# Patient Record
Sex: Female | Born: 1960 | Race: White | Hispanic: No | Marital: Married | Smoking: Current every day smoker
Health system: Southern US, Community
[De-identification: ages and names within clinical notes are randomized; demographics above are authoritative.]

## PROBLEM LIST (undated history)

## (undated) DIAGNOSIS — R112 Nausea with vomiting, unspecified: Secondary | ICD-10-CM

## (undated) DIAGNOSIS — Z9889 Other specified postprocedural states: Secondary | ICD-10-CM

## (undated) DIAGNOSIS — K219 Gastro-esophageal reflux disease without esophagitis: Secondary | ICD-10-CM

## (undated) DIAGNOSIS — K2210 Ulcer of esophagus without bleeding: Secondary | ICD-10-CM

---

## 1981-09-09 HISTORY — PX: CERVICAL CONIZATION W/BX: SHX1330

## 2001-04-08 ENCOUNTER — Ambulatory Visit (HOSPITAL_COMMUNITY): Admission: RE | Admit: 2001-04-08 | Discharge: 2001-04-08 | Payer: Self-pay | Admitting: Specialist

## 2001-04-08 ENCOUNTER — Encounter: Payer: Self-pay | Admitting: Specialist

## 2001-04-16 ENCOUNTER — Encounter: Payer: Self-pay | Admitting: Specialist

## 2001-04-16 ENCOUNTER — Ambulatory Visit (HOSPITAL_COMMUNITY): Admission: RE | Admit: 2001-04-16 | Discharge: 2001-04-16 | Payer: Self-pay | Admitting: Specialist

## 2001-04-22 ENCOUNTER — Other Ambulatory Visit: Admission: RE | Admit: 2001-04-22 | Discharge: 2001-04-22 | Payer: Self-pay | Admitting: Specialist

## 2002-04-19 ENCOUNTER — Ambulatory Visit (HOSPITAL_COMMUNITY): Admission: RE | Admit: 2002-04-19 | Discharge: 2002-04-19 | Payer: Self-pay | Admitting: Specialist

## 2002-04-19 ENCOUNTER — Encounter: Payer: Self-pay | Admitting: Specialist

## 2003-04-21 ENCOUNTER — Encounter: Payer: Self-pay | Admitting: Obstetrics & Gynecology

## 2003-04-21 ENCOUNTER — Ambulatory Visit (HOSPITAL_COMMUNITY): Admission: RE | Admit: 2003-04-21 | Discharge: 2003-04-21 | Payer: Self-pay | Admitting: Obstetrics & Gynecology

## 2003-05-24 ENCOUNTER — Emergency Department (HOSPITAL_COMMUNITY): Admission: EM | Admit: 2003-05-24 | Discharge: 2003-05-24 | Payer: Self-pay | Admitting: Emergency Medicine

## 2004-04-24 ENCOUNTER — Ambulatory Visit (HOSPITAL_COMMUNITY): Admission: RE | Admit: 2004-04-24 | Discharge: 2004-04-24 | Payer: Self-pay | Admitting: Obstetrics & Gynecology

## 2005-04-25 ENCOUNTER — Ambulatory Visit (HOSPITAL_COMMUNITY): Admission: RE | Admit: 2005-04-25 | Discharge: 2005-04-25 | Payer: Self-pay | Admitting: Obstetrics & Gynecology

## 2005-05-15 ENCOUNTER — Ambulatory Visit (HOSPITAL_COMMUNITY): Admission: RE | Admit: 2005-05-15 | Discharge: 2005-05-15 | Payer: Self-pay | Admitting: Obstetrics & Gynecology

## 2005-07-29 ENCOUNTER — Ambulatory Visit (HOSPITAL_COMMUNITY): Admission: RE | Admit: 2005-07-29 | Discharge: 2005-07-29 | Payer: Self-pay | Admitting: Family Medicine

## 2006-05-16 ENCOUNTER — Encounter: Admission: RE | Admit: 2006-05-16 | Discharge: 2006-05-16 | Payer: Self-pay | Admitting: Obstetrics & Gynecology

## 2007-05-28 ENCOUNTER — Ambulatory Visit (HOSPITAL_COMMUNITY): Admission: RE | Admit: 2007-05-28 | Discharge: 2007-05-28 | Payer: Self-pay | Admitting: Obstetrics & Gynecology

## 2007-06-08 ENCOUNTER — Other Ambulatory Visit: Admission: RE | Admit: 2007-06-08 | Discharge: 2007-06-08 | Payer: Self-pay | Admitting: Obstetrics & Gynecology

## 2008-05-31 ENCOUNTER — Ambulatory Visit (HOSPITAL_COMMUNITY): Admission: RE | Admit: 2008-05-31 | Discharge: 2008-05-31 | Payer: Self-pay | Admitting: Obstetrics & Gynecology

## 2009-06-27 ENCOUNTER — Ambulatory Visit (HOSPITAL_COMMUNITY): Admission: RE | Admit: 2009-06-27 | Discharge: 2009-06-27 | Payer: Self-pay | Admitting: Obstetrics & Gynecology

## 2009-06-30 ENCOUNTER — Other Ambulatory Visit: Admission: RE | Admit: 2009-06-30 | Discharge: 2009-06-30 | Payer: Self-pay | Admitting: Obstetrics & Gynecology

## 2010-11-19 ENCOUNTER — Encounter (HOSPITAL_COMMUNITY): Payer: Self-pay

## 2010-11-19 ENCOUNTER — Ambulatory Visit (HOSPITAL_COMMUNITY)
Admission: RE | Admit: 2010-11-19 | Discharge: 2010-11-19 | Disposition: A | Discharge status: Home / Self Care | Payer: BC Managed Care – PPO | Source: Ambulatory Visit | Attending: Family Medicine | Admitting: Family Medicine

## 2010-11-19 ENCOUNTER — Other Ambulatory Visit (HOSPITAL_COMMUNITY): Payer: Self-pay | Admitting: Family Medicine

## 2010-11-19 DIAGNOSIS — R109 Unspecified abdominal pain: Secondary | ICD-10-CM | POA: Insufficient documentation

## 2010-11-19 DIAGNOSIS — R140 Abdominal distension (gaseous): Secondary | ICD-10-CM

## 2010-11-19 DIAGNOSIS — R142 Eructation: Secondary | ICD-10-CM | POA: Insufficient documentation

## 2010-11-19 DIAGNOSIS — R141 Gas pain: Secondary | ICD-10-CM | POA: Insufficient documentation

## 2011-01-03 ENCOUNTER — Ambulatory Visit (INDEPENDENT_AMBULATORY_CARE_PROVIDER_SITE_OTHER): Payer: BC Managed Care – PPO | Admitting: Internal Medicine

## 2011-01-03 DIAGNOSIS — R1031 Right lower quadrant pain: Secondary | ICD-10-CM

## 2011-01-11 ENCOUNTER — Ambulatory Visit (HOSPITAL_COMMUNITY)
Admission: RE | Admit: 2011-01-11 | Discharge: 2011-01-11 | Disposition: A | Discharge status: Home / Self Care | Payer: BC Managed Care – PPO | Source: Ambulatory Visit | Attending: Internal Medicine | Admitting: Internal Medicine

## 2011-01-11 ENCOUNTER — Other Ambulatory Visit (INDEPENDENT_AMBULATORY_CARE_PROVIDER_SITE_OTHER): Payer: Self-pay | Admitting: Internal Medicine

## 2011-01-11 ENCOUNTER — Encounter (HOSPITAL_BASED_OUTPATIENT_CLINIC_OR_DEPARTMENT_OTHER): Payer: BC Managed Care – PPO | Admitting: Internal Medicine

## 2011-01-11 DIAGNOSIS — K2940 Chronic atrophic gastritis without bleeding: Secondary | ICD-10-CM | POA: Insufficient documentation

## 2011-01-11 DIAGNOSIS — R141 Gas pain: Secondary | ICD-10-CM

## 2011-01-11 DIAGNOSIS — R933 Abnormal findings on diagnostic imaging of other parts of digestive tract: Secondary | ICD-10-CM | POA: Insufficient documentation

## 2011-01-11 DIAGNOSIS — K2960 Other gastritis without bleeding: Secondary | ICD-10-CM

## 2011-01-11 DIAGNOSIS — K449 Diaphragmatic hernia without obstruction or gangrene: Secondary | ICD-10-CM

## 2011-02-04 NOTE — Consult Note (Signed)
NAMENELY, DEDMON                 ACCOUNT NO.:  [id number]  MEDICAL RECORD NO.:  [id number]           PATIENT TYPE:  LOCATION:                                 FACILITY:  PHYSICIAN:  Lionel December, M.D.    DATE OF BIRTH:  1960-10-12  DATE OF CONSULTATION:  01/03/2011 DATE OF DISCHARGE:                                CONSULTATION   REASON FOR CONSULTATION:  Distended stomach.  HISTORY OF PRESENT ILLNESS:  [name] is a 50 year old female referred to our office by [facility].  She gives a history of seeing a chiropractor on November 08, 2010, and x-rays were taken.  She said she did have an abnormal x-ray.  The x-ray evidently showed a significant gastric distention.  There was a question of gastroparesis or partial gastric outlet obstruction.  The bowel gas pattern was otherwise normal. She says her appetite has been good.  There has been no weight loss.  No abdominal pain.  She does pass a lot of gas.  She normally has one bowel movement a day but now she is having a bowel movement every other day. She does say some of her stools are small.  She denies any melena or bright red rectal bleeding.  There has been no nausea or vomiting.  No weight loss.  No fatigue.  She does say at times, her stomach is distended.  MEDICATIONS:  Her medications include fish oil 1000 mg one a day, vitamin C 500 a day, antacid as needed, Colon Health one a day, vitamin D3 2000 mg a day, calcium 600 mg a day,  Allergy Relief as needed, nasal spray as needed, Dulcolax p.r.n., milk of magnesia p.r.n., and Aleve p.r.n.  ALLERGIES:  There is no known allergies.  PAST SURGICAL HISTORY:  In 1987, she had a conization of the cervix for precancerous lesions.  No medical history.  FAMILY HISTORY:  Her father is 62 years old and in good health.  Her mother is alive with a CVA and a cardiac stent, one sister in good health.  SOCIAL HISTORY:  She is married.  She works at __________ company  in [place].  She smokes one pack a day for 30 years.  No children.  OBJECTIVE:  VITAL SIGNS:  Her weight is 124, height 5 feet 3 inches, temperature 98.7, blood pressure 130/82, and her pulse is 82.  Her last documented weight in March 2012 was 122, so she has gained 2 pounds. HEENT:  She has natural teeth.  Her oral mucosa is moist.  There are no lesions.  Conjunctivae are pink.  Sclerae anicteric. NECK:  Thyroid is normal.  There is no cervical lymphadenopathy. LUNGS:  Clear. HEART:  Regular rate and rhythm. ABDOMEN:  Soft and nondistended.  Bowel sounds positive.  No masses.  LABORATORY DATA:  Her abdominal one view x-ray revealed there was rather marked gastric distention.  This raises the question of gastroparesis or partial gastric outlet obstruction.  The bowel gas pattern was otherwise normal. The impression was significant gastric distention, otherwise unremarkable.  Her sodium on November 19, 2010, of 144, potassium 3.9,  chloride 106, glucose 96, BUN 10, creatinine 0.7, total bilirubin 0.4, ALP 55, AST 19, ALT 8, total protein 6.8, albumin 4.6, hemoglobin 15.2.  She is a smoker hematocrit is 45.1, WBC count is elevated at 13.5, granulocytes 79, absolute granulocytes 10.7.  ASSESSMENT:  Madison Walsh is a 50 year old female presenting today with an abnormal x-ray which showed significant gastric distention.  A partial gastric outlet obstruction needs to be ruled out as well as gastroparesis.  I did discuss this case with Dr. Karilyn Cota, and he would like to proceed with an EGD.    ______________________________ Dorene Ar, NP   ______________________________ Lionel December, M.D.    TS/MEDQ  D:  01/03/2011  T:  01/04/2011  Job:  [id number]  cc:   Dr. [facility]  Electronically Signed by Dorene Ar PA on 01/10/2011 09:27:29 AM Electronically Signed by Lionel December M.D. on 02/04/2011 05:39:59 PM

## 2011-02-04 NOTE — Op Note (Signed)
NAMEHONORE, WIPPERFURTH                 ACCOUNT NO.:  [id number]  MEDICAL RECORD NO.:  [id number]           PATIENT TYPE:  O  LOCATION:  [facility]                          FACILITY:  [facility]  PHYSICIAN:  Lionel December, M.D.    DATE OF BIRTH:  02-Jan-1961  DATE OF PROCEDURE:  01/11/2011 DATE OF DISCHARGE:                              OPERATIVE REPORT   PROCEDURE:  Esophagogastroduodenoscopy.  INDICATIONS:  Madison Walsh is 50 year old Caucasian female with recurrent postprandial bloating who had abdominal CT which revealed very dilated stomach.  She is therefore referred for further evaluation.  She denies abdominal pain.  She has had problems with constipation, but she is able to control with diet, fiber supplement, and p.r.n. use of laxatives. She has heartburn, but not very frequently.  She is undergoing diagnostic EGD.  Procedures were reviewed with the patient.  Informed consent was obtained.  MEDS FOR CONSCIOUS SEDATION:  Cetacaine spray for oropharyngeal topical anesthesia, Demerol 25 mg IV, Versed 6 mg IV.  FINDINGS:  Procedure performed in endoscopy suite.  The patient's vital signs and O2 sat were monitored during the procedure and remained stable.  The patient was placed in left lateral recumbent position and Pentax videoscope was passed through oropharynx without any difficulty into esophagus.  Esophagus.  Mucosa of the proximal middle third was normal.  Distally, there were 3 linear erosion extending to GE junction, one of these erosions about a centimeter long, another one was even longer, and the third one was small.  No ring or stricture was noted.  GE junction was at 34 cm from the incisors and hiatus was at 36.  Stomach.  It was empty and distended very well by insufflation.  Folds of proximal stomach are normal.  Examination of mucosa at body was normal.  In the antrum, there was some patchy erythema, small scar towards lesser curvature.  In the prepyloric region, there was  heaping up of mucosa with almost with appearance of polyp with a scar on the site.  This process extended to the right side of the pyloric channel, but pyloric channel was patent.  Biopsy was taken from this area. Angularis, fundus, and cardia were examined by retroflexion of scope and were normal.  Duodenum.  Bulbar mucosa was normal.  Scope was passed in second part of duodenal mucosa and folds were normal.  Endoscope was withdrawn.  The patient tolerated the procedure well.  FINAL DIAGNOSES: 1. Erosive reflux esophagitis with 2-cm size sliding hiatal hernia. 2. Antral gastritis with a scar and mucosal edema and scarring in the     prepyloric region with appearance of polyp.  This area was     biopsied. 3. Endoscopically, appears to be benign process.  RECOMMENDATIONS: 1. Antireflux measures. 2. We will start the patient on pantoprazole 40 mg p.o. q.a.m.     prescription given for 30 with 5 refills. 3. I will be contacting patient with results of biopsy and further     recommendations.  If this special stains were negative for H.    pylori, we will follow it up with H. pylori serology.  ______________________________ Lionel December, M.D.     NR/MEDQ  D:  01/11/2011  T:  01/11/2011  Job:  [id number]  cc:   Madison Walsh. Sherwood Gambler, MD Fax: [phone]  Electronically Signed by Lionel December M.D. on 02/04/2011 05:40:36 PM

## 2011-04-03 ENCOUNTER — Encounter (INDEPENDENT_AMBULATORY_CARE_PROVIDER_SITE_OTHER): Payer: Self-pay | Admitting: *Deleted

## 2011-04-03 ENCOUNTER — Telehealth (INDEPENDENT_AMBULATORY_CARE_PROVIDER_SITE_OTHER): Payer: Self-pay | Admitting: *Deleted

## 2011-04-03 ENCOUNTER — Other Ambulatory Visit (INDEPENDENT_AMBULATORY_CARE_PROVIDER_SITE_OTHER): Payer: Self-pay | Admitting: *Deleted

## 2011-04-03 DIAGNOSIS — Z800 Family history of malignant neoplasm of digestive organs: Secondary | ICD-10-CM

## 2011-04-03 DIAGNOSIS — Z139 Encounter for screening, unspecified: Secondary | ICD-10-CM

## 2011-04-03 NOTE — Telephone Encounter (Signed)
Madison Walsh called with a progress report. States that she had a EGD 8 weeks ago , and she was told that she had GERD, several ulcers in her esophagus. She says that she cannot tell that she is that much better,her stomach wakes her up due to the pain that she has. Currently taking Pantoprazole 40 mg 1 every morning. She also ask about having a TCS done as she is 50 years of age, I did transfer her to [place] to address.

## 2011-04-03 NOTE — Telephone Encounter (Signed)
TCS sch'd 06/27/11 @ 12:00 (11:00), pt aware, movi prep instr mailed to pt

## 2011-04-16 NOTE — Telephone Encounter (Signed)
Patient is scheduled for colonoscopy; We'll reassess her symptoms at that time.

## 2011-06-25 ENCOUNTER — Other Ambulatory Visit (INDEPENDENT_AMBULATORY_CARE_PROVIDER_SITE_OTHER): Payer: Self-pay | Admitting: *Deleted

## 2011-06-25 DIAGNOSIS — Z1211 Encounter for screening for malignant neoplasm of colon: Secondary | ICD-10-CM

## 2011-06-25 DIAGNOSIS — Z800 Family history of malignant neoplasm of digestive organs: Secondary | ICD-10-CM

## 2011-06-26 MED ORDER — SODIUM CHLORIDE 0.45 % IV SOLN
Freq: Once | INTRAVENOUS | Status: AC
  Administered 2011-06-27: 12:00:00 via INTRAVENOUS

## 2011-06-27 ENCOUNTER — Encounter (HOSPITAL_COMMUNITY): Payer: Self-pay | Admitting: *Deleted

## 2011-06-27 ENCOUNTER — Ambulatory Visit (HOSPITAL_COMMUNITY)
Admission: RE | Admit: 2011-06-27 | Discharge: 2011-06-27 | Disposition: A | Discharge status: Home / Self Care | Payer: BC Managed Care – PPO | Source: Ambulatory Visit | Attending: Internal Medicine | Admitting: Internal Medicine

## 2011-06-27 ENCOUNTER — Other Ambulatory Visit (INDEPENDENT_AMBULATORY_CARE_PROVIDER_SITE_OTHER): Payer: Self-pay | Admitting: Internal Medicine

## 2011-06-27 ENCOUNTER — Encounter (HOSPITAL_COMMUNITY)
Admission: RE | Disposition: A | Discharge status: Home / Self Care | Payer: Self-pay | Source: Ambulatory Visit | Attending: Internal Medicine

## 2011-06-27 DIAGNOSIS — D126 Benign neoplasm of colon, unspecified: Secondary | ICD-10-CM | POA: Insufficient documentation

## 2011-06-27 DIAGNOSIS — Z800 Family history of malignant neoplasm of digestive organs: Secondary | ICD-10-CM

## 2011-06-27 DIAGNOSIS — Z1211 Encounter for screening for malignant neoplasm of colon: Secondary | ICD-10-CM

## 2011-06-27 HISTORY — PX: COLONOSCOPY: SHX5424

## 2011-06-27 HISTORY — DX: Nausea with vomiting, unspecified: R11.2

## 2011-06-27 HISTORY — DX: Nausea with vomiting, unspecified: Z98.890

## 2011-06-27 HISTORY — DX: Gastro-esophageal reflux disease without esophagitis: K21.9

## 2011-06-27 SURGERY — COLONOSCOPY
Anesthesia: Moderate Sedation

## 2011-06-27 MED ORDER — MEPERIDINE HCL 50 MG/ML IJ SOLN
INTRAMUSCULAR | Status: DC | PRN
Start: 2011-06-27 — End: 2011-06-27
  Administered 2011-06-27 (×2): 25 mg

## 2011-06-27 MED ORDER — MIDAZOLAM HCL 5 MG/5ML IJ SOLN
INTRAMUSCULAR | Status: AC
  Filled 2011-06-27: qty 10

## 2011-06-27 MED ORDER — MIDAZOLAM HCL 5 MG/5ML IJ SOLN
INTRAMUSCULAR | Status: DC | PRN
  Administered 2011-06-27: 2 mg via INTRAVENOUS
  Administered 2011-06-27 (×2): 1 mg via INTRAVENOUS
  Administered 2011-06-27: 2 mg via INTRAVENOUS
  Administered 2011-06-27: 1 mg via INTRAVENOUS

## 2011-06-27 MED ORDER — MEPERIDINE HCL 50 MG/ML IJ SOLN
INTRAMUSCULAR | Status: AC
  Filled 2011-06-27: qty 1

## 2011-06-27 NOTE — H&P (Signed)
Madison Walsh is an 50 y.o. female.   Chief Complaint: Patient is here for colonoscopy HPI: Patient is 50 year old Caucasian female who is in for average risk screening colonoscopy. She has chronic constipation which is controlled with fiber supplement and when necessary use of OTC laxatives. She denies melena or rectal bleeding or abdominal pain. Family history is positive for colon carcinoma in a paternal uncle who died of soon after  being diagnosed at age 34 of metastatic disease   Past Medical History  Diagnosis Date  . GERD (gastroesophageal reflux disease)   . Constipation   . PONV (postoperative nausea and vomiting)     Past Surgical History  Procedure Date  . Cervical conization w/bx 1983    Family History  Problem Relation Age of Onset  . Colon cancer Maternal Uncle    Social History:  reports that she has been smoking Cigarettes.  She has a 30 pack-year smoking history. She does not have any smokeless tobacco history on file. She reports that she does not drink alcohol or use illicit drugs.  Allergies: No Known Allergies  Medications Prior to Admission  Medication Dose Route Frequency Provider Last Rate Last Dose  . 0.45 % sodium chloride infusion   Intravenous Once Malissa Hippo, MD      . meperidine (DEMEROL) 50 MG/ML injection           . midazolam (VERSED) 5 MG/5ML injection            Medications Prior to Admission  Medication Sig Dispense Refill  . magnesium hydroxide (MILK OF MAGNESIA) 400 MG/5ML suspension Take 15 mLs by mouth daily as needed. For constipation       . pantoprazole (PROTONIX) 40 MG tablet Take 40 mg by mouth daily.        . naproxen sodium (ANAPROX) 220 MG tablet Take 220 mg by mouth every 8 (eight) hours as needed. For pain         No results found for this or any previous visit (from the past 48 hour(s)). No results found.  Review of Systems  Constitutional: Negative for weight loss.  Gastrointestinal: Negative for abdominal pain,  diarrhea, constipation, blood in stool and melena.    Blood pressure 122/62, pulse 67, temperature 98.6 F (37 C), temperature source Oral, resp. rate 22, height 5\' 3"  (1.6 m), weight 120 lb (54.432 kg), last menstrual period 06/21/2011, SpO2 99.00%. Physical Exam  Constitutional: She is oriented to person, place, and time. She appears well-developed and well-nourished.  HENT:  Mouth/Throat: Oropharynx is clear and moist.  Eyes: Conjunctivae are normal. No scleral icterus.  Neck: No thyromegaly present.  Cardiovascular: Normal rate, regular rhythm and normal heart sounds.   No murmur heard. Respiratory: Effort normal and breath sounds normal.  GI: Soft. Bowel sounds are normal. She exhibits no distension and no mass. There is no tenderness.  Musculoskeletal: She exhibits no edema.  Lymphadenopathy:    She has no cervical adenopathy.  Neurological: She is alert and oriented to person, place, and time.  Skin: Skin is warm and dry.     Assessment/Plan Average risk screening colonoscopy  REHMAN,NAJEEB U 06/27/2011, 11:54 AM

## 2011-06-27 NOTE — Op Note (Signed)
COLONOSCOPY PROCEDURE REPORT  PATIENT:  Madison Walsh  MR#:  [id number] Birthdate:  1961/05/14, 50 y.o., female Endoscopist:  Dr. Malissa Hippo, MD Referred By:  Roselyn Reef, PA, [facility] Procedure Date: 06/27/2011  Procedure:   Colonoscopy with snare polypectomy  Indications:  Patient is 50 year old Caucasian female who is here for average risk screening colonoscopy. She had a paternal uncle died of colon carcinoma at age 76 but she does not have a first degree relative with history of CRC.  Informed Consent:  Procedure and risks were reviewed with the patient and informed consent obtained  Medications:  Demerol 50 mg IV Versed 7 mg IV  Description of procedure:  After a digital rectal exam was performed, that colonoscope was advanced from the anus through the rectum and colon to the area of the cecum, ileocecal valve and appendiceal orifice. The cecum was deeply intubated. These structures were well-seen and photographed for the record. From the level of the cecum and ileocecal valve, the scope was slowly and cautiously withdrawn. The mucosal surfaces were carefully surveyed utilizing scope tip to flexion to facilitate fold flattening as needed. The scope was pulled down into the rectum where a thorough exam including retroflexion was performed.  Findings:   Prep excellent. Redundant and tortuous colon. 3 hyperplastic appearing polyps at splenic flexure which were coagulated using snare tip. 10 mm polyp at distal sigmoid colon. Was snared and retrieved for histologic examination Ano-rectal junction was unremarkable.  Therapeutic/Diagnostic Maneuvers Performed:  See above  Complications:  None  Cecal Withdrawal Time:  12 minutes  Impression:  Examination performed to cecum. Redundant  and tortuous colon. Three  small hyperplastic appearing polyps at splenic flexure were coagulated. 10 mm polyp snared from sigmoid colon.  Recommendations:  No aspirin for 10 days I  will be contacting patient with results of biopsy and further recommendations.  REHMAN,NAJEEB U  06/27/2011 12:50 PM  CC: Dr. Shawnie Dapper, PA, PA-C & Dr. Bonnetta Barry ref. provider found

## 2011-07-01 ENCOUNTER — Encounter (INDEPENDENT_AMBULATORY_CARE_PROVIDER_SITE_OTHER): Payer: Self-pay | Admitting: *Deleted

## 2011-07-04 ENCOUNTER — Encounter (HOSPITAL_COMMUNITY): Payer: Self-pay | Admitting: Internal Medicine

## 2013-03-04 ENCOUNTER — Telehealth (INDEPENDENT_AMBULATORY_CARE_PROVIDER_SITE_OTHER): Payer: Self-pay | Admitting: *Deleted

## 2013-03-04 NOTE — Telephone Encounter (Signed)
Virl Diamond has scheduled an apt for 03/08/13 with Dorene Ar, NP. He would like to know what they can do for constipated until her apt on Monday. The return phone number is [phone].

## 2013-03-04 NOTE — Telephone Encounter (Signed)
No BM in 5 days. Try a fleets enema. Hold as long as possible.

## 2013-03-08 ENCOUNTER — Ambulatory Visit (INDEPENDENT_AMBULATORY_CARE_PROVIDER_SITE_OTHER): Payer: BC Managed Care – PPO | Admitting: Internal Medicine

## 2013-03-08 ENCOUNTER — Encounter (INDEPENDENT_AMBULATORY_CARE_PROVIDER_SITE_OTHER): Payer: Self-pay | Admitting: Internal Medicine

## 2013-03-08 ENCOUNTER — Telehealth (INDEPENDENT_AMBULATORY_CARE_PROVIDER_SITE_OTHER): Payer: Self-pay | Admitting: Internal Medicine

## 2013-03-08 VITALS — BP 100/72 | HR 68 | Ht 63.0 in | Wt 122.1 lb

## 2013-03-08 DIAGNOSIS — K2210 Ulcer of esophagus without bleeding: Secondary | ICD-10-CM

## 2013-03-08 DIAGNOSIS — K5900 Constipation, unspecified: Secondary | ICD-10-CM | POA: Insufficient documentation

## 2013-03-08 NOTE — Telephone Encounter (Signed)
Called this am. States she feels better. Had fairly good BM. She is to be seen today in our office.

## 2013-03-08 NOTE — Progress Notes (Signed)
Subjective:     Patient ID: Madison Walsh, female   DOB: 03-06-1961, 52 y.o.   MRN: [id number]  HPI Presents today with c/o that she became constipated. Wednesday or Thursday of last week she could not burp, eat, or pass gas. She had not had a BM x 6 days. She had tried [employer] softener, MOM, and dulcolax without a BM. She took a Fleet's enema last Thursday and she had some results. She is presently not on a PPI. Was taking Protonix but has not taken for 2 yrs. By Saturday she had good results. She began to feel better. She had a BM yesterday and today.  She tells me she feels better. She says she feel acid reflux.  When she bends over she becomes nauseated.  Appetite is better.  2012 EGD: 1. Erosive reflux esophagitis with 2-cm size sliding hiatal hernia.  2. Antral gastritis with a scar and mucosal edema and scarring in the  prepyloric region with appearance of polyp. This area was  biopsied.  3. Endoscopically, appears to be benign process.     06/27/2011 Colonoscopy: Impression:  Examination performed to cecum.  Redundant and tortuous colon.  Three small hyperplastic appearing polyps at splenic flexure were coagulated.  10 mm polyp snared from sigmoid colon.  Review of Systems see hpi Current Outpatient Prescriptions  Medication Sig Dispense Refill  . magnesium hydroxide (MILK OF MAGNESIA) 400 MG/5ML suspension Take 15 mLs by mouth daily as needed. For constipation       . naproxen sodium (ANAPROX) 220 MG tablet Take 220 mg by mouth every 8 (eight) hours as needed. For pain        No current facility-administered medications for this visit.   Past Medical History  Diagnosis Date  . GERD (gastroesophageal reflux disease)   . Constipation   . PONV (postoperative nausea and vomiting)    Past Surgical History  Procedure Laterality Date  . Cervical conization w/bx  1983  . Colonoscopy  06/27/2011    Procedure: COLONOSCOPY;  Surgeon: Malissa Hippo, MD;  Location: [facility]  END[facility] SUITE;  Service: Endoscopy;  Laterality: N/A;  12:00   No Known Allergies      Objective:   Physical Exam  Filed Vitals:   03/08/13 1115  BP: 100/72  Pulse: 68  Height: 5\' 3"  (1.6 m)  Weight: 122 lb 1.6 oz (55.384 kg)  Alert and oriented. Skin warm and dry. Oral mucosa is moist.   . Sclera anicteric, conjunctivae is pink. Thyroid not enlarged. No cervical lymphadenopathy. Lungs clear. Heart regular rate and rhythm.  Abdomen is soft. Bowel sounds are positive. No hepatomegaly. No abdominal masses felt. No tenderness.  No edema to lower extremities        Assessment:    Constipation resolved.  Colonoscopy UTD   Erosive esophagitis: she has been off the Protonix for approximately 2 yrs. GERD not controlled at this time. Plan:   Dexilant samples x 2 boxes given to patient. PR in 2 weeks. If better, will e prescribe to her pharmacy. She needs to be on a PPI.

## 2013-03-08 NOTE — Patient Instructions (Addendum)
Dexilant samples given to patient. PR in 2 weeks. Must be on a PPI due to erosive esohpagitis.

## 2013-03-18 ENCOUNTER — Telehealth (INDEPENDENT_AMBULATORY_CARE_PROVIDER_SITE_OTHER): Payer: Self-pay | Admitting: *Deleted

## 2013-03-18 ENCOUNTER — Encounter (INDEPENDENT_AMBULATORY_CARE_PROVIDER_SITE_OTHER): Payer: Self-pay | Admitting: Internal Medicine

## 2013-03-18 NOTE — Telephone Encounter (Signed)
Was given Dexilant samples to try by Dorene Ar, NP. Her symptoms are better but no gone. Please ask Camelia Eng is she would like to keep her on this and if so she will need an Rx sent to her pharmacy. She will be out as of today. If not should she try Nexium? Her return phone number is [phone].

## 2013-03-19 NOTE — Telephone Encounter (Signed)
Tammy has spoken with Dr. Karilyn Cota and this has been addressed

## 2013-07-15 ENCOUNTER — Other Ambulatory Visit: Payer: Self-pay

## 2013-09-07 ENCOUNTER — Encounter (INDEPENDENT_AMBULATORY_CARE_PROVIDER_SITE_OTHER): Payer: Self-pay | Admitting: Internal Medicine

## 2013-09-07 ENCOUNTER — Ambulatory Visit (INDEPENDENT_AMBULATORY_CARE_PROVIDER_SITE_OTHER): Payer: BC Managed Care – PPO | Admitting: Internal Medicine

## 2013-09-07 VITALS — BP 102/50 | HR 72 | Temp 98.8°F | Ht 63.0 in | Wt 127.1 lb

## 2013-09-07 DIAGNOSIS — K219 Gastro-esophageal reflux disease without esophagitis: Secondary | ICD-10-CM

## 2013-09-07 DIAGNOSIS — K5900 Constipation, unspecified: Secondary | ICD-10-CM

## 2013-09-07 NOTE — Progress Notes (Signed)
Subjective:     Patient ID: Madison Walsh, female   DOB: Mar 16, 1961, 52 y.o.   MRN: [id number]  HPI Here today for f/u. She says she occasionally has acid reflux. She says it much better.  If she has GERD, she will take a Tums. For constipation she uses a stool softener on a daily. She usually has a BM daily. Appetite is good. No weight loss. No abdominal pain. No melena or bright red rectal bleeding.  She is trying to exercise.    2012 EGD:  1. Erosive reflux esophagitis with 2-cm size sliding hiatal hernia.  2. Antral gastritis with a scar and mucosal edema and scarring in the  prepyloric region with appearance of polyp. This area was  biopsied.  3. Endoscopically, appears to be benign process.  06/27/2011 Colonoscopy: Impression:  Examination performed to cecum.  Redundant and tortuous colon.  Three small hyperplastic appearing polyps at splenic flexure were coagulated.  10 mm polyp snared from sigmoid colon.  ------  Notes Recorded by Malissa Hippo, MD on 06/30/2011 at 1:21 PM Biopsy results reviewed with patient's husband. 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 Next colonoscopy would be in 5 years.   Review of Systems  See hpi Current Outpatient Prescriptions  Medication Sig Dispense Refill  . docusate sodium (COLACE) 100 MG capsule Take 100 mg by mouth daily.      . magnesium hydroxide (MILK OF MAGNESIA) 400 MG/5ML suspension Take 15 mLs by mouth daily as needed. For constipation       . naproxen sodium (ANAPROX) 220 MG tablet Take 220 mg by mouth every 8 (eight) hours as needed. For pain        No current facility-administered medications for this visit.   Past Medical History  Diagnosis Date  . GERD (gastroesophageal reflux disease)   . Constipation   . PONV (postoperative nausea and vomiting)    Past Surgical History  Procedure Laterality Date  . Cervical conization w/bx  1983  . Colonoscopy  06/27/2011    Procedure: COLONOSCOPY;  Surgeon: Malissa Hippo, MD;  Location:  [facility];  Service: Endoscopy;  Laterality: N/A;  12:00   No Known Allergies      Objective:   Physical Exam  Filed Vitals:   09/07/13 0927  BP: 102/50  Pulse: 72  Temp: 98.8 F (37.1 C)  Height: 5\' 3"  (1.6 m)  Weight: 127 lb 1.6 oz (57.652 kg)   Alert and oriented. Skin warm and dry. Oral mucosa is moist.   . Sclera anicteric, conjunctivae is pink. Thyroid not enlarged. No cervical lymphadenopathy. Lungs clear. Heart regular rate and rhythm.  Abdomen is soft. Bowel sounds are positive. No hepatomegaly. No abdominal masses felt. No tenderness.  No edema to lower extremities.       Assessment:    GERD. Controlled at this time. Tums prn. She avoids greasy foods.  She does have HOB elevated. Constipation: She uses a stool softener daily.    Plan:   OV in 1 yr with Dr. Karilyn Cota. Advised to increase fiber in diet. Exercise

## 2013-09-07 NOTE — Patient Instructions (Signed)
OV in 1 yr with Dr. Karilyn Cota. Keep HOB elevated

## 2014-06-24 ENCOUNTER — Other Ambulatory Visit: Payer: Self-pay

## 2014-06-29 ENCOUNTER — Encounter (INDEPENDENT_AMBULATORY_CARE_PROVIDER_SITE_OTHER): Payer: Self-pay | Admitting: *Deleted

## 2014-09-27 ENCOUNTER — Encounter (INDEPENDENT_AMBULATORY_CARE_PROVIDER_SITE_OTHER): Payer: Self-pay | Admitting: Internal Medicine

## 2014-09-27 ENCOUNTER — Ambulatory Visit (INDEPENDENT_AMBULATORY_CARE_PROVIDER_SITE_OTHER): Payer: 59 | Admitting: Internal Medicine

## 2014-09-27 VITALS — BP 108/70 | HR 74 | Temp 98.3°F | Resp 18 | Ht 63.0 in | Wt 130.2 lb

## 2014-09-27 DIAGNOSIS — Z86010 Personal history of colonic polyps: Secondary | ICD-10-CM

## 2014-09-27 DIAGNOSIS — K2100 Gastro-esophageal reflux disease with esophagitis, without bleeding: Secondary | ICD-10-CM

## 2014-09-27 MED ORDER — RANITIDINE HCL 150 MG PO TABS: 150.0000 mg | ORAL_TABLET | Freq: Every day | ORAL | Status: DC | PRN

## 2014-09-27 NOTE — Patient Instructions (Addendum)
Call if Zantac OTC quits working. Decrease coffee intake to 2-3 cups per day. Next colonoscopy in October 2017.

## 2014-09-27 NOTE — Progress Notes (Signed)
Presenting complaint;  Follow-up for GERD and constipation.  Subjective:  Patient is 54 year old Caucasian female who is here for scheduled visit. She was last seen in December 2014. She has history of erosive reflux esophagitis, constipation and history of colonic adenomas. She has been off PPI for over a year. She is using OTC Zantac 2-3 times a month. She stays of a from fried and fatty foods. She does drink 4-5 cups of coffee a day. She denies sore throat and hoarseness chronic cough but does have postnasal drip. She also denies dysphagia. She has very good appetite. She is consuming fiber rich foods. She states stools off for helps and she uses milk of magnesia no more than once a month or once every 2 months. She has a bowel movement on most days. She is still smoking cigarettes about a pack a day.   Current Medications: Outpatient Encounter Prescriptions as of 09/27/2014  Medication Sig  . Calcium Citrate-Vitamin D (CALCIUM CITRATE + D PO) Take by mouth daily.  . Cholecalciferol (VITAMIN D) 2000 UNITS tablet Take 2,000 Units by mouth daily.  Marland Kitchen docusate sodium (COLACE) 100 MG capsule Take 100 mg by mouth daily.  . magnesium hydroxide (MILK OF MAGNESIA) 400 MG/5ML suspension Take 15 mLs by mouth daily as needed. For constipation   . [DISCONTINUED] naproxen sodium (ANAPROX) 220 MG tablet Take 220 mg by mouth every 8 (eight) hours as needed. For pain      Objective: Blood pressure 108/70, pulse 74, temperature 98.3 F (36.8 C), temperature source Oral, resp. rate 18, height 5\' 3"  (1.6 m), weight 130 lb 3.2 oz (59.058 kg). Patient is alert and in no acute distress. Conjunctiva is pink. Sclera is nonicteric Oropharyngeal mucosa is normal. No neck masses or thyromegaly noted. Cardiac exam with regular rhythm normal S1 and S2. No murmur or gallop noted. Lungs are clear to auscultation. Abdomen is symmetrical soft and nontender without organomegaly or masses.  No LE edema or clubbing  noted.   Assessment:  #1. Chronic GERD. She had EGD in May 2012 and was found to have erosive reflux esophagitis and stayed on PPI for over a year. She is using OTC H2 B along with dietary measures and seems to be doing well. She needs to decrease coffee intake to 2-3 cups a day. She also needs to quit cigarette smoking. #2. Chronic constipation. She is doing well with high fiber diet stool softener and occasional MOM. #3. History of colonic adenomas. Last colonoscopy was in October 2012.   Plan:  Patient advised to decrease coffee intake to 3 cups per day. He also needs to make an effort to quit cigarette smoking. If she cannot do it by herself she may review treatment options with PCP. Patient encouraged to call office if GERD symptoms are well-controlled with current therapy. Office visit on as-needed basis. Next colonoscopy in October 2017.

## 2014-10-03 ENCOUNTER — Other Ambulatory Visit: Payer: Self-pay | Admitting: Obstetrics & Gynecology

## 2014-10-04 LAB — CYTOLOGY - PAP

## 2015-07-06 ENCOUNTER — Ambulatory Visit (HOSPITAL_COMMUNITY)
Admission: RE | Admit: 2015-07-06 | Discharge: 2015-07-06 | Disposition: A | Discharge status: Home / Self Care | Payer: 59 | Source: Ambulatory Visit | Attending: Family Medicine | Admitting: Family Medicine

## 2015-07-06 ENCOUNTER — Other Ambulatory Visit (HOSPITAL_COMMUNITY): Payer: Self-pay | Admitting: Family Medicine

## 2015-07-06 DIAGNOSIS — R05 Cough: Secondary | ICD-10-CM

## 2015-07-06 DIAGNOSIS — K3189 Other diseases of stomach and duodenum: Secondary | ICD-10-CM | POA: Insufficient documentation

## 2015-07-06 DIAGNOSIS — F17200 Nicotine dependence, unspecified, uncomplicated: Secondary | ICD-10-CM | POA: Insufficient documentation

## 2015-07-06 DIAGNOSIS — R059 Cough, unspecified: Secondary | ICD-10-CM

## 2015-07-06 DIAGNOSIS — R0989 Other specified symptoms and signs involving the circulatory and respiratory systems: Secondary | ICD-10-CM | POA: Insufficient documentation

## 2015-07-06 DIAGNOSIS — R0602 Shortness of breath: Secondary | ICD-10-CM | POA: Insufficient documentation

## 2015-07-28 ENCOUNTER — Emergency Department (HOSPITAL_COMMUNITY): Payer: 59

## 2015-07-28 ENCOUNTER — Encounter (HOSPITAL_COMMUNITY): Payer: Self-pay

## 2015-07-28 ENCOUNTER — Emergency Department (HOSPITAL_COMMUNITY)
Admission: EM | Admit: 2015-07-28 | Discharge: 2015-07-28 | Disposition: A | Discharge status: Home / Self Care | Payer: 59 | Attending: Emergency Medicine | Admitting: Emergency Medicine

## 2015-07-28 DIAGNOSIS — F17210 Nicotine dependence, cigarettes, uncomplicated: Secondary | ICD-10-CM | POA: Diagnosis not present

## 2015-07-28 DIAGNOSIS — K219 Gastro-esophageal reflux disease without esophagitis: Secondary | ICD-10-CM | POA: Insufficient documentation

## 2015-07-28 DIAGNOSIS — R0789 Other chest pain: Secondary | ICD-10-CM

## 2015-07-28 DIAGNOSIS — R509 Fever, unspecified: Secondary | ICD-10-CM | POA: Diagnosis not present

## 2015-07-28 DIAGNOSIS — R079 Chest pain, unspecified: Secondary | ICD-10-CM | POA: Diagnosis present

## 2015-07-28 DIAGNOSIS — K5900 Constipation, unspecified: Secondary | ICD-10-CM | POA: Insufficient documentation

## 2015-07-28 DIAGNOSIS — R112 Nausea with vomiting, unspecified: Secondary | ICD-10-CM | POA: Insufficient documentation

## 2015-07-28 DIAGNOSIS — R197 Diarrhea, unspecified: Secondary | ICD-10-CM | POA: Diagnosis not present

## 2015-07-28 HISTORY — DX: Ulcer of esophagus without bleeding: K22.10

## 2015-07-28 LAB — COMPREHENSIVE METABOLIC PANEL
ALK PHOS: 59 U/L (ref 38–126)
ALT: 10 U/L — ABNORMAL LOW (ref 14–54)
ANION GAP: 8 (ref 5–15)
AST: 19 U/L (ref 15–41)
Albumin: 3.8 g/dL (ref 3.5–5.0)
BILIRUBIN TOTAL: 0.5 mg/dL (ref 0.3–1.2)
BUN: 10 mg/dL (ref 6–20)
CALCIUM: 9.2 mg/dL (ref 8.9–10.3)
CO2: 26 mmol/L (ref 22–32)
Chloride: 104 mmol/L (ref 101–111)
Creatinine, Ser: 0.61 mg/dL (ref 0.44–1.00)
GFR calc non Af Amer: >60 mL/min (ref >60)
Glucose, Bld: 115 mg/dL — ABNORMAL HIGH (ref 65–99)
POTASSIUM: 3.8 mmol/L (ref 3.5–5.1)
SODIUM: 138 mmol/L (ref 135–145)
TOTAL PROTEIN: 6.8 g/dL (ref 6.5–8.1)

## 2015-07-28 LAB — TROPONIN I

## 2015-07-28 LAB — CBC
HEMATOCRIT: 43.6 % (ref 36.0–46.0)
HEMOGLOBIN: 15 g/dL (ref 12.0–15.0)
MCH: 31.7 pg (ref 26.0–34.0)
MCHC: 34.4 g/dL (ref 30.0–36.0)
MCV: 92.2 fL (ref 78.0–100.0)
Platelets: 210 10*3/uL (ref 150–400)
RBC: 4.73 MIL/uL (ref 3.87–5.11)
RDW: 14.3 % (ref 11.5–15.5)
WBC: 8.8 10*3/uL (ref 4.0–10.5)

## 2015-07-28 LAB — PROTIME-INR
INR: 1.11 (ref 0.00–1.49)
PROTHROMBIN TIME: 14.5 s (ref 11.6–15.2)

## 2015-07-28 LAB — D-DIMER, QUANTITATIVE: D-Dimer, Quant: 0.87 ug/mL-FEU — ABNORMAL HIGH (ref 0.00–0.50)

## 2015-07-28 LAB — MAGNESIUM: Magnesium: 1.9 mg/dL (ref 1.7–2.4)

## 2015-07-28 MED ORDER — IOHEXOL 350 MG/ML SOLN
100.0000 mL | Freq: Once | INTRAVENOUS | Status: AC | PRN
Start: 2015-07-28 — End: 2015-07-28
  Administered 2015-07-28: 100 mL via INTRAVENOUS

## 2015-07-28 MED ORDER — ASPIRIN 81 MG PO CHEW
324.0000 mg | CHEWABLE_TABLET | Freq: Once | ORAL | Status: AC
  Administered 2015-07-28: 324 mg via ORAL
  Filled 2015-07-28: qty 4

## 2015-07-28 MED ORDER — SUCRALFATE 1 GM/10ML PO SUSP: 1.0000 g | Freq: Three times a day (TID) | ORAL | Status: DC

## 2015-07-28 MED ORDER — GI COCKTAIL ~~LOC~~
30.0000 mL | Freq: Once | ORAL | Status: AC
  Administered 2015-07-28: 30 mL via ORAL
  Filled 2015-07-28: qty 30

## 2015-07-28 NOTE — Discharge Instructions (Signed)
As discussed, it is important that you follow up with your physicians for continued management of your condition. ° °If you develop any new, or concerning changes in your condition, please return to the emergency department immediately. ° ° ° °

## 2015-07-28 NOTE — ED Notes (Signed)
Pt reports fever, nausea, vomiting, diarrhea, and chest pain since yesterday.  Reports chest pain started after vomiting yesterday.  Reports movement doesn't make the pain any better or worse.

## 2015-07-28 NOTE — ED Provider Notes (Signed)
CSN: [id number]     Arrival date & time 07/28/15  0759 History   First MD Initiated Contact with Patient 07/28/15 0804     Chief Complaint  Patient presents with  . Fever  . Chest Pain     (Consider location/radiation/quality/duration/timing/severity/associated sxs/prior Treatment) HPI Patient is also concern of chest pain, fever. Patient notes that about 2 weeks ago, with ongoing cough, she was diagnosed with bronchitis. Subsequent, she completed a course of azithromycin. She was generally improving, until yesterday, when she developed pain from the sternum to the epigastrium. Since onset pain has been persistent, sharp, moderate. No change with positioning, inspiration. There has been nausea, vomiting, diarrhea since onset. Fever, 101.  Minimal relief with aspirin, Zantac. Patient recently started PPI as well. Patient is a smoker. She denies history of coronary disease.   Smoking cessation provided, particularly in light of this patient's evaluation in the ED.  Past Medical History  Diagnosis Date  . GERD (gastroesophageal reflux disease)   . Constipation   . PONV (postoperative nausea and vomiting)   . Ulcer of esophagus    Past Surgical History  Procedure Laterality Date  . Cervical conization w/bx  1983  . Colonoscopy  06/27/2011    Procedure: COLONOSCOPY;  Surgeon: Rogene Houston, MD;  Location: [facility];  Service: Endoscopy;  Laterality: N/A;  12:00   Family History  Problem Relation Age of Onset  . Colon cancer Maternal Uncle   . CVA Mother   . Esophageal varices Sister   . Hiatal hernia Sister    Social History  Substance Use Topics  . Smoking status: Current Every Day Smoker -- 1.00 packs/day for 30 years    Types: Cigarettes  . Smokeless tobacco: Never Used     Comment: 1 pack a day  . Alcohol Use: No   OB History    No data available     Review of Systems  Constitutional:       Per HPI, otherwise negative  HENT:       Per HPI,  otherwise negative  Respiratory:       Per HPI, otherwise negative  Cardiovascular:       Per HPI, otherwise negative  Gastrointestinal: Positive for nausea, vomiting and diarrhea. Negative for abdominal pain.  Endocrine:       Negative aside from HPI  Genitourinary:       Neg aside from HPI   Musculoskeletal:       Per HPI, otherwise negative  Skin: Negative.   Neurological: Negative for syncope.      Allergies  Review of patient's allergies indicates no known allergies.  Home Medications   Prior to Admission medications   Medication Sig Start Date End Date Taking? Authorizing Provider  acetaminophen (TYLENOL) 325 MG tablet Take 650 mg by mouth every 6 (six) hours as needed for fever.   Yes Historical Provider, MD  Ascorbic Acid (VITAMIN C PO) Take 1 tablet by mouth daily.   Yes Historical Provider, MD  calcium carbonate (TUMS - DOSED IN MG ELEMENTAL CALCIUM) 500 MG chewable tablet Chew 2 tablets by mouth daily as needed for indigestion or heartburn.   Yes Historical Provider, MD  Calcium Citrate-Vitamin D (CALCIUM CITRATE + D PO) Take 1 tablet by mouth daily.    Yes Historical Provider, MD  cetirizine (ZYRTEC) 10 MG tablet Take 10 mg by mouth daily.   Yes Historical Provider, MD  Cholecalciferol (VITAMIN D) 2000 UNITS tablet Take 2,000 Units by mouth  daily.   Yes Historical Provider, MD  docusate sodium (COLACE) 100 MG capsule Take 100 mg by mouth daily.   Yes Historical Provider, MD  magnesium hydroxide (MILK OF MAGNESIA) 400 MG/5ML suspension Take 15 mLs by mouth daily as needed. For constipation    Yes Historical Provider, MD  omeprazole (PRILOSEC) 20 MG capsule Take 1 capsule by mouth 2 (two) times daily. 07/25/15  Yes Historical Provider, MD   BP 99/61 mmHg  Pulse 62  Temp(Src) 99.1 F (37.3 C) (Oral)  Resp 15  Ht 5\' 3"  (1.6 m)  Wt 120 lb (54.432 kg)  BMI 21.26 kg/m2  SpO2 96% Physical Exam  Constitutional: She is oriented to person, place, and time. She appears  well-developed and well-nourished. No distress.  HENT:  Head: Normocephalic and atraumatic.  Eyes: Conjunctivae and EOM are normal.  Cardiovascular: Normal rate and regular rhythm.   Pulmonary/Chest: Effort normal and breath sounds normal. No stridor. No respiratory distress.  Abdominal: She exhibits no distension. There is no tenderness.  Musculoskeletal: She exhibits no edema.  Neurological: She is alert and oriented to person, place, and time. No cranial nerve deficit.  Skin: Skin is warm and dry.  Psychiatric: She has a normal mood and affect.  Nursing note and vitals reviewed.   ED Course  Procedures (including critical care time) Labs Review Labs Reviewed  COMPREHENSIVE METABOLIC PANEL - Abnormal; Notable for the following:    Glucose, Bld 115 (*)    ALT 10 (*)    All other components within normal limits  D-DIMER, QUANTITATIVE (NOT AT [facility]) - Abnormal; Notable for the following:    D-Dimer, Quant 0.87 (*)    All other components within normal limits  CBC  MAGNESIUM  PROTIME-INR  TROPONIN I    Imaging Review Dg Chest 2 View  07/28/2015  CLINICAL DATA:  Fever, nausea, vomiting, diarrhea and chest pain beginning 07/27/2015. Initial encounter. EXAM: CHEST  2 VIEW COMPARISON:  PA and lateral chest 07/06/2015. Single view of the chest 07/29/2005. FINDINGS: The lungs are clear. Heart size is normal. No pneumothorax or pleural effusion. No focal bony abnormality. IMPRESSION: Negative chest. Electronically Signed   By: Inge Rise M.D.   On: 07/28/2015 09:04   Ct Angio Chest Pe W/cm &/or Wo Cm  07/28/2015  CLINICAL DATA:  Fever, nausea, vomiting and diarrhea as well as chest pain since yesterday. EXAM: CT ANGIOGRAPHY CHEST WITH CONTRAST TECHNIQUE: Multidetector CT imaging of the chest was performed using the standard protocol during bolus administration of intravenous contrast. Multiplanar CT image reconstructions and MIPs were obtained to evaluate the vascular anatomy.  CONTRAST:  112mL OMNIPAQUE IOHEXOL 350 MG/ML SOLN COMPARISON:  Current chest radiographs FINDINGS: Angiographic study: No evidence of a pulmonary embolus. Great vessels are normal in caliber. No aortic dissection or plaque. Thoracic inlet: No mass or adenopathy. Visualized thyroid is unremarkable. Mediastinum and hila: Normal heart. No mediastinal or hilar masses or pathologically enlarged lymph nodes. Lungs and pleura: Dependent subsegmental atelectasis in the lower lobes. No convincing pneumonia. No lung mass or suspicious nodule. No evidence of pulmonary edema. No pleural effusion or pneumothorax. Limited upper abdomen:  Unremarkable. Musculoskeletal: Mild compression fracture of T4, which appears old. Minor degenerate spurring along the mid and lower thoracic spine. No other bony abnormality. Review of the MIP images confirms the above findings. IMPRESSION: 1. No evidence of a pulmonary embolus. 2. No acute findings. 3. Mild dependent subsegmental atelectasis in the lower lobes. No evidence of pneumonia or pulmonary edema.  Electronically Signed   By: Lajean Manes M.D.   On: 07/28/2015 10:08   I have personally reviewed and evaluated these images and lab results as part of my medical decision-making.   EKG Interpretation   Date/Time:  Friday July 28 2015 08:09:00 EST Ventricular Rate:  74 PR Interval:  153 QRS Duration: 79 QT Interval:  370 QTC Calculation: 410 R Axis:   -54 Text Interpretation:  Sinus rhythm Left anterior fascicular block Anterior  infarct, old Nonspecific T abnormalities, lateral leads Sinus rhythm q  waves anteriorly T wave abnormality Abnormal ekg Confirmed by Carmin Muskrat  MD ([id number]) on 07/28/2015 8:50:07 AM     On repeat exam the patient is smiling, in no distress. I discussed all findings with her and her husband. He notes that the patient has previously been told she had ulcerations on her esophagus, via prior EGD. No ongoing complaints.  MDM  Patient  presents with greater than one day of chest pain, as well as fever, nausea. Patient has no abdominal pain. No evidence for ongoing coronary ischemia, nor pulmonary embolism. No evidence for pneumonia, peritonitis. No evidence for vascular compromise. Patient improved here substantially, was smiling on repeat assessment. With no ongoing complaints, the patient was stable for d/c, will follow up with GI, primary care.   Carmin Muskrat, MD 07/28/15 [phone]

## 2015-08-07 ENCOUNTER — Other Ambulatory Visit (INDEPENDENT_AMBULATORY_CARE_PROVIDER_SITE_OTHER): Payer: Self-pay | Admitting: Internal Medicine

## 2015-08-07 ENCOUNTER — Encounter (INDEPENDENT_AMBULATORY_CARE_PROVIDER_SITE_OTHER): Payer: Self-pay | Admitting: Internal Medicine

## 2015-08-07 ENCOUNTER — Encounter (INDEPENDENT_AMBULATORY_CARE_PROVIDER_SITE_OTHER): Payer: Self-pay | Admitting: *Deleted

## 2015-08-07 ENCOUNTER — Ambulatory Visit (INDEPENDENT_AMBULATORY_CARE_PROVIDER_SITE_OTHER): Payer: 59 | Admitting: Internal Medicine

## 2015-08-07 VITALS — BP 122/72 | HR 76 | Temp 97.8°F | Ht 63.0 in | Wt 120.0 lb

## 2015-08-07 DIAGNOSIS — K2100 Gastro-esophageal reflux disease with esophagitis, without bleeding: Secondary | ICD-10-CM

## 2015-08-07 MED ORDER — SUCRALFATE 1 GM/10ML PO SUSP: 1.0000 g | Freq: Three times a day (TID) | ORAL | Status: DC

## 2015-08-07 NOTE — Progress Notes (Signed)
Subjective:    Patient ID: Madison Walsh, female    DOB: [birth year], 54 y.o.   MRN: [id number]  HPI Here today for f/u after recent visit to [facility]  (11/181/2016) for chest pain, fever, nausea, vomiting and diarrhea. Two weeks prior to going to ED, she was diagnosed with bronchitis and completed a course of Azithromycin. One day prior to going to ED she began to have chest pain  and fever. Fever 101 the day before she went to the ED.   The nausea and vomiting resolved before going to the ED. The vomiting and nausea lasted about an hour.  She has history of erosive reflux esophagitis, constipation and history of colonic adenomas. Cardiac work up was normal.  Her chest pain resolved after taking a GI cocktail.  She says her chest pain is minor now.  She has always taken her Omeprazole 20mg  BID. She is currently taking BID. She is also taking Carafate four times a day and she says it is helping. No weight loss. No abdominal pain.  BM x 1 a day. No melena or BRRB.  No dysphagia.      07/28/2015 D. Dimer 0.87. Hepatic Function Panel     Component Value Date/Time   PROT 6.8 07/28/2015 0845   ALBUMIN 3.8 07/28/2015 0845   AST 19 07/28/2015 0845   ALT 10* 07/28/2015 0845   ALKPHOS 59 07/28/2015 0845   BILITOT 0.5 07/28/2015 0845     CBC    Component Value Date/Time   WBC 8.8 07/28/2015 0845   RBC 4.73 07/28/2015 0845   HGB 15.0 07/28/2015 0845   HCT 43.6 07/28/2015 0845   PLT 210 07/28/2015 0845   MCV 92.2 07/28/2015 0845   MCH 31.7 07/28/2015 0845   MCHC 34.4 07/28/2015 0845   RDW 14.3 07/28/2015 0845      11/181/2016 CT  Angio chest with/without CM:     CLINICAL DATA: Fever, nausea, vomiting and diarrhea as well as chest pain since yesterday.  IMPRESSION: 1. No evidence of a pulmonary embolus. 2. No acute findings. 3. Mild dependent subsegmental atelectasis in the lower lobes. No evidence of pneumonia or pulmonary edema.            01/11/2011  Esophagogastroduodenoscopy.  INDICATIONS: Madison Walsh is 54 year old Caucasian female with recurrent postprandial bloating who had abdominal CT which revealed very dilated stomach. She is therefore referred for further evaluation. She denies abdominal pain. She has had problems with constipation, but she is  FINAL DIAGNOSES: 1. Erosive reflux esophagitis with 2-cm size sliding hiatal hernia. 2. Antral gastritis with a scar and mucosal edema and scarring in the  prepyloric region with appearance of polyp. This area was  biopsied. 3. Endoscopically, appears to be benign process.  Biopsy negative for H. pylori  06/27/2011 Colonoscopy with snare polypectomy  Indications: Patient is 54 year old Caucasian female who is here for average risk screening colonoscopy. She had a paternal uncle died of colon carcinoma at age 46 but she does not have a first degree relative with history of CRC. Prep excellent. Redundant and tortuous colon. 3 hyperplastic appearing polyps at splenic flexure which were coagulated using snare tip. 10 mm polyp at distal sigmoid colon. Was snared and retrieved for histologic examination Ano-rectal junction was unremarkable. Biopsy results reviewed with patient's husband. 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 Next colonoscopy would be in 5 years. Copy to PCP. Review of Systems Past Medical History  Diagnosis Date  . GERD (gastroesophageal reflux disease)   . Constipation   .  PONV (postoperative nausea and vomiting)   . Ulcer of esophagus     Past Surgical History  Procedure Laterality Date  . Cervical conization w/bx  1983  . Colonoscopy  06/27/2011    Procedure: COLONOSCOPY;  Surgeon: Rogene Houston, MD;  Location: [facility];  Service: Endoscopy;  Laterality: N/A;  12:00    No Known Allergies  Current Outpatient Prescriptions on File Prior to Visit  Medication Sig Dispense Refill  . acetaminophen (TYLENOL) 325 MG tablet Take 650 mg by mouth every 6  (six) hours as needed for fever.    . Ascorbic Acid (VITAMIN C PO) Take 1 tablet by mouth daily.    . calcium carbonate (TUMS - DOSED IN MG ELEMENTAL CALCIUM) 500 MG chewable tablet Chew 2 tablets by mouth daily as needed for indigestion or heartburn.    . Calcium Citrate-Vitamin D (CALCIUM CITRATE + D PO) Take 1 tablet by mouth daily.     . cetirizine (ZYRTEC) 10 MG tablet Take 10 mg by mouth daily.    . Cholecalciferol (VITAMIN D) 2000 UNITS tablet Take 1,000 Units by mouth daily.     Marland Kitchen docusate sodium (COLACE) 100 MG capsule Take 100 mg by mouth daily.    . magnesium hydroxide (MILK OF MAGNESIA) 400 MG/5ML suspension Take 15 mLs by mouth daily as needed. For constipation     . omeprazole (PRILOSEC) 20 MG capsule Take 1 capsule by mouth 2 (two) times daily.     No current facility-administered medications on file prior to visit.        Objective:   Physical ExamBlood pressure 122/72, pulse 76, temperature 97.8 F (36.6 C), height 5\' 3"  (1.6 m), weight 120 lb (54.432 kg).  Alert and oriented. Skin warm and dry. Oral mucosa is moist.   . Sclera anicteric, conjunctivae is pink. Thyroid not enlarged. No cervical lymphadenopathy. Lungs clear. Heart regular rate and rhythm.  Abdomen is soft. Bowel sounds are positive. No hepatomegaly. No abdominal masses felt. No tenderness.  No edema to lower extremities.         Assessment & Plan:  Chest pain. Probably erosive esophagitis. Better since starting Prilosec and Carafate. Needs EGD.

## 2015-08-07 NOTE — Patient Instructions (Signed)
Continue the Prilosec BID. Refill on Carafate

## 2015-08-10 ENCOUNTER — Encounter (HOSPITAL_COMMUNITY)
Admission: RE | Disposition: A | Discharge status: Home / Self Care | Payer: Self-pay | Source: Ambulatory Visit | Attending: Internal Medicine

## 2015-08-10 ENCOUNTER — Encounter (HOSPITAL_COMMUNITY): Payer: Self-pay | Admitting: *Deleted

## 2015-08-10 ENCOUNTER — Ambulatory Visit (HOSPITAL_COMMUNITY)
Admission: RE | Admit: 2015-08-10 | Discharge: 2015-08-10 | Disposition: A | Discharge status: Home / Self Care | Payer: 59 | Source: Ambulatory Visit | Attending: Internal Medicine | Admitting: Internal Medicine

## 2015-08-10 DIAGNOSIS — K449 Diaphragmatic hernia without obstruction or gangrene: Secondary | ICD-10-CM | POA: Insufficient documentation

## 2015-08-10 DIAGNOSIS — Z79899 Other long term (current) drug therapy: Secondary | ICD-10-CM | POA: Insufficient documentation

## 2015-08-10 DIAGNOSIS — R0789 Other chest pain: Secondary | ICD-10-CM | POA: Diagnosis present

## 2015-08-10 DIAGNOSIS — K2100 Gastro-esophageal reflux disease with esophagitis, without bleeding: Secondary | ICD-10-CM

## 2015-08-10 DIAGNOSIS — R079 Chest pain, unspecified: Secondary | ICD-10-CM | POA: Diagnosis not present

## 2015-08-10 DIAGNOSIS — K5900 Constipation, unspecified: Secondary | ICD-10-CM | POA: Diagnosis not present

## 2015-08-10 DIAGNOSIS — K3189 Other diseases of stomach and duodenum: Secondary | ICD-10-CM | POA: Diagnosis not present

## 2015-08-10 DIAGNOSIS — K219 Gastro-esophageal reflux disease without esophagitis: Secondary | ICD-10-CM | POA: Diagnosis not present

## 2015-08-10 DIAGNOSIS — F17210 Nicotine dependence, cigarettes, uncomplicated: Secondary | ICD-10-CM | POA: Insufficient documentation

## 2015-08-10 HISTORY — PX: ESOPHAGOGASTRODUODENOSCOPY: SHX5428

## 2015-08-10 SURGERY — EGD (ESOPHAGOGASTRODUODENOSCOPY)
Anesthesia: Moderate Sedation

## 2015-08-10 MED ORDER — MIDAZOLAM HCL 5 MG/5ML IJ SOLN
INTRAMUSCULAR | Status: AC
  Filled 2015-08-10: qty 10

## 2015-08-10 MED ORDER — SODIUM CHLORIDE 0.9 % IV SOLN
INTRAVENOUS | Status: DC
  Administered 2015-08-10: 1000 mL via INTRAVENOUS

## 2015-08-10 MED ORDER — MEPERIDINE HCL 50 MG/ML IJ SOLN
INTRAMUSCULAR | Status: DC | PRN
  Administered 2015-08-10 (×2): 25 mg via INTRAVENOUS

## 2015-08-10 MED ORDER — MIDAZOLAM HCL 5 MG/5ML IJ SOLN
INTRAMUSCULAR | Status: DC | PRN
  Administered 2015-08-10 (×2): 2 mg via INTRAVENOUS
  Administered 2015-08-10: 1 mg via INTRAVENOUS
  Administered 2015-08-10: 2 mg via INTRAVENOUS

## 2015-08-10 MED ORDER — MEPERIDINE HCL 50 MG/ML IJ SOLN
INTRAMUSCULAR | Status: AC
  Filled 2015-08-10: qty 1

## 2015-08-10 MED ORDER — BUTAMBEN-TETRACAINE-BENZOCAINE 2-2-14 % EX AERO
INHALATION_SPRAY | CUTANEOUS | Status: DC | PRN
  Administered 2015-08-10: 2 via TOPICAL

## 2015-08-10 NOTE — H&P (Signed)
Madison Walsh is an 54 y.o. female.   Chief Complaint: Patient is here for EGD. HPI: Patient is 54 year old Caucasian female was history of erosive reflux esophagitis and hiatal hernia was last EGD was in May 2012 presented with few episodes of chest pain and negative cardiac workup. Last month she was treated for bronchitis. But 2 weeks ago she developed nausea vomiting. She believes she had gastroenteritis. She is on omeprazole 20 mg twice a day. Sucralfate was added. She is feeling better. She has heartburn every now and then no more than once a week. She denies dysphagia melena or rectal bleeding. She does not take OTC NSAIDs. She does not drink alcohol but smokes a pack of cigarettes per day.  Past Medical History  Diagnosis Date  . GERD (gastroesophageal reflux disease)   . Constipation   . PONV (postoperative nausea and vomiting)   . Ulcer of esophagus     Past Surgical History  Procedure Laterality Date  . Cervical conization w/bx  1983  . Colonoscopy  06/27/2011    Procedure: COLONOSCOPY;  Surgeon: Rogene Houston, MD;  Location: [facility];  Service: Endoscopy;  Laterality: N/A;  12:00    Family History  Problem Relation Age of Onset  . Colon cancer Maternal Uncle   . CVA Mother   . Esophageal varices Sister   . Hiatal hernia Sister    Social History:  reports that she has been smoking Cigarettes.  She has a 30 pack-year smoking history. She has never used smokeless tobacco. She reports that she does not drink alcohol or use illicit drugs.  Allergies: No Known Allergies  Medications Prior to Admission  Medication Sig Dispense Refill  . acetaminophen (TYLENOL) 325 MG tablet Take 650 mg by mouth every 6 (six) hours as needed for fever.    . Ascorbic Acid (VITAMIN C PO) Take 1 tablet by mouth daily.    . calcium carbonate (TUMS - DOSED IN MG ELEMENTAL CALCIUM) 500 MG chewable tablet Chew 2 tablets by mouth daily as needed for indigestion or heartburn.    . Calcium  Citrate-Vitamin D (CALCIUM CITRATE + D PO) Take 1 tablet by mouth daily.     . cetirizine (ZYRTEC) 10 MG tablet Take 10 mg by mouth daily.    . Cholecalciferol (VITAMIN D) 2000 UNITS tablet Take 1,000 Units by mouth daily.     Marland Kitchen docusate sodium (COLACE) 100 MG capsule Take 100 mg by mouth daily.    Marland Kitchen omeprazole (PRILOSEC) 20 MG capsule Take 1 capsule by mouth 2 (two) times daily.    . sucralfate (CARAFATE) 1 GM/10ML suspension Take 10 mLs (1 g total) by mouth 4 (four) times daily -  with meals and at bedtime. 420 mL 3  . magnesium hydroxide (MILK OF MAGNESIA) 400 MG/5ML suspension Take 15 mLs by mouth daily as needed. For constipation       No results found for this or any previous visit (from the past 48 hour(s)). No results found.  ROS  Blood pressure 101/41, pulse 66, temperature 99.2 F (37.3 C), temperature source Oral, resp. rate 19, height 5\' 3"  (1.6 m), weight 120 lb (54.432 kg), SpO2 98 %. Physical Exam  Constitutional: She appears well-developed and well-nourished.  HENT:  Mouth/Throat: Oropharynx is clear and moist.  Eyes: Conjunctivae are normal. No scleral icterus.  Neck: No thyromegaly present.  Cardiovascular: Normal rate, regular rhythm and normal heart sounds.   No murmur heard. Respiratory: Effort normal and breath sounds normal.  GI: Soft. She exhibits no distension and no mass. There is no tenderness.  Musculoskeletal: She exhibits no edema.  Lymphadenopathy:    She has no cervical adenopathy.  Neurological: She is alert.  Skin: Skin is warm and dry.     Assessment/Plan Noncardiac chest pain. History of erosive reflux esophagitis. Diagnostic EGD.  Madison Walsh 08/10/2015, 12:40 PM

## 2015-08-10 NOTE — Discharge Instructions (Addendum)
Resume usual medications and diet. No driving for 24 hours. Physician will call with biopsy results.     Esophagogastroduodenoscopy, Care After Refer to this sheet in the next few weeks. These instructions provide you with information about caring for yourself after your procedure. Your health care provider may also give you more specific instructions. Your treatment has been planned according to current medical practices, but problems sometimes occur. Call your health care provider if you have any problems or questions after your procedure. WHAT TO EXPECT AFTER THE PROCEDURE After your procedure, it is typical to feel:  Soreness in your throat.  Pain with swallowing.  Sick to your stomach (nauseous).  Bloated.  Dizzy.  Fatigued. HOME CARE INSTRUCTIONS  Do not eat or drink anything until the numbing medicine (local anesthetic) has worn off and your gag reflex has returned. You will know that the local anesthetic has worn off when you can swallow comfortably.  Do not drive or operate machinery until directed by your health care provider.  Take medicines only as directed by your health care provider. SEEK MEDICAL CARE IF:   You cannot stop coughing.  You are not urinating at all or less than usual. SEEK IMMEDIATE MEDICAL CARE IF:  You have difficulty swallowing.  You cannot eat or drink.  You have worsening throat or chest pain.  You have dizziness or lightheadedness or you faint.  You have nausea or vomiting.  You have chills.  You have a fever.  You have severe abdominal pain.  You have black, tarry, or bloody stools.   This information is not intended to replace advice given to you by your health care provider. Make sure you discuss any questions you have with your health care provider.   Document Released: 08/12/2012 Document Revised: 09/16/2014 Document Reviewed: 08/12/2012 Elsevier Interactive Patient Education [employer].

## 2015-08-10 NOTE — Op Note (Signed)
EGD PROCEDURE REPORT  PATIENT:  Madison Walsh  MR#:  [id number] Birthdate:  12-31-1960, 54 y.o., female Endoscopist:  Dr. Rogene Houston, MD Referred By:  Dr. Purvis Kilts, MD  Procedure Date: 08/10/2015  Procedure:   EGD  Indications:  Patient is 54 year old Caucasian female who has history of erosive reflux esophagitis and maintenance dose PPI. She recently has been experiencing intermittent chest pain and cardiac workup is negative. She did have an episode of bronchitis last month requiring antibiotics and she also had self-limiting bout of gastroenteritis with nausea and vomiting recently. No history of melena or rectal bleeding or hematemesis. She also denies dysphagia.            Informed Consent:  The risks, benefits, alternatives & imponderables which include, but are not limited to, bleeding, infection, perforation, drug reaction and potential missed lesion have been reviewed.  The potential for biopsy, lesion removal, esophageal dilation, etc. have also been discussed.  Questions have been answered.  All parties agreeable.  Please see history & physical in medical record for more information.  Medications:  Demerol 50 mg IV Versed 7 mg IV Cetacaine spray topically for oropharyngeal anesthesia  Description of procedure:  The endoscope was introduced through the mouth and advanced to the second portion of the duodenum without difficulty or limitations. The mucosal surfaces were surveyed very carefully during advancement of the scope and upon withdrawal.  Findings:  Esophagus:  Mucosa of the esophagus was normal. GE junction was serrated or wavy with focal erythema. No erosions or ring noted. GEJ:  35 cm Hiatus:  37 cm Stomach:  Stomach was empty and distended very well with insufflation. Folds in the proximal stomach were normal. Examination of mucosa at gastric body, antrum, pyloric channel, and angularis fundus and cardia was normal. Duodenum:  Normal bulbar and post bulbar  mucosa.  Therapeutic/Diagnostic Maneuvers Performed:   Biopsies were taken from GE junction for routine histology.  Complications:  None  EBL: Minimal  Impression: Mild changes of reflux esophagitis limited to GE junction. Serrated or wavy GE junction. Biopsies taken to rule out short segment Barrett's. Small sliding hiatal hernia. No evidence of gastritis or peptic ulcer disease.  Recommendations:  Standard instructions given. Anti-reflux measures reinforced. Continue omeprazole and sucralfate at current dose for now. Patient counseled for the need to quit cigarette smoking. I will be contacting patient with biopsy results and further recommendations.  Eyonna Sandstrom U  08/10/2015  1:08 PM  CC: Dr. Hilma Favors, Betsy Coder, MD & Dr. Rayne Du ref. provider found

## 2015-08-14 ENCOUNTER — Encounter (HOSPITAL_COMMUNITY): Payer: Self-pay | Admitting: Internal Medicine

## 2015-08-23 ENCOUNTER — Encounter (INDEPENDENT_AMBULATORY_CARE_PROVIDER_SITE_OTHER): Payer: Self-pay | Admitting: *Deleted

## 2015-09-18 ENCOUNTER — Encounter (INDEPENDENT_AMBULATORY_CARE_PROVIDER_SITE_OTHER): Payer: Self-pay | Admitting: Internal Medicine

## 2015-09-19 ENCOUNTER — Telehealth (INDEPENDENT_AMBULATORY_CARE_PROVIDER_SITE_OTHER): Payer: Self-pay | Admitting: Internal Medicine

## 2015-09-19 MED ORDER — SUCRALFATE 1 G PO TABS: 1.0000 g | ORAL_TABLET | Freq: Three times a day (TID) | ORAL | Status: DC

## 2015-09-19 NOTE — Telephone Encounter (Signed)
Carafate po (pill reordered)

## 2015-10-24 ENCOUNTER — Ambulatory Visit (INDEPENDENT_AMBULATORY_CARE_PROVIDER_SITE_OTHER): Payer: 59 | Admitting: Internal Medicine

## 2015-10-24 ENCOUNTER — Encounter (INDEPENDENT_AMBULATORY_CARE_PROVIDER_SITE_OTHER): Payer: Self-pay | Admitting: Internal Medicine

## 2015-10-24 VITALS — BP 110/70 | HR 60 | Temp 98.0°F | Ht 63.0 in | Wt 126.2 lb

## 2015-10-24 DIAGNOSIS — K2100 Gastro-esophageal reflux disease with esophagitis, without bleeding: Secondary | ICD-10-CM

## 2015-10-24 NOTE — Progress Notes (Signed)
Subjective:    Patient ID: Madison Walsh, female    DOB: 1961-02-23, 55 y.o.   MRN: [id number]  HPI Here today for f/u after recently undergoing and EGD for hx of erosive reflux esophagitis and chest pain. Cardiac workup was negative. EGD revealed mild changes of reflux esophagitis limited to GE junction.  She tells me today she is doing better. She did have one episode (chest pain)  .  She said she took a Sudafed and her symptoms resolved. Her appetite is good.  She has gained 6 pounds since her last visit in November. BMs are normal. She takes a stool softener daily.   08/10/2015 EGD:  Indications: Patient is 55 year old Caucasian female who has history of erosive reflux esophagitis and maintenance dose PPI. She recently has been experiencing intermittent chest pain and cardiac workup is negative. She did have an episode of bronchitis last month requiring antibiotics and she also had self-limiting bout of gastroenteritis with nausea and vomiting recently. No history of melena or rectal bleeding or hematemesis. She also denies dysphagia.  Impression: Mild changes of reflux esophagitis limited to GE junction. Serrated or wavy GE junction. Biopsies taken to rule out short segment Barrett's. Small sliding hiatal hernia. No evidence of gastritis or peptic ulcer disease.  Biopsy from GE junction shows changes of reflux esophagitis and negative for Barrett's. Results reviewed with patient's husband. 01/11/2011 Esophagogastroduodenoscopy.  INDICATIONS: Madison Walsh is 55 year old Caucasian female with recurrent postprandial bloating who had abdominal CT which revealed very dilated stomach. She is therefore referred for further evaluation. She denies abdominal pain. She has had problems with constipation, but she is FINAL DIAGNOSES: 1. Erosive reflux esophagitis with 2-cm size sliding hiatal hernia. 2. Antral gastritis with a scar and mucosal edema and scarring in the  prepyloric  region with appearance of polyp. This area was  biopsied. 3. Endoscopically, appears to be benign process. Biopsy negative for H. pylori  06/27/2011 Colonoscopy with snare polypectomy  Indications: Patient is 55 year old Caucasian female who is here for average risk screening colonoscopy. She had a paternal uncle died of colon carcinoma at age 22 but she does not have a first degree relative with history of CRC. Prep excellent. Redundant and tortuous colon. 3 hyperplastic appearing polyps at splenic flexure which were coagulated using snare tip. 10 mm polyp at distal sigmoid colon. Was snared and retrieved for histologic examination Ano-rectal junction was unremarkable. Biopsy results reviewed with patient's husband. 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 Next colonoscopy would be in 5 years.     Review of Systems Past Medical History  Diagnosis Date  . GERD (gastroesophageal reflux disease)   . Constipation   . PONV (postoperative nausea and vomiting)   . Ulcer of esophagus     Past Surgical History  Procedure Laterality Date  . Cervical conization w/bx  1983  . Colonoscopy  06/27/2011    Procedure: COLONOSCOPY;  Surgeon: Rogene Houston, MD;  Location: [facility];  Service: Endoscopy;  Laterality: N/A;  12:00  . Esophagogastroduodenoscopy N/A 08/10/2015    Procedure: ESOPHAGOGASTRODUODENOSCOPY (EGD);  Surgeon: Rogene Houston, MD;  Location: [facility];  Service: Endoscopy;  Laterality: N/A;  1:00    No Known Allergies  Current Outpatient Prescriptions on File Prior to Visit  Medication Sig Dispense Refill  . acetaminophen (TYLENOL) 325 MG tablet Take 650 mg by mouth every 6 (six) hours as needed for fever.    . Ascorbic Acid (VITAMIN C PO) Take 1 tablet by mouth daily.    Marland Kitchen  calcium carbonate (TUMS - DOSED IN MG ELEMENTAL CALCIUM) 500 MG chewable tablet Chew 2  tablets by mouth daily as needed for indigestion or heartburn.    . Calcium Citrate-Vitamin D (CALCIUM CITRATE + D PO) Take 1 tablet by mouth daily.     . cetirizine (ZYRTEC) 10 MG tablet Take 10 mg by mouth daily.    . Cholecalciferol (VITAMIN D) 2000 UNITS tablet Take 1,000 Units by mouth daily.     Marland Kitchen docusate sodium (COLACE) 100 MG capsule Take 100 mg by mouth daily.    . magnesium hydroxide (MILK OF MAGNESIA) 400 MG/5ML suspension Take 15 mLs by mouth daily as needed. For constipation     . omeprazole (PRILOSEC) 20 MG capsule Take 1 capsule by mouth 2 (two) times daily.    . sucralfate (CARAFATE) 1 g tablet Take 1 tablet (1 g total) by mouth 4 (four) times daily -  with meals and at bedtime. (Patient taking differently: Take 1 g by mouth 2 (two) times daily. ) 120 tablet 4   No current facility-administered medications on file prior to visit.        Objective:   Physical ExamBlood pressure 110/70, pulse 60, temperature 98 F (36.7 C), height 5\' 3"  (1.6 m), weight 126 lb 3.2 oz (57.244 kg). Alert and oriented. Skin warm and dry. Oral mucosa is moist.   . Sclera anicteric, conjunctivae is pink. Thyroid not enlarged. No cervical lymphadenopathy. Lungs clear. Heart regular rate and rhythm.  Abdomen is soft. Bowel sounds are positive. No hepatomegaly. No abdominal masses felt. No tenderness.  No edema to lower extremities.          Assessment & Plan:  GERD. Controlled with Omeprazole at this time. She has slowed her smoking to 15 cigarettes a day.  OV in one year.  Recall in October for Colonoscopy

## 2015-10-24 NOTE — Patient Instructions (Addendum)
Continue the Omeprazole 20mg  twice a day. OV in one year. Recall in October for a screening colonoscopy

## 2016-06-20 ENCOUNTER — Encounter (INDEPENDENT_AMBULATORY_CARE_PROVIDER_SITE_OTHER): Payer: Self-pay | Admitting: *Deleted

## 2016-08-14 ENCOUNTER — Encounter (INDEPENDENT_AMBULATORY_CARE_PROVIDER_SITE_OTHER): Payer: Self-pay | Admitting: Internal Medicine

## 2016-10-23 ENCOUNTER — Encounter (INDEPENDENT_AMBULATORY_CARE_PROVIDER_SITE_OTHER): Payer: Self-pay | Admitting: Internal Medicine

## 2016-10-23 ENCOUNTER — Ambulatory Visit (INDEPENDENT_AMBULATORY_CARE_PROVIDER_SITE_OTHER): Payer: 59 | Admitting: Internal Medicine

## 2016-10-23 VITALS — BP 130/80 | HR 72 | Temp 98.2°F | Ht 63.0 in | Wt 128.7 lb

## 2016-10-23 DIAGNOSIS — K2100 Gastro-esophageal reflux disease with esophagitis, without bleeding: Secondary | ICD-10-CM

## 2016-10-23 NOTE — Patient Instructions (Addendum)
OV in 1 year. Continue Omeprazole.

## 2016-10-23 NOTE — Progress Notes (Signed)
Subjective:    Patient ID: Madison Walsh, female    DOB: [birth year], 56 y.o.   MRN: [id number]  HPI Here today for f/u. Hx of erosive reflux esophagitis.   Last seen in February of 2017. Underwent an EGD I 2016 which revealed mild changes of reflux esohagitis.  She tells me she is doing good. Acid reflux controlled with Omeprazole. Occasionally has some epigastric pain. Usually has a BM dialy. No melena or BRRB. She is exercising some when weather permits.  08/10/2015 EGD:  Indications: Patient is 56 year old Caucasian female who has history of erosive reflux esophagitis and maintenance dose PPI. She recently has been experiencing intermittent chest pain and cardiac workup is negative. She did have an episode of bronchitis last month requiring antibiotics and she also had self-limiting bout of gastroenteritis with nausea and vomiting recently. No history of melena or rectal bleeding or hematemesis. She also denies dysphagia.  Impression: Mild changes of reflux esophagitis limited to GE junction. Serrated or wavy GE junction. Biopsies taken to rule out short segment Barrett's. Small sliding hiatal hernia. No evidence of gastritis or peptic ulcer disease.  Biopsy from GE junction shows changes of reflux esophagitis and negative for Barrett's. Results reviewed with patient's husband. 01/11/2011 Esophagogastroduodenoscopy.  INDICATIONS: Madison Walsh is 56 year old Caucasian female with recurrent postprandial bloating who had abdominal CT which revealed very dilated stomach. She is therefore referred for further evaluation. She denies abdominal pain. She has had problems with constipation, but she is FINAL DIAGNOSES: 1. Erosive reflux esophagitis with 2-cm size sliding hiatal hernia. 2. Antral gastritis with a scar and mucosal edema and scarring in the  prepyloric region with appearance of polyp. This area was  biopsied. 3. Endoscopically, appears to be benign  process. Biopsy negative for H. pylori  06/27/2011 Colonoscopy with snare polypectomy  Indications: Patient is 56 year old Caucasian female who is here for average risk screening colonoscopy. She had a paternal uncle died of colon carcinoma at age 56 but she does not have a first degree relative with history of CRC. Prep excellent. Redundant and tortuous colon. 3 hyperplastic appearing polyps at splenic flexure which were coagulated using snare tip. 10 mm polyp at distal sigmoid colon. Was snared and retrieved for histologic examination Ano-rectal junction was unremarkable. Biopsy results reviewed with patient's husband. 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 Next colonoscopy would be in 5 years.  Review of Systems Past Medical History:  Diagnosis Date  . Constipation   . GERD (gastroesophageal reflux disease)   . PONV (postoperative nausea and vomiting)   . Ulcer of esophagus     Past Surgical History:  Procedure Laterality Date  . CERVICAL CONIZATION W/BX  1983  . COLONOSCOPY  06/27/2011   Procedure: COLONOSCOPY;  Surgeon: Rogene Houston, MD;  Location: [facility];  Service: Endoscopy;  Laterality: N/A;  12:00  . ESOPHAGOGASTRODUODENOSCOPY N/A 08/10/2015   Procedure: ESOPHAGOGASTRODUODENOSCOPY (EGD);  Surgeon: Rogene Houston, MD;  Location: [facility];  Service: Endoscopy;  Laterality: N/A;  1:00    No Known Allergies  Current Outpatient Prescriptions on File Prior to Visit  Medication Sig Dispense Refill  . acetaminophen (TYLENOL) 325 MG tablet Take 650 mg by mouth every 6 (six) hours as needed for fever.    . Ascorbic Acid (VITAMIN C PO) Take 1 tablet by mouth daily.    . calcium carbonate (TUMS - DOSED IN MG ELEMENTAL CALCIUM) 500 MG chewable tablet Chew 2 tablets by mouth daily as needed for indigestion or heartburn.    Marland Kitchen  Calcium Citrate-Vitamin D (CALCIUM CITRATE + D PO) Take 1 tablet by mouth daily.     . cetirizine (ZYRTEC) 10 MG tablet Take 10 mg by mouth daily.     . Cholecalciferol (VITAMIN D) 2000 UNITS tablet Take 1,000 Units by mouth daily.     Marland Kitchen docusate sodium (COLACE) 100 MG capsule Take 100 mg by mouth daily.    . magnesium hydroxide (MILK OF MAGNESIA) 400 MG/5ML suspension Take 15 mLs by mouth daily as needed. For constipation     . omeprazole (PRILOSEC) 20 MG capsule Take 1 capsule by mouth 2 (two) times daily.    . sucralfate (CARAFATE) 1 g tablet Take 1 tablet (1 g total) by mouth 4 (four) times daily -  with meals and at bedtime. (Patient taking differently: Take 1 g by mouth as needed. ) 120 tablet 4   No current facility-administered medications on file prior to visit.        Objective:   Physical Examv Blood pressure 130/80, pulse 72, temperature 98.2 F (36.8 C), height 5\' 3"  (1.6 m), weight 128 lb 11.2 oz (58.4 kg). Alert and oriented. Skin warm and dry. Oral mucosa is moist.   . Sclera anicteric, conjunctivae is pink. Thyroid not enlarged. No cervical lymphadenopathy. Lungs clear. Heart regular rate and rhythm.  Abdomen is soft. Bowel sounds are positive. No hepatomegaly. No abdominal masses felt. No tenderness.  No edema to lower extremities.         Assessment & Plan:  GERD with esophagitis. Continue the Omeprzole. OV in 1 year.

## 2016-12-02 IMAGING — CT CT ANGIO CHEST
1 of 6 series · 5 of 36 positions shown · IV contrast (Omnipaque 300)
Comparison: Current chest radiographs

CLINICAL DATA: Fever, nausea, vomiting and diarrhea as well as
chest pain since yesterday.

EXAM:
CT ANGIOGRAPHY CHEST WITH CONTRAST
TECHNIQUE: Multidetector CT imaging of the chest was performed using the
standard protocol during bolus administration of intravenous
contrast. Multiplanar CT image reconstructions and MIPs were
obtained to evaluate the vascular anatomy.
CONTRAST:  100mL OMNIPAQUE IOHEXOL 350 MG/ML SOLN

[Series 4: pe 3.0 b40f · axial · 0.59mm/px · z∈[-252,-87]mm · 5 of 83 slices shown]
[im 14/83  lung]
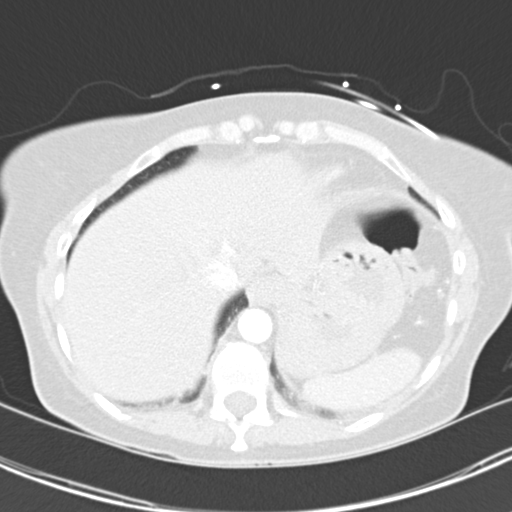
[im 28/83  mediastinal]
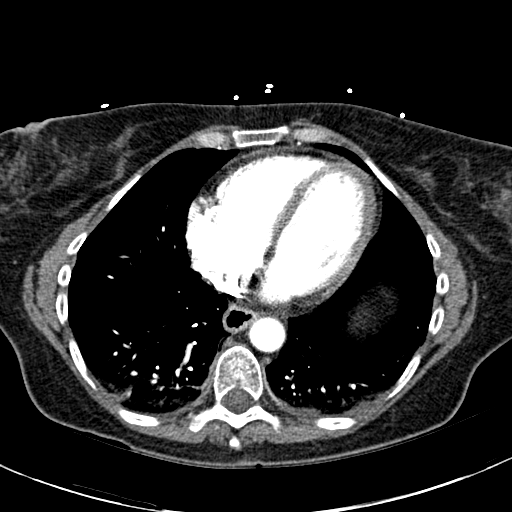
[im 42/83  lung]
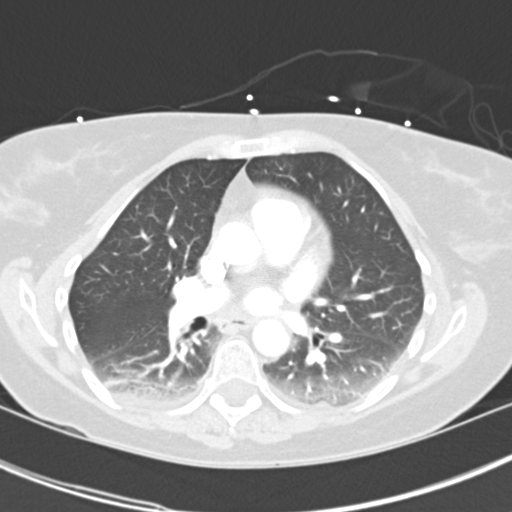
[im 55/83  mediastinal]
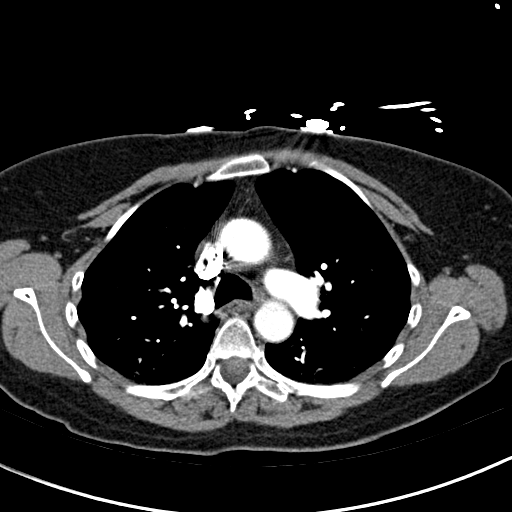
[im 69/83  lung]
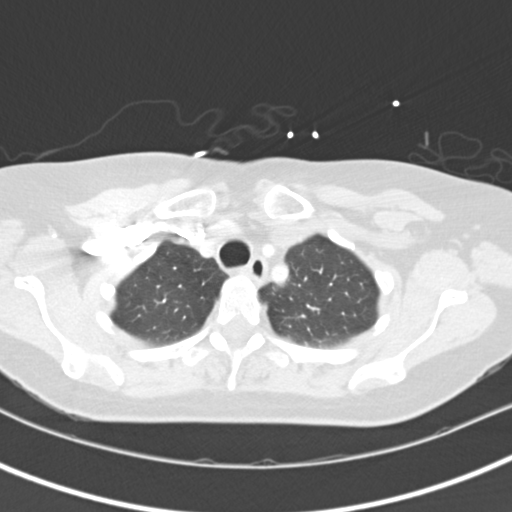

[5 of 36 positions shown; findings below may reference images not displayed]

FINDINGS: Angiographic study: No evidence of a pulmonary embolus. Great
vessels are normal in caliber. No aortic dissection or plaque.

Thoracic inlet: No mass or adenopathy. Visualized thyroid is
unremarkable.

Mediastinum and hila: Normal heart. No mediastinal or hilar masses
or pathologically enlarged lymph nodes.

Lungs and pleura: Dependent subsegmental atelectasis in the lower
lobes. No convincing pneumonia. No lung mass or suspicious nodule.
No evidence of pulmonary edema. No pleural effusion or pneumothorax.

Limited upper abdomen:  Unremarkable.

Musculoskeletal: Mild compression fracture of T4, which appears old.
Minor degenerate spurring along the mid and lower thoracic spine. No
other bony abnormality.

Review of the MIP images confirms the above findings.
IMPRESSION: 1. No evidence of a pulmonary embolus.
2. No acute findings.
3. Mild dependent subsegmental atelectasis in the lower lobes. No
evidence of pneumonia or pulmonary edema.

## 2017-06-03 ENCOUNTER — Encounter (HOSPITAL_COMMUNITY): Payer: Self-pay | Admitting: Neurology

## 2017-06-03 ENCOUNTER — Emergency Department (HOSPITAL_COMMUNITY)
Admission: EM | Admit: 2017-06-03 | Discharge: 2017-06-03 | Disposition: A | Discharge status: Home / Self Care | Payer: 59 | Attending: Emergency Medicine | Admitting: Emergency Medicine

## 2017-06-03 DIAGNOSIS — Z79899 Other long term (current) drug therapy: Secondary | ICD-10-CM | POA: Diagnosis not present

## 2017-06-03 DIAGNOSIS — F17210 Nicotine dependence, cigarettes, uncomplicated: Secondary | ICD-10-CM | POA: Diagnosis not present

## 2017-06-03 DIAGNOSIS — Z720 Tobacco use: Secondary | ICD-10-CM

## 2017-06-03 DIAGNOSIS — M5432 Sciatica, left side: Secondary | ICD-10-CM | POA: Diagnosis not present

## 2017-06-03 MED ORDER — OXYCODONE-ACETAMINOPHEN 5-325 MG PO TABS: ORAL_TABLET | ORAL | 0 refills | Status: DC

## 2017-06-03 MED ORDER — BACLOFEN 10 MG PO TABS: 10.0000 mg | ORAL_TABLET | Freq: Three times a day (TID) | ORAL | 0 refills | Status: DC

## 2017-06-03 MED ORDER — METHYLPREDNISOLONE 4 MG PO TBPK: ORAL_TABLET | ORAL | 0 refills | Status: DC

## 2017-06-03 MED ORDER — MELOXICAM 15 MG PO TABS: 15.0000 mg | ORAL_TABLET | Freq: Every day | ORAL | 0 refills | Status: DC

## 2017-06-03 MED ORDER — DEXAMETHASONE SODIUM PHOSPHATE 10 MG/ML IJ SOLN
10.0000 mg | Freq: Once | INTRAMUSCULAR | Status: AC
  Administered 2017-06-03: 10 mg via INTRAMUSCULAR
  Filled 2017-06-03: qty 1

## 2017-06-03 MED ORDER — KETOROLAC TROMETHAMINE 60 MG/2ML IM SOLN
30.0000 mg | Freq: Once | INTRAMUSCULAR | Status: AC
  Administered 2017-06-03: 30 mg via INTRAMUSCULAR
  Filled 2017-06-03: qty 2

## 2017-06-03 NOTE — ED Provider Notes (Signed)
[facility] DEPT Provider Note   CSN: [id number] Arrival date & time: 06/03/17  3710     History   Chief Complaint Chief Complaint  Patient presents with  . Back Pain    HPI Madison Walsh is a 56 y.o. female who presents emergency Department with chief complaint of left sciatica. The patient states that her symptoms began as some mild lower back pain about 2 weeks ago. She headed to the beach with her husband and during the course of her stay developed severe radiating pain down the left leg. She saw a chiropractor while she was awake who treated her with anti-inflammatories, a muscle rub, and chiropractic adjustments. Patient states that her pain improved temporarily however it has been progressively worsening since return from the beach. She complains of severe pain down the back of her left leg. Today she walked out to her mailbox to get her mail and by the time she got back she got pale and diaphoretic and nearly passed out due to her pain. Her husband brought her to the emergency department for evaluation. She denies chest pain shortness of breath. She is a daily smoker. She has never had symptoms like this before. Her pain is worse with any movement, sitting for long periods of time, standing or ambulating. She denies any burning, numbness or weakness. She describes the pain as cramping, aching and deep on the backside of the leg and down the rlateral side of her left leg Denies weakness, loss of bowel/bladder function or saddle anesthesia. Denies neck stiffness, headache, rash.  Denies fever or recent procedures to back.  HPI  Past Medical History:  Diagnosis Date  . Constipation   . GERD (gastroesophageal reflux disease)   . PONV (postoperative nausea and vomiting)   . Ulcer of esophagus     Patient Active Problem List   Diagnosis Date Noted  . Erosive esophagitis 03/08/2013  . Unspecified constipation 03/08/2013    Past Surgical History:  Procedure Laterality Date  .  CERVICAL CONIZATION W/BX  1983  . COLONOSCOPY  06/27/2011   Procedure: COLONOSCOPY;  Surgeon: Rogene Houston, MD;  Location: [facility];  Service: Endoscopy;  Laterality: N/A;  12:00  . ESOPHAGOGASTRODUODENOSCOPY N/A 08/10/2015   Procedure: ESOPHAGOGASTRODUODENOSCOPY (EGD);  Surgeon: Rogene Houston, MD;  Location: [facility];  Service: Endoscopy;  Laterality: N/A;  1:00    OB History    No data available       Home Medications    Prior to Admission medications   Medication Sig Start Date End Date Taking? Authorizing Provider  acetaminophen (TYLENOL) 325 MG tablet Take 650 mg by mouth every 6 (six) hours as needed for fever.    [provider]  Ascorbic Acid (VITAMIN C PO) Take 1 tablet by mouth daily.    [provider]  calcium carbonate (TUMS - DOSED IN MG ELEMENTAL CALCIUM) 500 MG chewable tablet Chew 2 tablets by mouth daily as needed for indigestion or heartburn.    [provider]  Calcium Citrate-Vitamin D (CALCIUM CITRATE + D PO) Take 1 tablet by mouth daily.     [provider]  cetirizine (ZYRTEC) 10 MG tablet Take 10 mg by mouth daily.    [provider]  Cholecalciferol (VITAMIN D) 2000 UNITS tablet Take 1,000 Units by mouth daily.     [provider]  docusate sodium (COLACE) 100 MG capsule Take 100 mg by mouth daily.    [provider]  magnesium hydroxide (  MILK OF MAGNESIA) 400 MG/5ML suspension Take 15 mLs by mouth daily as needed. For constipation     [provider]  omeprazole (PRILOSEC) 20 MG capsule Take 1 capsule by mouth 2 (two) times daily. 07/25/15   [provider]  sucralfate (CARAFATE) 1 g tablet Take 1 tablet (1 g total) by mouth 4 (four) times daily -  with meals and at bedtime. Patient taking differently: Take 1 g by mouth as needed.  09/19/15   Setzer, Rona Ravens, NP    Family History Family History  Problem Relation Age of Onset  . CVA Mother   . Esophageal varices  Sister   . Hiatal hernia Sister   . Colon cancer Maternal Uncle     Social History Social History  Substance Use Topics  . Smoking status: Current Every Day Smoker    Packs/day: 1.00    Years: 30.00    Types: Cigarettes  . Smokeless tobacco: Never Used     Comment: 1 pack a day  . Alcohol use No     Allergies   Patient has no known allergies.   Review of Systems Review of Systems Ten systems reviewed and are negative for acute change, except as noted in the HPI.    Physical Exam Updated Vital Signs BP (!) 106/45 (BP Location: Left Arm)   Pulse 67   Temp 98.4 F (36.9 C) (Oral)   Resp 16   Ht 5\' 3"  (1.6 m)   Wt 54.4 kg (120 lb)   SpO2 98%   BMI 21.26 kg/m   Physical Exam  Physical Exam  Nursing note and vitals reviewed. Constitutional: She is oriented to person, place, and time. She appears well-developed and well-nourished. No distress.  HENT:  Head: Normocephalic and atraumatic.  Eyes: Conjunctivae normal and EOM are normal. Pupils are equal, round, and reactive to light. No scleral icterus.  Neck: Normal range of motion.  Cardiovascular: Normal rate, regular rhythm and normal heart sounds.  Exam reveals no gallop and no friction rub.   No murmur heard. Pulmonary/Chest: Effort normal and breath sounds normal. No respiratory distress.  Abdominal: Soft. Bowel sounds are normal. She exhibits no distension and no mass. There is no tenderness. There is no guarding.  Neurological: She is alert and oriented to person, place, and time.  Musculoskeletal:Lumbosacral spine area reveals mild-moderate tenderness and mild spasm.  Painful and reduced LS ROM noted. Straight leg raise is positive at 30 degrees on left. DTR's, motor strength and sensation normal, including heel and toe gait.  Peripheral pulses are palpable. Hips and knees have limited range of motion  Due to pain in the lower back. NSkin: Skin is warm and dry. She is not diaphoretic.    ED Treatments / Results   Labs (all labs ordered are listed, but only abnormal results are displayed) Labs Reviewed - No data to display  EKG  EKG Interpretation None       Radiology No results found.  Procedures Procedures (including critical care time)  Medications Ordered in ED Medications - No data to display   Initial Impression / Assessment and Plan / ED Course  I have reviewed the triage vital signs and the nursing notes.  Pertinent labs & imaging results that were available during my care of the patient were reviewed by me and considered in my medical decision making (see chart for details).     Patient with back pain.  No neurological deficits and normal neuro exam.  Patient can  walk but states is painful.  No loss of bowel or bladder control.  No concern for cauda equina.  No fever, night sweats, weight loss, h/o cancer, IVDU.  RICE protocol and pain medicine indicated and discussed with patient.    Final Clinical Impressions(s) / ED Diagnoses   Final diagnoses:  Sciatica of left side  Tobacco abuse    New Prescriptions New Prescriptions   No medications on file     Margarita Mail, PA-C 06/03/17 [place], DO 06/03/17 1101

## 2017-06-03 NOTE — Discharge Instructions (Signed)
Contact a health care provider if: You have pain that wakes you up when you are sleeping. You have pain that gets worse when you lie down. Your pain is worse than you have experienced in the past. Your pain lasts longer than 4 weeks. You experience unexplained weight loss. Get help right away if: You lose control of your bowel or bladder (incontinence). You have: Weakness in your lower back, pelvis, buttocks, or legs that gets worse. Redness or swelling of your back.

## 2017-06-03 NOTE — ED Notes (Signed)
Ice pack given per patient request

## 2017-06-03 NOTE — ED Notes (Signed)
Pt c/p LBP for 2 weeks. Has seen a chiropractor recently. Called PCP in [place] but was told "she could come and wait to see the Dr.". Walked up to get her paper this am, felt like she was "going to pass out' so came to ED.

## 2017-06-03 NOTE — ED Triage Notes (Addendum)
Pt reports lower back pain x several weeks. Lower back, radiates down left leg. Has been taking ibuprofen and hydrocodone. This morning she was walking, and pain got so bad she felt she was going to pass out. She saw a chiropractor with no relief. She denies any urinary sx.

## 2017-09-18 ENCOUNTER — Encounter (INDEPENDENT_AMBULATORY_CARE_PROVIDER_SITE_OTHER): Payer: Self-pay | Admitting: Internal Medicine

## 2017-10-03 DIAGNOSIS — M40204 Unspecified kyphosis, thoracic region: Secondary | ICD-10-CM | POA: Insufficient documentation

## 2017-10-23 ENCOUNTER — Encounter (INDEPENDENT_AMBULATORY_CARE_PROVIDER_SITE_OTHER): Payer: Self-pay | Admitting: Internal Medicine

## 2017-10-23 ENCOUNTER — Ambulatory Visit (INDEPENDENT_AMBULATORY_CARE_PROVIDER_SITE_OTHER): Payer: Managed Care, Other (non HMO) | Admitting: Internal Medicine

## 2017-10-23 VITALS — BP 130/82 | HR 72 | Temp 98.2°F | Ht 63.0 in | Wt 129.0 lb

## 2017-10-23 DIAGNOSIS — K2210 Ulcer of esophagus without bleeding: Secondary | ICD-10-CM | POA: Diagnosis not present

## 2017-10-23 NOTE — Patient Instructions (Signed)
Continue the Omeprazole.  OV in 1 year.  

## 2017-10-23 NOTE — Progress Notes (Signed)
Subjective:     Patient ID: Madison Walsh, female   DOB: 1961-05-12, 57 y.o.   MRN: [id number]  HPI Here today for f/u. Last seen in February of 2018. Hx of erosive reflux esophagitis. GERD . Has not been taking her Omeprazole.   Underwent and EGD in 2016 which revealed Mild changes of reflux esophagitis limited to GE junction. Serrated or wavy GE junction. Biopsies taken to rule out short segment Barrett's. Small sliding hiatal hernia. No evidence of gastritis or peptic ulcer disease.  Biopsy from GE junction shows changes of reflux esophagitis and negative for Barrett's. Results reviewed with patient's husband. She tells she is doing well. She has started a Probiotic. Appetite is good. No weight loss.  Her BMs are normal. No melena or BRBB. She is tryng to exercise by walking 3  times a weeks.   Review of Systems Past Medical History:  Diagnosis Date  . Constipation   . GERD (gastroesophageal reflux disease)   . PONV (postoperative nausea and vomiting)   . Ulcer of esophagus     Past Surgical History:  Procedure Laterality Date  . CERVICAL CONIZATION W/BX  1983  . COLONOSCOPY  06/27/2011   Procedure: COLONOSCOPY;  Surgeon: Rogene Houston, MD;  [facility];  Service: Endoscopy;  Laterality: N/A;  12:00  . ESOPHAGOGASTRODUODENOSCOPY N/A 08/10/2015   Procedure: ESOPHAGOGASTRODUODENOSCOPY (EGD);  Surgeon: Rogene Houston, MD;  [facility]: [facility];  Service: Endoscopy;  Laterality: N/A;  1:00    No Known Allergies  Current Outpatient Medications on File Prior to Visit  Medication Sig Dispense Refill  . acetaminophen (TYLENOL) 325 MG tablet Take 650 mg by mouth every 6 (six) hours as needed for fever.    . Ascorbic Acid (VITAMIN C PO) Take 1 tablet by mouth daily.    . baclofen (LIORESAL) 10 MG tablet Take 1 tablet (10 mg total) by mouth 3 (three) times daily. 30 each 0  . calcium carbonate (TUMS - DOSED IN MG ELEMENTAL CALCIUM) 500 MG chewable tablet Chew 2 tablets by  mouth daily as needed for indigestion or heartburn.    . Calcium Citrate-Vitamin D (CALCIUM CITRATE + D PO) Take 1 tablet by mouth daily.     . cetirizine (ZYRTEC) 10 MG tablet Take 10 mg by mouth daily.    . Cholecalciferol (VITAMIN D) 2000 UNITS tablet Take 1,000 Units by mouth daily.     Marland Kitchen docusate sodium (COLACE) 100 MG capsule Take 100 mg by mouth daily.    . magnesium hydroxide (MILK OF MAGNESIA) 400 MG/5ML suspension Take 15 mLs by mouth daily as needed. For constipation     . meloxicam (MOBIC) 15 MG tablet Take 1 tablet (15 mg total) by mouth daily. Take 1 daily with food. 30 tablet 0  . methylPREDNISolone (MEDROL DOSEPAK) 4 MG TBPK tablet Use as directed 21 tablet 0  . Omega-3 Fatty Acids (FISH OIL) 1200 MG CAPS Take by mouth.    Marland Kitchen omeprazole (PRILOSEC) 20 MG capsule Take 1 capsule by mouth 2 (two) times daily.    Marland Kitchen oxyCODONE-acetaminophen (PERCOCET) 5-325 MG tablet Take 1/2 to 1 tablet every 4 hours, or 2 tablets every 6 hours as needed for severe pain. 10 tablet 0  . sucralfate (CARAFATE) 1 g tablet Take 1 tablet (1 g total) by mouth 4 (four) times daily -  with meals and at bedtime. (Patient taking differently: Take 1 g by mouth as needed. ) 120 tablet 4   No current  facility-administered medications on file prior to visit.         Objective:   Physical Exam Blood pressure 130/82, pulse 72, temperature 98.2 F (36.8 C), height 5\' 3"  (1.6 m), weight 129 lb (58.5 kg). Alert and oriented. Skin warm and dry. Oral mucosa is moist.   . Sclera anicteric, conjunctivae is pink. Thyroid not enlarged. No cervical lymphadenopathy. Lungs clear. Heart regular rate and rhythm.  Abdomen is soft. Bowel sounds are positive. No hepatomegaly. No abdominal masses felt. No tenderness.  No edema to lower extremities.        Assessment:    Reflux esophagitis. She will start the Omeprazole back. She says she is not having GERD at this time.     Plan:    OV in 1 year. She will start the Omeprazole  back.

## 2018-10-22 ENCOUNTER — Ambulatory Visit (INDEPENDENT_AMBULATORY_CARE_PROVIDER_SITE_OTHER): Payer: Managed Care, Other (non HMO) | Admitting: Internal Medicine

## 2018-10-22 ENCOUNTER — Encounter (INDEPENDENT_AMBULATORY_CARE_PROVIDER_SITE_OTHER): Payer: Self-pay | Admitting: Internal Medicine

## 2018-10-22 VITALS — BP 133/80 | HR 109 | Temp 98.5°F | Ht 63.0 in | Wt 128.3 lb

## 2018-10-22 DIAGNOSIS — K219 Gastro-esophageal reflux disease without esophagitis: Secondary | ICD-10-CM | POA: Diagnosis not present

## 2018-10-22 DIAGNOSIS — K2210 Ulcer of esophagus without bleeding: Secondary | ICD-10-CM | POA: Diagnosis not present

## 2018-10-22 NOTE — Patient Instructions (Signed)
Continue the Omeprazole.  OV in 1 year.  

## 2018-10-22 NOTE — Progress Notes (Signed)
Patient ID: Madison Walsh, female   DOB: [birth year], 58 y.o.   MRN: [id number]

## 2018-10-22 NOTE — Progress Notes (Signed)
Subjective:    Patient ID: Madison Walsh, female    DOB: 10-02-1960, 58 y.o.   MRN: [id number]  HPI Here today for f/u. Last seen in February of 2019 Hx of erosive esophagitis, GERD  .   Underwent and EGD in 2016 which revealed Mild changes of reflux esophagitis limited to GE junction. Serrated or wavy GE junction. Biopsies taken to rule out short segment Barrett's. Small sliding hiatal hernia. No evidence of gastritis or peptic ulcer disease Biopsy from GE junction shows changes of reflux esophagitis and negative for Barrett's. She states she is doing good. She is taking a probiotic which is  Helping. Has a BM daily. No melena or BRRB. Her appetite is good. No weight loss. She continues to exercise.x 2 at [employer].  She is working full time.  Review of Systems     Past Medical History:  Diagnosis Date  . Constipation   . GERD (gastroesophageal reflux disease)   . PONV (postoperative nausea and vomiting)   . Ulcer of esophagus     Past Surgical History:  Procedure Laterality Date  . CERVICAL CONIZATION W/BX  1983  . COLONOSCOPY  06/27/2011   Procedure: COLONOSCOPY;  Surgeon: Rogene Houston, MD;  [facility];  Service: Endoscopy;  Laterality: N/A;  12:00  . ESOPHAGOGASTRODUODENOSCOPY N/A 08/10/2015   Procedure: ESOPHAGOGASTRODUODENOSCOPY (EGD);  Surgeon: Rogene Houston, MD;  [facility];  Service: Endoscopy;  Laterality: N/A;  1:00    No Known Allergies  Current Outpatient Medications on File Prior to Visit  Medication Sig Dispense Refill  . acetaminophen (TYLENOL) 325 MG tablet Take 650 mg by mouth every 6 (six) hours as needed for fever.    . Ascorbic Acid (VITAMIN C PO) Take 1 tablet by mouth daily.    . baclofen (LIORESAL) 10 MG tablet Take 1 tablet (10 mg total) by mouth 3 (three) times daily. 30 each 0  . calcium carbonate (TUMS - DOSED IN MG ELEMENTAL CALCIUM) 500 MG chewable tablet Chew 2 tablets by mouth daily as needed for indigestion  or heartburn.    . Calcium Citrate-Vitamin D (CALCIUM CITRATE + D PO) Take 1 tablet by mouth daily.     . cetirizine (ZYRTEC) 10 MG tablet Take 10 mg by mouth daily.    . Cholecalciferol (VITAMIN D) 2000 UNITS tablet Take 1,000 Units by mouth daily.     Marland Kitchen docusate sodium (COLACE) 100 MG capsule Take 100 mg by mouth daily.    . magnesium hydroxide (MILK OF MAGNESIA) 400 MG/5ML suspension Take 15 mLs by mouth daily as needed. For constipation     . meloxicam (MOBIC) 15 MG tablet Take 1 tablet (15 mg total) by mouth daily. Take 1 daily with food. 30 tablet 0  . omeprazole (PRILOSEC) 20 MG capsule Take 1 capsule by mouth 2 (two) times daily.    . sucralfate (CARAFATE) 1 g tablet Take 1 tablet (1 g total) by mouth 4 (four) times daily -  with meals and at bedtime. (Patient taking differently: Take 1 g by mouth as needed. ) 120 tablet 4   No current facility-administered medications on file prior to visit.      Objective:   Physical Exam Blood pressure 133/80, pulse (!) 109, temperature 98.5 F (36.9 C), height 5\' 3"  (1.6 m), weight 128 lb 4.8 oz (58.2 kg). Alert and oriented. Skin warm and dry. Oral mucosa is moist.   . Sclera anicteric, conjunctivae is pink. Thyroid not  enlarged. No cervical lymphadenopathy. Lungs clear. Heart regular rate and rhythm.  Abdomen is soft. Bowel sounds are positive. No hepatomegaly. No abdominal masses felt. No tenderness.  No edema to lower extremities.           Assessment & Plan:  Reflux esophagitis, GERD. She will continue to Omeprazole. OV in 1 year.

## 2019-10-25 ENCOUNTER — Ambulatory Visit (INDEPENDENT_AMBULATORY_CARE_PROVIDER_SITE_OTHER): Payer: 59 | Admitting: Gastroenterology

## 2019-10-25 ENCOUNTER — Other Ambulatory Visit: Payer: Self-pay

## 2019-10-25 ENCOUNTER — Encounter (INDEPENDENT_AMBULATORY_CARE_PROVIDER_SITE_OTHER): Payer: Self-pay | Admitting: Gastroenterology

## 2019-10-25 ENCOUNTER — Ambulatory Visit (INDEPENDENT_AMBULATORY_CARE_PROVIDER_SITE_OTHER): Payer: Managed Care, Other (non HMO) | Admitting: Nurse Practitioner

## 2019-10-25 VITALS — BP 113/49 | HR 81 | Temp 97.2°F | Ht 63.0 in | Wt 123.6 lb

## 2019-10-25 DIAGNOSIS — K219 Gastro-esophageal reflux disease without esophagitis: Secondary | ICD-10-CM | POA: Diagnosis not present

## 2019-10-25 DIAGNOSIS — Z86010 Personal history of colonic polyps: Secondary | ICD-10-CM | POA: Diagnosis not present

## 2019-10-25 NOTE — Progress Notes (Signed)
Patient profile: Madison Walsh is a 59 y.o. female seen for evaluation of GERD, last seen February 2020.  History of Present Illness: Madison Walsh is seen today for yearly follow-up.  She reports since her last visit she has actually stopped the Prilosec 20mg  twice a day.  She was diagnosed with osteoporosis and concerned about the side effect.  She is now not on any medication for GERD just using Tums a few times a month.  If Tums does not help symptoms she has some Carafate she will take 4-5 times a year.  Usually she does well as long as she avoids greasy foods and dairy.  She denies any nausea, vomiting, dysphagia.  She has lost some weight intentionally as her husband has started a low-carb diet.  She usually takes a stool softener daily and has a bowel movement daily or every other day.  She has no lower abdominal pain, rectal bleed, melena.  No NSAIDS.  No longer on Mobic on medicine list.  Smokes 3/4 pack a day  Wt Readings from Last 3 Encounters:  10/25/19 123 lb 9.6 oz (56.1 kg)  10/22/18 128 lb 4.8 oz (58.2 kg)  10/23/17 129 lb (58.5 kg)     Last Colonoscopy: 2012-Impression:  Examination performed to cecum. Redundant  and tortuous colon. Three  small hyperplastic appearing polyps at splenic flexure were coagulated. 10 mm polyp snared from sigmoid colon. Path with tubular adenoma. 5 year repeat recommended     Last Endoscopy: 2016- Impression: Mild changes of reflux esophagitis limited to GE junction. Serrated or wavy GE junction. Biopsies taken to rule out short segment Barrett's. Small sliding hiatal hernia. No evidence of gastritis or peptic ulcer disease. Biopsy from GE junction shows changes of reflux esophagitis and negative for Barrett's.   Past Medical History:  Past Medical History:  Diagnosis Date  . Constipation   . GERD (gastroesophageal reflux disease)   . PONV (postoperative nausea and vomiting)   . Ulcer of esophagus     Problem List: Patient  Active Problem List   Diagnosis Date Noted  . Erosive esophagitis 03/08/2013  . Unspecified constipation 03/08/2013    Past Surgical History: Past Surgical History:  Procedure Laterality Date  . CERVICAL CONIZATION W/BX  1983  . COLONOSCOPY  06/27/2011   Procedure: COLONOSCOPY;  Surgeon: Rogene Houston, MD;  [facility];  Service: Endoscopy;  Laterality: N/A;  12:00  . ESOPHAGOGASTRODUODENOSCOPY N/A 08/10/2015   Procedure: ESOPHAGOGASTRODUODENOSCOPY (EGD);  Surgeon: Rogene Houston, MD;  [facility];  Service: Endoscopy;  Laterality: N/A;  1:00    Allergies: No Known Allergies    Home Medications:  Current Outpatient Medications:  .  acetaminophen (TYLENOL) 325 MG tablet, Take 650 mg by mouth every 6 (six) hours as needed for fever., Disp: , Rfl:  .  calcium carbonate (TUMS - DOSED IN MG ELEMENTAL CALCIUM) 500 MG chewable tablet, Chew 2 tablets by mouth daily as needed for indigestion or heartburn., Disp: , Rfl:  .  Calcium Citrate-Vitamin D (CALCIUM CITRATE + D PO), Take 1 tablet by mouth daily. , Disp: , Rfl:  .  cetirizine (ZYRTEC) 10 MG tablet, Take 10 mg by mouth daily., Disp: , Rfl:  .  Cholecalciferol (VITAMIN D) 2000 UNITS tablet, Take 1,000 Units by mouth daily. , Disp: , Rfl:  .  docusate sodium (COLACE) 100 MG capsule, Take 100 mg by mouth daily., Disp: , Rfl:  .  magnesium hydroxide (MILK OF MAGNESIA)  400 MG/5ML suspension, Take 15 mLs by mouth daily as needed. For constipation , Disp: , Rfl:  .  Multiple Vitamin (MULTI-VITAMIN PO), Take by mouth daily., Disp: , Rfl:  .  omeprazole (PRILOSEC) 20 MG capsule, Take 1 capsule by mouth 2 (two) times daily., Disp: , Rfl:  .  sucralfate (CARAFATE) 1 g tablet, Take 1 tablet (1 g total) by mouth 4 (four) times daily -  with meals and at bedtime. (Patient taking differently: Take 1 g by mouth as needed. ), Disp: 120 tablet, Rfl: 4 .  baclofen (LIORESAL) 10 MG tablet, Take 1 tablet (10 mg total) by mouth 3  (three) times daily. (Patient not taking: Reported on 10/25/2019), Disp: 30 each, Rfl: 0   Family History: family history includes CVA in her mother; Colon cancer in her maternal uncle; Esophageal varices in her sister; Hiatal hernia in her sister.  Sister w/ colon polyps   Social History:   reports that she has been smoking cigarettes. She has a 30.00 pack-year smoking history. She has never used smokeless tobacco. She reports that she does not drink alcohol or use drugs.  Smokes 3/4 PPD  No alcohol    Review of Systems: Constitutional: Denies weight loss/weight gain  Eyes: No changes in vision. ENT: No oral lesions, sore throat.  GI: see HPI.  Heme/Lymph: No easy bruising.  CV: No chest pain.  GU: No hematuria.  Integumentary: No rashes.  Neuro: No headaches.  Psych: No depression/anxiety.  Endocrine: No heat/cold intolerance.  Allergic/Immunologic: No urticaria.  Resp: No cough, SOB.  Musculoskeletal: No joint swelling.    Physical Examination: BP (!) 113/49 (BP Location: Right Arm, Patient Position: Sitting, Cuff Size: Normal)   Pulse 81   Temp (!) 97.2 F (36.2 C) (Temporal)   Ht 5\' 3"  (1.6 m)   Wt 123 lb 9.6 oz (56.1 kg)   BMI 21.89 kg/m  Gen: NAD, alert and oriented x 4 HEENT: PEERLA, EOMI, Neck: supple, no JVD Chest: CTA bilaterally, no wheezes, crackles, or other adventitious sounds CV: RRR, no m/g/c/r Abd: soft, NT, ND, +BS in all four quadrants; no HSM, guarding, ridigity, or rebound tenderness Ext: no edema, well perfused with 2+ pulses, Skin: no rash or lesions noted on observed skin Lymph: no noted LAD  Data Reviewed:     Assessment/Plan: [name] is a 59 y.o. female   1.  History of colon polyps-10 mm tubular adenoma on colonoscopy 2012, we discussed repeat colonoscopy at this time.  She denies any prior issues with sedation.  No lower GI symptoms. Will place order for colonoscopy when Covid restrictions lifted.  2.  GERD-she has pretty minimal  symptoms off of her PPI.  Given she is doing well agree with continuing off PPI.  We did discuss she can try Pepcid as needed.  She also finds Tums helpful.  We discussed diet modifications.  She has no upper GI alarm symptoms.  She will follow up with GI as needed after colonoscopy.  Patient denies CP, SOB, and use of blood thinners. I discussed the risks and benefits of procedure including bleeding, perforation, infection, missed lesions, medication reactions and possible hospitalization or surgery if complications. All questions answered.    Madison Walsh was seen today for follow-up.  Diagnoses and all orders for this visit:  Chronic GERD  Personal history of colonic polyps      I personally performed the service,non-incident to. (WP)  Laurine Blazer, [facility]

## 2019-10-25 NOTE — Patient Instructions (Addendum)
Can use pepcid as needed before meals to try to prevent symptoms if eating foods that tend to cause reflux   GERD instructions: -Please avoid lying flat within 2 to 3 hours of eating, this will make reflux symptoms worse. -Some patients find elevating the head of the bed beneficial. -Avoid spicy greasy foods as well as caffeine, coffee, sodas-these food/drinks can worsen heartburn and reflux. -Tobacco will worsen reflux, please try to decrease/eliminate tobacco intake if applicable. -Avoid NSAID products (ibuprofen, aspirin, Advil, Aleve, Goody's, BCs, Alka-Seltzer) - if needing these occasionally please try to take with meal or snack to decrease stomach irritation. -If taking medication for reflux such as prilosec, nexium, aciphex, dexilant, prevacid - take 20-30 minutes before a meal for maximum effectiveness.   We are arranging a colonoscopy when covid restrictions lifted

## 2019-10-26 ENCOUNTER — Other Ambulatory Visit (INDEPENDENT_AMBULATORY_CARE_PROVIDER_SITE_OTHER): Payer: Self-pay | Admitting: *Deleted

## 2019-10-26 DIAGNOSIS — Z86010 Personal history of colonic polyps: Secondary | ICD-10-CM

## 2019-10-28 NOTE — Addendum Note (Signed)
Addended by: Rogene Houston on: 10/28/2019 11:27 AM   Modules accepted: Orders

## 2019-12-13 ENCOUNTER — Encounter (INDEPENDENT_AMBULATORY_CARE_PROVIDER_SITE_OTHER): Payer: Self-pay | Admitting: *Deleted

## 2019-12-22 ENCOUNTER — Encounter (INDEPENDENT_AMBULATORY_CARE_PROVIDER_SITE_OTHER): Payer: Self-pay | Admitting: *Deleted

## 2019-12-23 ENCOUNTER — Other Ambulatory Visit (INDEPENDENT_AMBULATORY_CARE_PROVIDER_SITE_OTHER): Payer: Self-pay | Admitting: *Deleted

## 2019-12-24 ENCOUNTER — Telehealth (INDEPENDENT_AMBULATORY_CARE_PROVIDER_SITE_OTHER): Payer: Self-pay | Admitting: *Deleted

## 2019-12-24 MED ORDER — SUTAB 1479-225-188 MG PO TABS: 1.0000 | ORAL_TABLET | Freq: Once | ORAL | 0 refills | Status: AC

## 2019-12-24 NOTE — Telephone Encounter (Signed)
Patient needs Sutab (copay card) ° °

## 2020-01-11 ENCOUNTER — Other Ambulatory Visit (HOSPITAL_COMMUNITY): Payer: 59

## 2020-01-13 ENCOUNTER — Ambulatory Visit (HOSPITAL_COMMUNITY): Admit: 2020-01-13 | Payer: 59 | Admitting: Internal Medicine

## 2020-01-13 ENCOUNTER — Encounter (HOSPITAL_COMMUNITY): Payer: Self-pay

## 2020-01-13 SURGERY — COLONOSCOPY
Anesthesia: Moderate Sedation

## 2020-02-26 ENCOUNTER — Other Ambulatory Visit: Payer: Self-pay

## 2020-02-26 ENCOUNTER — Emergency Department (HOSPITAL_COMMUNITY)
Admission: EM | Admit: 2020-02-26 | Discharge: 2020-02-26 | Disposition: A | Discharge status: Home / Self Care | Payer: 59 | Attending: Emergency Medicine | Admitting: Emergency Medicine

## 2020-02-26 ENCOUNTER — Emergency Department (HOSPITAL_COMMUNITY): Payer: 59

## 2020-02-26 ENCOUNTER — Encounter (HOSPITAL_COMMUNITY): Payer: Self-pay | Admitting: Emergency Medicine

## 2020-02-26 DIAGNOSIS — W1830XA Fall on same level, unspecified, initial encounter: Secondary | ICD-10-CM | POA: Insufficient documentation

## 2020-02-26 DIAGNOSIS — Y929 Unspecified place or not applicable: Secondary | ICD-10-CM | POA: Diagnosis not present

## 2020-02-26 DIAGNOSIS — F17210 Nicotine dependence, cigarettes, uncomplicated: Secondary | ICD-10-CM | POA: Insufficient documentation

## 2020-02-26 DIAGNOSIS — S52611A Displaced fracture of right ulna styloid process, initial encounter for closed fracture: Secondary | ICD-10-CM | POA: Diagnosis not present

## 2020-02-26 DIAGNOSIS — S62101A Fracture of unspecified carpal bone, right wrist, initial encounter for closed fracture: Secondary | ICD-10-CM

## 2020-02-26 DIAGNOSIS — Y999 Unspecified external cause status: Secondary | ICD-10-CM | POA: Insufficient documentation

## 2020-02-26 DIAGNOSIS — S52571A Other intraarticular fracture of lower end of right radius, initial encounter for closed fracture: Secondary | ICD-10-CM | POA: Insufficient documentation

## 2020-02-26 DIAGNOSIS — Z79899 Other long term (current) drug therapy: Secondary | ICD-10-CM | POA: Insufficient documentation

## 2020-02-26 DIAGNOSIS — Y939 Activity, unspecified: Secondary | ICD-10-CM | POA: Insufficient documentation

## 2020-02-26 DIAGNOSIS — W541XXA Struck by dog, initial encounter: Secondary | ICD-10-CM | POA: Diagnosis not present

## 2020-02-26 DIAGNOSIS — S6991XA Unspecified injury of right wrist, hand and finger(s), initial encounter: Secondary | ICD-10-CM | POA: Diagnosis present

## 2020-02-26 MED ORDER — HYDROCODONE-ACETAMINOPHEN 5-325 MG PO TABS: 1.0000 | ORAL_TABLET | Freq: Four times a day (QID) | ORAL | 0 refills | Status: DC | PRN

## 2020-02-26 MED ORDER — OXYCODONE-ACETAMINOPHEN 5-325 MG PO TABS: 1.0000 | ORAL_TABLET | Freq: Four times a day (QID) | ORAL | 0 refills | Status: DC | PRN

## 2020-02-26 MED ORDER — HYDROMORPHONE HCL 1 MG/ML IJ SOLN
1.0000 mg | Freq: Once | INTRAMUSCULAR | Status: AC
  Administered 2020-02-26: 1 mg via INTRAMUSCULAR
  Filled 2020-02-26: qty 1

## 2020-02-26 NOTE — ED Provider Notes (Signed)
[facility] EMERGENCY DEPARTMENT Provider Note   CSN: [id number] Arrival date & time: 02/26/20  2100     History Chief Complaint  Patient presents with  . Wrist Injury    Madison Walsh is a 59 y.o. female.  Patient fell on her right wrist today.  Patient complains of pain to the wrist no other injury  The history is provided by the patient. No language interpreter was used.  Wrist Injury Upper extremity pain location: Right wrist. Injury: yes   Mechanism of injury: fall   Fall:    Fall occurred: Standing.   Height of fall:  Standing   Impact surface:  Hard floor   Point of impact: Wrist.   Entrapped after fall: no   Pain details:    Quality:  Aching   Radiates to:  Does not radiate   Severity:  Moderate   Onset quality:  Sudden   Timing:  Constant Associated symptoms: no back pain and no fatigue        Past Medical History:  Diagnosis Date  . Constipation   . GERD (gastroesophageal reflux disease)   . PONV (postoperative nausea and vomiting)   . Ulcer of esophagus     Patient Active Problem List   Diagnosis Date Noted  . Erosive esophagitis 03/08/2013  . Unspecified constipation 03/08/2013    Past Surgical History:  Procedure Laterality Date  . CERVICAL CONIZATION W/BX  1983  . COLONOSCOPY  06/27/2011   Procedure: COLONOSCOPY;  Surgeon: Rogene Houston, MD;  [facility];  Service: Endoscopy;  Laterality: N/A;  12:00  . ESOPHAGOGASTRODUODENOSCOPY N/A 08/10/2015   Procedure: ESOPHAGOGASTRODUODENOSCOPY (EGD);  Surgeon: Rogene Houston, MD;  [facility];  Service: Endoscopy;  Laterality: N/A;  1:00     OB History   No obstetric history on file.     Family History  Problem Relation Age of Onset  . CVA Mother   . Esophageal varices Sister   . Hiatal hernia Sister   . Colon cancer Maternal Uncle     Social History   Tobacco Use  . Smoking status: Current Every Day Smoker    Packs/day: 1.00    Years: 30.00    Pack years:  30.00    Types: Cigarettes  . Smokeless tobacco: Never Used  . Tobacco comment: 1 pack a day  Substance Use Topics  . Alcohol use: No    Alcohol/week: 0.0 standard drinks  . Drug use: No    Home Medications Prior to Admission medications   Medication Sig Start Date End Date Taking? Authorizing Provider  acetaminophen (TYLENOL) 325 MG tablet Take 650 mg by mouth every 6 (six) hours as needed for fever.    [provider]  calcium carbonate (TUMS - DOSED IN MG ELEMENTAL CALCIUM) 500 MG chewable tablet Chew 2 tablets by mouth daily as needed for indigestion or heartburn.    [provider]  Calcium Citrate-Vitamin D (CALCIUM CITRATE + D PO) Take 1 tablet by mouth daily.     [provider]  cetirizine (ZYRTEC) 10 MG tablet Take 10 mg by mouth daily.    [provider]  Cholecalciferol (VITAMIN D) 2000 UNITS tablet Take 1,000 Units by mouth daily.     [provider]  docusate sodium (COLACE) 100 MG capsule Take 100 mg by mouth daily.    [provider]  HYDROcodone-acetaminophen (NORCO/VICODIN) 5-325 MG tablet Take 1 tablet by mouth every 6 (six) hours as needed. 02/26/20  Milton Ferguson, MD  magnesium hydroxide (MILK OF MAGNESIA) 400 MG/5ML suspension Take 15 mLs by mouth daily as needed. For constipation     [provider]  Multiple Vitamin (MULTI-VITAMIN PO) Take by mouth daily.    [provider]  omeprazole (PRILOSEC) 20 MG capsule Take 1 capsule by mouth 2 (two) times daily. 07/25/15   [provider]  sucralfate (CARAFATE) 1 g tablet Take 1 tablet (1 g total) by mouth 4 (four) times daily -  with meals and at bedtime. Patient taking differently: Take 1 g by mouth as needed.  09/19/15   Setzer, Rona Ravens, NP    Allergies    Patient has no known allergies.  Review of Systems   Review of Systems  Constitutional: Negative for appetite change and fatigue.  HENT: Negative for congestion, ear discharge and  sinus pressure.   Eyes: Negative for discharge.  Respiratory: Negative for cough.   Cardiovascular: Negative for chest pain.  Gastrointestinal: Negative for abdominal pain and diarrhea.  Genitourinary: Negative for frequency and hematuria.  Musculoskeletal: Negative for back pain.       Right wrist pain  Skin: Negative for rash.  Neurological: Negative for seizures and headaches.  Psychiatric/Behavioral: Negative for hallucinations.    Physical Exam Updated Vital Signs BP 113/79 (BP Location: Left Arm)   Pulse 78   Temp 98.5 F (36.9 C) (Oral)   Resp 16   Ht 5\' 3"  (1.6 m)   Wt 54.4 kg   SpO2 95%   BMI 21.26 kg/m   Physical Exam Vitals and nursing note reviewed.  Constitutional:      Appearance: She is well-developed.  HENT:     Head: Normocephalic.     Nose: Nose normal.  Eyes:     General: No scleral icterus.    Conjunctiva/sclera: Conjunctivae normal.  Neck:     Thyroid: No thyromegaly.     Trachea: No tracheal deviation.  Cardiovascular:     Rate and Rhythm: Normal rate and regular rhythm.     Heart sounds: No murmur heard.  No friction rub. No gallop.   Pulmonary:     Breath sounds: No stridor. No wheezing or rales.  Chest:     Chest wall: No tenderness.  Abdominal:     General: There is no distension.     Tenderness: There is no abdominal tenderness. There is no rebound.  Musculoskeletal:     Cervical back: Neck supple.     Comments: Patient has a deformity to the right wrist.  Good radial pulse.  Lymphadenopathy:     Cervical: No cervical adenopathy.  Skin:    General: Skin is warm.     Findings: No erythema or rash.  Neurological:     Mental Status: She is alert and oriented to person, place, and time.     Motor: No abnormal muscle tone.     Coordination: Coordination normal.  Psychiatric:        Behavior: Behavior normal.     ED Results / Procedures / Treatments   Labs (all labs ordered are listed, but only abnormal results are  displayed) Labs Reviewed - No data to display  EKG None  Radiology DG Wrist Complete Right  Result Date: 02/26/2020 CLINICAL DATA:  59 year old female with right wrist injury. EXAM: RIGHT WRIST - COMPLETE 3+ VIEW COMPARISON:  None. FINDINGS: Comminuted and displaced intra-articular fracture of the distal radius with volar displacement of the distal fracture fragment. There is resulting positive ulnar variance.  Mildly displaced fracture of the ulnar styloid. The bones are osteopenic. No dislocation. There is soft tissue swelling of the wrist. IMPRESSION: 1. Comminuted and displaced intra-articular fracture of the distal radius. 2. Mildly displaced ulnar styloid fracture. Electronically Signed   By: Anner Crete M.D.   On: 02/26/2020 21:54    Procedures Procedures (including critical care time)  Medications Ordered in ED Medications  HYDROmorphone (DILAUDID) injection 1 mg (1 mg Intramuscular Given 02/26/20 2142)    ED Course  I have reviewed the triage vital signs and the nursing notes.  Pertinent labs & imaging results that were available during my care of the patient were reviewed by me and considered in my medical decision making (see chart for details).    MDM Rules/Calculators/A&P                          Patient with right wrist fracture.  She is placed in a sugar tong splint and will follow up with orthopedics in the next couple days.  She will keep her wrist elevated I discussed the patient with Dr. Aline Brochure and he will follow-up with the patient this week       This patient presents to the ED for concern of fall, this involves an extensive number of treatment options, and is a complaint that carries with it a high risk of complications and morbidity.  The differential diagnosis includes wrist fracture   Lab Tests:   Medicines ordered:   I ordered medication Dilaudid for pain  Imaging Studies ordered:   I ordered imaging studies which included wrist x-ray  and  I independently visualized and interpreted imaging which showed fracture displaced radius  Additional history obtained:   Additional history obtained from patient  Previous records obtained and reviewed.  Consultations Obtained:   I consulted orthopedics and discussed lab and imaging findings  Reevaluation:  After the interventions stated above, I reevaluated the patient and found mild improvement  Critical Interventions:  .   Final Clinical Impression(s) / ED Diagnoses Final diagnoses:  Wrist fracture, closed, right, initial encounter    Rx / DC Orders ED Discharge Orders         Ordered    HYDROcodone-acetaminophen (NORCO/VICODIN) 5-325 MG tablet  Every 6 hours PRN     Discontinue  Reprint     02/26/20 2255           Milton Ferguson, MD 02/28/20 1131

## 2020-02-26 NOTE — ED Triage Notes (Signed)
Pt states her dog knocked her down causing her to injure her right wrist.

## 2020-02-26 NOTE — Discharge Instructions (Signed)
Follow-up with Dr. Aline Brochure this week.  Call Monday for an appointment either Monday or Tuesday.  Keep arm elevated above your heart

## 2020-02-28 MED FILL — Oxycodone w/ Acetaminophen Tab 5-325 MG: ORAL | Qty: 6 | Status: AC

## 2020-03-01 ENCOUNTER — Encounter: Payer: Self-pay | Admitting: Orthopedic Surgery

## 2020-03-01 ENCOUNTER — Other Ambulatory Visit: Payer: Self-pay

## 2020-03-01 ENCOUNTER — Ambulatory Visit (INDEPENDENT_AMBULATORY_CARE_PROVIDER_SITE_OTHER): Payer: 59 | Admitting: Orthopedic Surgery

## 2020-03-01 VITALS — BP 110/64 | HR 74 | Ht 62.0 in | Wt 122.0 lb

## 2020-03-01 DIAGNOSIS — S52561A Barton's fracture of right radius, initial encounter for closed fracture: Secondary | ICD-10-CM

## 2020-03-01 NOTE — Patient Instructions (Signed)
Wrist Fracture Treated With ORIF A wrist fracture is a break or crack in one of the bones of the wrist. The wrist is made up of eight small bones at the palm of the hand (carpal bones) and two long bones that make up the forearm (radius and ulna). If the fracture is displaced, it means that one or more parts of a bone have been moved out of their normal position and the bones are not lined up correctly. Open reduction with internal fixation (ORIF) is a surgical procedure that is used to treat a displaced wrist fracture. In this procedure, a surgeon will put the bone pieces back together. The surgeon will put in plates, screws, or other types of hardware to hold the bones in place. The procedure helps the bones heal properly. Tell a health care provider about:  Any allergies you have.  All medicines you are taking, including vitamins, herbs, eye drops, creams, and over-the-counter medicines.  Any problems you or family members have had with anesthetic medicines.  Any blood disorders you have.  Any surgeries you have had.  Any medical conditions you have.  Whether you are pregnant or may be pregnant. What are the risks? Generally, this is a safe procedure. However, problems may occur, including:  Infection.  Bleeding.  Failure of the bone to heal.  Wrist stiffness.  Damage to other structures.  Allergic reactions to medicines. What happens before the procedure? Staying hydrated Follow instructions from your health care provider about hydration, which may include:  Up to 2 hours before the procedure - you may continue to drink clear liquids, such as water, clear fruit juice, black coffee, and plain tea.  Eating and drinking restrictions Follow instructions from your health care provider about eating and drinking, which may include:  8 hours before the procedure - stop eating heavy meals or foods, such as meat, fried foods, or fatty foods.  6 hours before the procedure - stop  eating light meals or foods, such as toast or cereal.  6 hours before the procedure - stop drinking milk or drinks that contain milk.  2 hours before the procedure - stop drinking clear liquids. Medicines Ask your health care provider about:  Changing or stopping your regular medicines. This is especially important if you are taking diabetes medicines or blood thinners.  Taking medicines such as aspirin and ibuprofen. These medicines can thin your blood. Do not take these medicines unless your health care provider tells you to take them.  Taking over-the-counter medicines, vitamins, herbs, and supplements. General instructions  Plan to have someone take you home from the hospital or clinic.  Plan to have a responsible adult care for you for at least 24 hours after you leave the hospital or clinic. This is important.  Ask your health care provider what steps will be taken to help prevent infection. These may include: ? Removing hair at the surgery site. ? Washing skin with a germ-killing soap. ? Taking antibiotic medicine. What happens during the procedure?   An IV will be inserted into one of your veins.  You will be given one or more of the following: ? A medicine to help you relax (sedative). ? A medicine to numb the area (local anesthetic). ? A medicine to make you fall asleep (general anesthetic). ? A medicine that is injected into an area of your body to numb everything below the injection site (regional anesthetic).  The surgeon will make an incision through your skin over the  area of the fracture.  The broken bones will be returned to their normal positions. To hold the bones in place, the surgeon will use screws and a metal plate or different types of wiring.  The surgeon will close the incision with stitches (sutures) or staples.  A bandage (dressing) will be placed over your incision.  A splint or cast may be placed over your dressing. The procedure may vary among  health care providers and hospitals. What happens after the procedure?  Your blood pressure, heart rate, breathing rate, and blood oxygen level will be monitored until you leave the hospital or clinic.  You will be given medicine as needed for pain.  Your wrist will be placed on pillows to keep it raised (elevated). This will help prevent swelling.  You may have physical therapy while you are in the hospital.  You may be sent home with a sling to support your arm.  Do not drive until your health care provider approves. Summary  If a wrist fracture is displaced, it means that one or more parts of a bone have been moved out of their normal position and the bones are not lined up correctly.  A displaced wrist fracture will be treated with a surgical procedure that is called open reduction with internal fixation (ORIF).  The bone pieces are put back together and held in place by plates, screws, or other types of hardware.  Follow instructions from your health care provider about eating and drinking before the procedure. This information is not intended to replace advice given to you by your health care provider. Make sure you discuss any questions you have with your health care provider. Document Revised: 01/13/2018 Document Reviewed: 01/13/2018 Elsevier Patient Education  [place].

## 2020-03-01 NOTE — Patient Instructions (Signed)
ROSALEEN MAZER  03/01/2020     @PREFPERIOPPHARMACY @   Your procedure is scheduled on  03/03/2020 .  Report to [facility] at  1100  A.M.  Call this number if you have problems the morning of surgery:  [phone]   Remember:  Do not eat or drink after midnight.                        Take these medicines the morning of surgery with A SIP OF WATER  Hydrocodone(if needed), zyrtec, prilosec.    Do not wear jewelry, make-up or nail polish.  Do not wear lotions, powders, or perfumes, or deodorant. Please brush your teeth.  Do not shave 48 hours prior to surgery.  Men may shave face and neck.  Do not bring valuables to the hospital.  [facility] is not responsible for any belongings or valuables.  Contacts, dentures or bridgework may not be worn into surgery.  Leave your suitcase in the car.  After surgery it may be brought to your room.  For patients admitted to the hospital, discharge time will be determined by your treatment team.  Patients discharged the day of surgery will not be allowed to drive home.   Name and phone number of your driver:   family Special instructions:  DO NOT smoke the morning of your procedure.  Please read over the following fact sheets that you were given. Anesthesia Post-op Instructions and Care and Recovery After Surgery       Wrist Fracture Treated With ORIF, Care After This sheet gives you information about how to care for yourself after your procedure. Your health care provider may also give you more specific instructions. If you have problems or questions, contact your health care provider. What can I expect after the procedure? After the procedure, it is common to have:  Pain.  Swelling.  Stiffness.  A small amount of drainage from the incision. Follow these instructions at home: If you have a cast:  Do not stick anything inside the cast to scratch your skin. Doing that increases your risk of infection.  Check the skin  around the cast every day. Tell your health care provider about any concerns.  You may put lotion on dry skin around the edges of the cast. Do not put lotion on the skin underneath the cast.  Keep the cast clean and dry. If you have a splint or sling:  Wear the splint or sling as told by your health care provider. Remove it only as told by your health care provider.  Loosen the splint or sling if your fingers tingle, become numb, or turn cold and blue.  Keep the splint or sling clean and dry. Bathing  Do not take baths, swim, or use a hot tub until your health care provider approves. Ask your health care provider if you may take showers. You may only be allowed to take sponge baths.  If your cast, splint, or sling is not waterproof: ? Do not let it get wet. ? Cover it with a watertight covering when you take a bath or shower.  If you have a sling, remove it for bathing only if your health care provider tells you that it is safe to do so.  Keep the bandage (dressing) dry until your health care provider says it can be removed. Incision care   Follow instructions from your health care provider about how to take  care of your incision. Make sure you: ? Wash your hands with soap and water before you change your bandage (dressing). If soap and water are not available, use hand sanitizer. ? Change your dressing as told by your health care provider. ? Leave stitches (sutures), skin glue, or adhesive strips in place. These skin closures may need to stay in place for 2 weeks or longer. If adhesive strip edges start to loosen and curl up, you may trim the loose edges. Do not remove adhesive strips completely unless your health care provider tells you to do that.  Check your incision area every day for signs of infection. Check for: ? Redness. ? More swelling or pain. ? Blood or more fluid. ? Warmth. ? Pus or a bad smell. Managing pain, stiffness, and swelling   If directed, put ice on the  injured area. ? If you have a removable splint or sling, remove it as told by your health care provider. ? Put ice in a plastic bag. ? Place a towel between your skin and the bag or between your cast and the bag. ? Leave the ice on for 20 minutes, 2-3 times a day.  Move your fingers often to avoid stiffness and to lessen swelling.  Raise (elevate) the injured area above the level of your heart while you are sitting or lying down. Driving  Do not drive or use heavy machinery while taking prescription pain medicine.  Do not drive for 24 hours if you were given a sedative during your procedure.  Ask your health care provider when it is safe to drive if you have a cast, splint, or sling on your wrist. Activity  Return to your normal activities as told by your health care provider. Ask your health care provider what activities are safe for you.  Do exercises as told by your health care provider.  Do not lift with your injured wrist until your health care provider approves.  Avoid pulling and pushing. General instructions  Do not put pressure on any part of the cast or splint until it is fully hardened. This may take several hours.  Take over-the-counter and prescription medicines only as told by your health care provider.  Do not use any products that contain nicotine or tobacco, such as cigarettes and e-cigarettes. These can delay bone healing after surgery. If you need help quitting, ask your health care provider.  If you were prescribed pain medicine, take steps to prevent or treat constipation. Your health care provider may recommend that you: ? Drink enough fluid to keep your urine pale yellow. ? Take over-the-counter or prescription medicines. ? Eat foods that are high in fiber, such as beans, whole grains, and fresh fruits and vegetables. ? Limit foods that are high in fat and processed sugars, such as fried or sweet foods.  Keep all follow-up visits as told by your health  care provider. This is important. Contact a health care provider if:  Your cast, splint, or sling is damaged or loose.  Your pain is not controlled with medicine.  You have a fever.  You have redness around your incision.  You have more swelling or pain around your incision.  You have blood or more fluid coming from your incision.  Your incision feels warm to the touch.  You have pus or a bad smell coming from your incision or your dressing.  You develop a rash. Get help right away if:  Your skin or fingers on your injured arm  turn blue or gray.  Your arm feels cold or numb.  You have severe pain in your injured wrist.  You have trouble breathing.  You feel faint or light-headed. Summary  After the procedure, it is common to have pain, swelling, stiffness, and a small amount of drainage from the incision.  You may use ice, elevation, and pain medicine as told by your health care provider to lessen pain and swelling.  Wear your splint or sling as told by your health care provider.  Do not lift with your injured wrist until your health care provider approves. This information is not intended to replace advice given to you by your health care provider. Make sure you discuss any questions you have with your health care provider. Document Revised: 01/13/2018 Document Reviewed: 01/13/2018 Elsevier Patient Education  [place] Anesthesia, Adult, Care After This sheet gives you information about how to care for yourself after your procedure. Your health care provider may also give you more specific instructions. If you have problems or questions, contact your health care provider. What can I expect after the procedure? After the procedure, the following side effects are common:  Pain or discomfort at the IV site.  Nausea.  Vomiting.  Sore throat.  Trouble concentrating.  Feeling cold or chills.  Weak or tired.  Sleepiness and  fatigue.  Soreness and body aches. These side effects can affect parts of the body that were not involved in surgery. Follow these instructions at home:  For at least 24 hours after the procedure:  Have a responsible adult stay with you. It is important to have someone help care for you until you are awake and alert.  Rest as needed.  Do not: ? Participate in activities in which you could fall or become injured. ? Drive. ? Use heavy machinery. ? Drink alcohol. ? Take sleeping pills or medicines that cause drowsiness. ? Make important decisions or sign legal documents. ? Take care of children on your own. Eating and drinking  Follow any instructions from your health care provider about eating or drinking restrictions.  When you feel hungry, start by eating small amounts of foods that are soft and easy to digest (bland), such as toast. Gradually return to your regular diet.  Drink enough fluid to keep your urine pale yellow.  If you vomit, rehydrate by drinking water, juice, or clear broth. General instructions  If you have sleep apnea, surgery and certain medicines can increase your risk for breathing problems. Follow instructions from your health care provider about wearing your sleep device: ? Anytime you are sleeping, including during daytime naps. ? While taking prescription pain medicines, sleeping medicines, or medicines that make you drowsy.  Return to your normal activities as told by your health care provider. Ask your health care provider what activities are safe for you.  Take over-the-counter and prescription medicines only as told by your health care provider.  If you smoke, do not smoke without supervision.  Keep all follow-up visits as told by your health care provider. This is important. Contact a health care provider if:  You have nausea or vomiting that does not get better with medicine.  You cannot eat or drink without vomiting.  You have pain that  does not get better with medicine.  You are unable to pass urine.  You develop a skin rash.  You have a fever.  You have redness around your IV site that gets worse. Get help right away  if:  You have difficulty breathing.  You have chest pain.  You have blood in your urine or stool, or you vomit blood. Summary  After the procedure, it is common to have a sore throat or nausea. It is also common to feel tired.  Have a responsible adult stay with you for the first 24 hours after general anesthesia. It is important to have someone help care for you until you are awake and alert.  When you feel hungry, start by eating small amounts of foods that are soft and easy to digest (bland), such as toast. Gradually return to your regular diet.  Drink enough fluid to keep your urine pale yellow.  Return to your normal activities as told by your health care provider. Ask your health care provider what activities are safe for you. This information is not intended to replace advice given to you by your health care provider. Make sure you discuss any questions you have with your health care provider. Document Revised: 08/29/2017 Document Reviewed: 04/11/2017 Elsevier Patient Education  [place]. How to Use Chlorhexidine for Bathing Chlorhexidine gluconate (CHG) is a germ-killing (antiseptic) solution that is used to clean the skin. It can get rid of the bacteria that normally live on the skin and can keep them away for about 24 hours. To clean your skin with CHG, you may be given:  A CHG solution to use in the shower or as part of a sponge bath.  A prepackaged cloth that contains CHG. Cleaning your skin with CHG may help lower the risk for infection:  While you are staying in the intensive care unit of the hospital.  If you have a vascular access, such as a central line, to provide short-term or long-term access to your veins.  If you have a catheter to drain urine from your  bladder.  If you are on a ventilator. A ventilator is a machine that helps you breathe by moving air in and out of your lungs.  After surgery. What are the risks? Risks of using CHG include:  A skin reaction.  Hearing loss, if CHG gets in your ears.  Eye injury, if CHG gets in your eyes and is not rinsed out.  The CHG product catching fire. Make sure that you avoid smoking and flames after applying CHG to your skin. Do not use CHG:  If you have a chlorhexidine allergy or have previously reacted to chlorhexidine.  On babies younger than 23 months of age. How to use CHG solution  Use CHG only as told by your health care provider, and follow the instructions on the label.  Use the full amount of CHG as directed. Usually, this is one bottle. During a shower Follow these steps when using CHG solution during a shower (unless your health care provider gives you different instructions): 1. Start the shower. 2. Use your normal soap and shampoo to wash your face and hair. 3. Turn off the shower or move out of the shower stream. 4. Pour the CHG onto a clean washcloth. Do not use any type of brush or rough-edged sponge. 5. Starting at your neck, lather your body down to your toes. Make sure you follow these instructions: ? If you will be having surgery, pay special attention to the part of your body where you will be having surgery. Scrub this area for at least 1 minute. ? Do not use CHG on your head or face. If the solution gets into your ears or  eyes, rinse them well with water. ? Avoid your genital area. ? Avoid any areas of skin that have broken skin, cuts, or scrapes. ? Scrub your back and under your arms. Make sure to wash skin folds. 6. Let the lather sit on your skin for 1-2 minutes or as long as told by your health care provider. 7. Thoroughly rinse your entire body in the shower. Make sure that all body creases and crevices are rinsed well. 8. Dry off with a clean towel. Do not  put any substances on your body afterward--such as powder, lotion, or perfume--unless you are told to do so by your health care provider. Only use lotions that are recommended by the manufacturer. 9. Put on clean clothes or pajamas. 10. If it is the night before your surgery, sleep in clean sheets.  During a sponge bath Follow these steps when using CHG solution during a sponge bath (unless your health care provider gives you different instructions): 1. Use your normal soap and shampoo to wash your face and hair. 2. Pour the CHG onto a clean washcloth. 3. Starting at your neck, lather your body down to your toes. Make sure you follow these instructions: ? If you will be having surgery, pay special attention to the part of your body where you will be having surgery. Scrub this area for at least 1 minute. ? Do not use CHG on your head or face. If the solution gets into your ears or eyes, rinse them well with water. ? Avoid your genital area. ? Avoid any areas of skin that have broken skin, cuts, or scrapes. ? Scrub your back and under your arms. Make sure to wash skin folds. 4. Let the lather sit on your skin for 1-2 minutes or as long as told by your health care provider. 5. Using a different clean, wet washcloth, thoroughly rinse your entire body. Make sure that all body creases and crevices are rinsed well. 6. Dry off with a clean towel. Do not put any substances on your body afterward--such as powder, lotion, or perfume--unless you are told to do so by your health care provider. Only use lotions that are recommended by the manufacturer. 7. Put on clean clothes or pajamas. 8. If it is the night before your surgery, sleep in clean sheets. How to use CHG prepackaged cloths  Only use CHG cloths as told by your health care provider, and follow the instructions on the label.  Use the CHG cloth on clean, dry skin.  Do not use the CHG cloth on your head or face unless your health care provider  tells you to.  When washing with the CHG cloth: ? Avoid your genital area. ? Avoid any areas of skin that have broken skin, cuts, or scrapes. Before surgery Follow these steps when using a CHG cloth to clean before surgery (unless your health care provider gives you different instructions): 1. Using the CHG cloth, vigorously scrub the part of your body where you will be having surgery. Scrub using a back-and-forth motion for 3 minutes. The area on your body should be completely wet with CHG when you are done scrubbing. 2. Do not rinse. Discard the cloth and let the area air-dry. Do not put any substances on the area afterward, such as powder, lotion, or perfume. 3. Put on clean clothes or pajamas. 4. If it is the night before your surgery, sleep in clean sheets.  For general bathing Follow these steps when using CHG cloths for  general bathing (unless your health care provider gives you different instructions). 1. Use a separate CHG cloth for each area of your body. Make sure you wash between any folds of skin and between your fingers and toes. Wash your body in the following order, switching to a new cloth after each step: ? The front of your neck, shoulders, and chest. ? Both of your arms, under your arms, and your hands. ? Your stomach and groin area, avoiding the genitals. ? Your right leg and foot. ? Your left leg and foot. ? The back of your neck, your back, and your buttocks. 2. Do not rinse. Discard the cloth and let the area air-dry. Do not put any substances on your body afterward--such as powder, lotion, or perfume--unless you are told to do so by your health care provider. Only use lotions that are recommended by the manufacturer. 3. Put on clean clothes or pajamas. Contact a health care provider if:  Your skin gets irritated after scrubbing.  You have questions about using your solution or cloth. Get help right away if:  Your eyes become very red or swollen.  Your eyes  itch badly.  Your skin itches badly and is red or swollen.  Your hearing changes.  You have trouble seeing.  You have swelling or tingling in your mouth or throat.  You have trouble breathing.  You swallow any chlorhexidine. Summary  Chlorhexidine gluconate (CHG) is a germ-killing (antiseptic) solution that is used to clean the skin. Cleaning your skin with CHG may help to lower your risk for infection.  You may be given CHG to use for bathing. It may be in a bottle or in a prepackaged cloth to use on your skin. Carefully follow your health care provider's instructions and the instructions on the product label.  Do not use CHG if you have a chlorhexidine allergy.  Contact your health care provider if your skin gets irritated after scrubbing. This information is not intended to replace advice given to you by your health care provider. Make sure you discuss any questions you have with your health care provider. Document Revised: 11/12/2018 Document Reviewed: 07/24/2017 Elsevier Patient Education  [place].

## 2020-03-01 NOTE — Progress Notes (Signed)
Chief Complaint  Patient presents with  . Wrist Injury    ER follow up right wrist fracture, DOI 02-26-20.   59 year old female with osteoporosis currently on vitamin D presents with right distal radius fracture secondary to a fall caused by her dog.  Complains of pain and swelling of the right wrist occasional tingling depending on position of the arm in the sling   Mid face erythema with telangiectasias +/- scattered inflammatory papules.    Review of Systems  Neurological: Positive for tingling.  All other systems reviewed and are negative.  Past Medical History:  Diagnosis Date  . Constipation   . GERD (gastroesophageal reflux disease)   . PONV (postoperative nausea and vomiting)   . Ulcer of esophagus    Past Surgical History:  Procedure Laterality Date  . CERVICAL CONIZATION W/BX  1983  . COLONOSCOPY  06/27/2011   Procedure: COLONOSCOPY;  Surgeon: Rogene Houston, MD;  Location: [facility];  Service: Endoscopy;  Laterality: N/A;  12:00  . ESOPHAGOGASTRODUODENOSCOPY N/A 08/10/2015   Procedure: ESOPHAGOGASTRODUODENOSCOPY (EGD);  Surgeon: Rogene Houston, MD;  Location: [facility];  Service: Endoscopy;  Laterality: N/A;  1:00   Family History  Problem Relation Age of Onset  . CVA Mother   . Esophageal varices Sister   . Hiatal hernia Sister   . Colon cancer Maternal Uncle     Social History   Tobacco Use  . Smoking status: Current Every Day Smoker    Packs/day: 1.00    Years: 30.00    Pack years: 30.00    Types: Cigarettes  . Smokeless tobacco: Never Used  . Tobacco comment: 1 pack a day  Substance Use Topics  . Alcohol use: No    Alcohol/week: 0.0 standard drinks  . Drug use: No    BP 110/64   Pulse 74   Ht 5\' 2"  (1.575 m)   Wt 122 lb (55.3 kg)   BMI 22.31 kg/m   Mood affect normal appearance normal small body habitus  Awake alert and oriented x3  Gait and station normal  Splint was removed from the right wrist swollen right hand with  deformity sensation is intact color capillary refill normal tenderness over the fracture site she can move the fingers the wrist is painful to move elbow is nontender shoulder and humerus nontender  Ulnar styloid tender  X-rays from the hospital show a volar displaced fracture distal radius including ulnar styloid with dorsiflexion of the lunate but no intercarpal abnormality seen  Diagnosis  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 Alternative treatment has also been reviewed, questions were encouraged and answered. The postoperative plan is also been reviewed.

## 2020-03-02 ENCOUNTER — Encounter (HOSPITAL_COMMUNITY)
Admission: RE | Admit: 2020-03-02 | Discharge: 2020-03-02 | Disposition: A | Discharge status: Home / Self Care | Payer: 59 | Source: Ambulatory Visit | Attending: Orthopedic Surgery | Admitting: Orthopedic Surgery

## 2020-03-02 ENCOUNTER — Other Ambulatory Visit (HOSPITAL_COMMUNITY)
Admission: RE | Admit: 2020-03-02 | Discharge: 2020-03-02 | Disposition: A | Discharge status: Home / Self Care | Payer: 59 | Source: Ambulatory Visit | Attending: Orthopedic Surgery | Admitting: Orthopedic Surgery

## 2020-03-02 DIAGNOSIS — Z01812 Encounter for preprocedural laboratory examination: Secondary | ICD-10-CM | POA: Diagnosis not present

## 2020-03-02 DIAGNOSIS — Z20822 Contact with and (suspected) exposure to covid-19: Secondary | ICD-10-CM | POA: Diagnosis not present

## 2020-03-02 LAB — CBC WITH DIFFERENTIAL/PLATELET
Abs Immature Granulocytes: 0.04 10*3/uL (ref 0.00–0.07)
Basophils Absolute: 0.1 10*3/uL (ref 0.0–0.1)
Basophils Relative: 1 %
Eosinophils Absolute: 0.1 10*3/uL (ref 0.0–0.5)
Eosinophils Relative: 1 %
HCT: 46.1 % — ABNORMAL HIGH (ref 36.0–46.0)
Hemoglobin: 14.9 g/dL (ref 12.0–15.0)
Immature Granulocytes: 0 %
Lymphocytes Relative: 18 %
Lymphs Abs: 2.1 10*3/uL (ref 0.7–4.0)
MCH: 31.2 pg (ref 26.0–34.0)
MCHC: 32.3 g/dL (ref 30.0–36.0)
MCV: 96.6 fL (ref 80.0–100.0)
Monocytes Absolute: 0.7 10*3/uL (ref 0.1–1.0)
Monocytes Relative: 6 %
Neutro Abs: 8.4 10*3/uL — ABNORMAL HIGH (ref 1.7–7.7)
Neutrophils Relative %: 74 %
Platelets: 263 10*3/uL (ref 150–400)
RBC: 4.77 MIL/uL (ref 3.87–5.11)
RDW: 14.1 % (ref 11.5–15.5)
WBC: 11.5 10*3/uL — ABNORMAL HIGH (ref 4.0–10.5)
nRBC: 0 % (ref 0.0–0.2)

## 2020-03-02 LAB — BASIC METABOLIC PANEL
Anion gap: 10 (ref 5–15)
BUN: 13 mg/dL (ref 6–20)
CO2: 28 mmol/L (ref 22–32)
Calcium: 10.1 mg/dL (ref 8.9–10.3)
Chloride: 100 mmol/L (ref 98–111)
Creatinine, Ser: 0.73 mg/dL (ref 0.44–1.00)
GFR calc Af Amer: >60 mL/min (ref >60)
GFR calc non Af Amer: >60 mL/min (ref >60)
Glucose, Bld: 120 mg/dL — ABNORMAL HIGH (ref 70–99)
Potassium: 4.1 mmol/L (ref 3.5–5.1)
Sodium: 138 mmol/L (ref 135–145)

## 2020-03-02 LAB — SARS CORONAVIRUS 2 (TAT 6-24 HRS): SARS Coronavirus 2: NEGATIVE

## 2020-03-03 ENCOUNTER — Ambulatory Visit (HOSPITAL_COMMUNITY): Payer: 59 | Admitting: Anesthesiology

## 2020-03-03 ENCOUNTER — Other Ambulatory Visit: Payer: Self-pay

## 2020-03-03 ENCOUNTER — Encounter (HOSPITAL_COMMUNITY): Payer: Self-pay | Admitting: Orthopedic Surgery

## 2020-03-03 ENCOUNTER — Ambulatory Visit (HOSPITAL_COMMUNITY): Payer: 59

## 2020-03-03 ENCOUNTER — Ambulatory Visit (HOSPITAL_COMMUNITY)
Admission: RE | Admit: 2020-03-03 | Discharge: 2020-03-03 | Disposition: A | Discharge status: Home / Self Care | Payer: 59 | Attending: Orthopedic Surgery | Admitting: Orthopedic Surgery

## 2020-03-03 ENCOUNTER — Encounter (HOSPITAL_COMMUNITY)
Admission: RE | Disposition: A | Discharge status: Home / Self Care | Payer: Self-pay | Source: Home / Self Care | Attending: Orthopedic Surgery

## 2020-03-03 DIAGNOSIS — W19XXXA Unspecified fall, initial encounter: Secondary | ICD-10-CM | POA: Insufficient documentation

## 2020-03-03 DIAGNOSIS — S52561D Barton's fracture of right radius, subsequent encounter for closed fracture with routine healing: Secondary | ICD-10-CM

## 2020-03-03 DIAGNOSIS — S52501A Unspecified fracture of the lower end of right radius, initial encounter for closed fracture: Secondary | ICD-10-CM | POA: Diagnosis present

## 2020-03-03 DIAGNOSIS — F17210 Nicotine dependence, cigarettes, uncomplicated: Secondary | ICD-10-CM | POA: Diagnosis not present

## 2020-03-03 DIAGNOSIS — S52571A Other intraarticular fracture of lower end of right radius, initial encounter for closed fracture: Secondary | ICD-10-CM | POA: Insufficient documentation

## 2020-03-03 DIAGNOSIS — Z9889 Other specified postprocedural states: Secondary | ICD-10-CM

## 2020-03-03 DIAGNOSIS — S52561A Barton's fracture of right radius, initial encounter for closed fracture: Secondary | ICD-10-CM

## 2020-03-03 HISTORY — PX: OPEN REDUCTION INTERNAL FIXATION (ORIF) DISTAL RADIAL FRACTURE: SHX5989

## 2020-03-03 SURGERY — OPEN REDUCTION INTERNAL FIXATION (ORIF) DISTAL RADIUS FRACTURE
Anesthesia: Regional | Site: Wrist | Laterality: Right

## 2020-03-03 MED ORDER — FENTANYL CITRATE (PF) 100 MCG/2ML IJ SOLN
INTRAMUSCULAR | Status: AC
  Filled 2020-03-03: qty 2

## 2020-03-03 MED ORDER — MIDAZOLAM HCL 5 MG/5ML IJ SOLN
INTRAMUSCULAR | Status: DC | PRN
  Administered 2020-03-03: 1 mg via INTRAVENOUS

## 2020-03-03 MED ORDER — DEXAMETHASONE SODIUM PHOSPHATE 4 MG/ML IJ SOLN
INTRAMUSCULAR | Status: AC
  Filled 2020-03-03: qty 2

## 2020-03-03 MED ORDER — BUPIVACAINE HCL (PF) 0.5 % IJ SOLN
INTRAMUSCULAR | Status: AC
  Filled 2020-03-03: qty 30

## 2020-03-03 MED ORDER — DEXAMETHASONE SODIUM PHOSPHATE 4 MG/ML IJ SOLN
INTRAMUSCULAR | Status: DC | PRN
Start: 2020-03-03 — End: 2020-03-03
  Administered 2020-03-03: 8 mg via PERINEURAL

## 2020-03-03 MED ORDER — CHLORHEXIDINE GLUCONATE 0.12 % MT SOLN
15.0000 mL | Freq: Once | OROMUCOSAL | Status: AC
  Administered 2020-03-03: 15 mL via OROMUCOSAL

## 2020-03-03 MED ORDER — ORAL CARE MOUTH RINSE: 15.0000 mL | Freq: Once | OROMUCOSAL | Status: AC

## 2020-03-03 MED ORDER — ROPIVACAINE HCL 5 MG/ML IJ SOLN
INTRAMUSCULAR | Status: AC
  Filled 2020-03-03: qty 30

## 2020-03-03 MED ORDER — SODIUM CHLORIDE 0.9 % IR SOLN
Status: DC | PRN
  Administered 2020-03-03: 1000 mL

## 2020-03-03 MED ORDER — MIDAZOLAM HCL 2 MG/2ML IJ SOLN
2.0000 mg | Freq: Once | INTRAMUSCULAR | Status: AC
  Administered 2020-03-03: 2 mg via INTRAVENOUS

## 2020-03-03 MED ORDER — MIDAZOLAM HCL 2 MG/2ML IJ SOLN
INTRAMUSCULAR | Status: AC
  Filled 2020-03-03: qty 2

## 2020-03-03 MED ORDER — PROPOFOL 10 MG/ML IV BOLUS
INTRAVENOUS | Status: AC
  Filled 2020-03-03: qty 20

## 2020-03-03 MED ORDER — PROMETHAZINE HCL 25 MG/ML IJ SOLN: 6.2500 mg | INTRAMUSCULAR | Status: DC | PRN

## 2020-03-03 MED ORDER — CEFAZOLIN SODIUM-DEXTROSE 2-4 GM/100ML-% IV SOLN
2.0000 g | INTRAVENOUS | Status: AC
  Administered 2020-03-03: 2 g via INTRAVENOUS

## 2020-03-03 MED ORDER — BUPIVACAINE LIPOSOME 1.3 % IJ SUSP
INTRAMUSCULAR | Status: AC
  Filled 2020-03-03: qty 20

## 2020-03-03 MED ORDER — LACTATED RINGERS IV SOLN
INTRAVENOUS | Status: DC | PRN
Start: 2020-03-03 — End: 2020-03-03

## 2020-03-03 MED ORDER — LIDOCAINE HCL (PF) 1 % IJ SOLN
INTRAMUSCULAR | Status: DC | PRN
  Administered 2020-03-03: 3 mL

## 2020-03-03 MED ORDER — MEPERIDINE HCL 50 MG/ML IJ SOLN: 6.2500 mg | INTRAMUSCULAR | Status: DC | PRN

## 2020-03-03 MED ORDER — ROPIVACAINE HCL 5 MG/ML IJ SOLN
INTRAMUSCULAR | Status: DC | PRN
Start: 2020-03-03 — End: 2020-03-03
  Administered 2020-03-03: 23 mL via PERINEURAL

## 2020-03-03 MED ORDER — LIDOCAINE HCL (PF) 1 % IJ SOLN
INTRAMUSCULAR | Status: AC
  Filled 2020-03-03: qty 30

## 2020-03-03 MED ORDER — PROPOFOL 500 MG/50ML IV EMUL
INTRAVENOUS | Status: DC | PRN
Start: 2020-03-03 — End: 2020-03-03
  Administered 2020-03-03: 50 ug/kg/min via INTRAVENOUS

## 2020-03-03 MED ORDER — HYDROCODONE-ACETAMINOPHEN 5-325 MG PO TABS: 1.0000 | ORAL_TABLET | ORAL | 0 refills | Status: DC | PRN

## 2020-03-03 MED ORDER — HYDROMORPHONE HCL 1 MG/ML IJ SOLN: 0.2500 mg | INTRAMUSCULAR | Status: DC | PRN

## 2020-03-03 MED ORDER — KETAMINE HCL 50 MG/5ML IJ SOSY
PREFILLED_SYRINGE | INTRAMUSCULAR | Status: AC
  Filled 2020-03-03: qty 5

## 2020-03-03 MED ORDER — LACTATED RINGERS IV SOLN: Freq: Once | INTRAVENOUS | Status: AC

## 2020-03-03 MED ORDER — KETAMINE HCL 10 MG/ML IJ SOLN
INTRAMUSCULAR | Status: DC | PRN
  Administered 2020-03-03: 20 mg via INTRAVENOUS

## 2020-03-03 MED ORDER — CEFAZOLIN SODIUM-DEXTROSE 2-4 GM/100ML-% IV SOLN
INTRAVENOUS | Status: AC
  Filled 2020-03-03: qty 100

## 2020-03-03 MED ORDER — CHLORHEXIDINE GLUCONATE 0.12 % MT SOLN
OROMUCOSAL | Status: AC
  Filled 2020-03-03: qty 15

## 2020-03-03 SURGICAL SUPPLY — 51 items
APL PRP STRL LF DISP 70% ISPRP (MISCELLANEOUS) ×1
BANDAGE ESMARK 4X12 BL STRL LF (DISPOSABLE) ×1 IMPLANT
BIT DRILL 2 FAST STEP (BIT) ×2 IMPLANT
BIT DRILL 2.5X4 QC (BIT) ×2 IMPLANT
BNDG CMPR 12X4 ELC STRL LF (DISPOSABLE) ×1
BNDG CMPR STD VLCR NS LF 5.8X3 (GAUZE/BANDAGES/DRESSINGS) ×1
BNDG COHESIVE 4X5 TAN STRL (GAUZE/BANDAGES/DRESSINGS) ×3 IMPLANT
BNDG ELASTIC 3X5.8 VLCR NS LF (GAUZE/BANDAGES/DRESSINGS) ×4 IMPLANT
BNDG ESMARK 4X12 BLUE STRL LF (DISPOSABLE) ×3
CHLORAPREP W/TINT 26 (MISCELLANEOUS) ×3 IMPLANT
CLOTH BEACON ORANGE TIMEOUT ST (SAFETY) ×3 IMPLANT
COVER LIGHT HANDLE STERIS (MISCELLANEOUS) ×6 IMPLANT
COVER WAND RF STERILE (DRAPES) ×3 IMPLANT
CUFF TOURN SGL QUICK 18X4 (TOURNIQUET CUFF) ×3 IMPLANT
DRAPE C-ARM FOLDED MOBILE STRL (DRAPES) ×3 IMPLANT
DRSG XEROFORM 1X8 (GAUZE/BANDAGES/DRESSINGS) ×3 IMPLANT
ELECT REM PT RETURN 9FT ADLT (ELECTROSURGICAL) ×3
ELECTRODE REM PT RTRN 9FT ADLT (ELECTROSURGICAL) ×1 IMPLANT
GAUZE SPONGE 4X4 12PLY STRL (GAUZE/BANDAGES/DRESSINGS) ×3 IMPLANT
GLOVE BIOGEL PI IND STRL 7.0 (GLOVE) ×3 IMPLANT
GLOVE BIOGEL PI INDICATOR 7.0 (GLOVE) ×6
GLOVE ECLIPSE 7.0 STRL STRAW (GLOVE) ×4 IMPLANT
GLOVE SKINSENSE NS SZ8.0 LF (GLOVE) ×2
GLOVE SKINSENSE STRL SZ8.0 LF (GLOVE) ×1 IMPLANT
GLOVE SS N UNI LF 8.5 STRL (GLOVE) ×3 IMPLANT
GOWN STRL REUS W/TWL LRG LVL3 (GOWN DISPOSABLE) ×6 IMPLANT
GOWN STRL REUS W/TWL XL LVL3 (GOWN DISPOSABLE) ×3 IMPLANT
KIT TURNOVER KIT A (KITS) ×3 IMPLANT
MANIFOLD NEPTUNE II (INSTRUMENTS) ×3 IMPLANT
NDL HYPO 21X1.5 SAFETY (NEEDLE) ×1 IMPLANT
NEEDLE HYPO 21X1.5 SAFETY (NEEDLE) ×3 IMPLANT
NS IRRIG 1000ML POUR BTL (IV SOLUTION) ×3 IMPLANT
PACK BASIC LIMB (CUSTOM PROCEDURE TRAY) ×3 IMPLANT
PAD ARMBOARD 7.5X6 YLW CONV (MISCELLANEOUS) ×3 IMPLANT
PADDING WEBRIL 4 STERILE (GAUZE/BANDAGES/DRESSINGS) ×2 IMPLANT
PEG SUBCHONDRAL SMOOTH 2.0X18 (Peg) ×6 IMPLANT
PEG SUBCHONDRAL SMOOTH 2.0X22 (Peg) ×2 IMPLANT
PLATE SHORT 21.6X48.9 NRRW RT (Plate) ×2 IMPLANT
SCREW BN 12X3.5XNS CORT TI (Screw) IMPLANT
SCREW CORT 3.5X10 LNG (Screw) ×2 IMPLANT
SCREW CORT 3.5X12 (Screw) ×9 IMPLANT
SCREW PEG LOCK 2.5X20 (Peg) ×4 IMPLANT
SET BASIN LINEN APH (SET/KITS/TRAYS/PACK) ×3 IMPLANT
SPLINT IMMOBILIZER J 3INX20FT (CAST SUPPLIES) ×2
SPLINT J IMMOBILIZER 3X20FT (CAST SUPPLIES) ×1 IMPLANT
STAPLER VISISTAT 35W (STAPLE) ×2 IMPLANT
SUT ETHILON 3 0 FSL (SUTURE) ×2 IMPLANT
SUT MON AB 2-0 SH 27 (SUTURE) ×3
SUT MON AB 2-0 SH27 (SUTURE) ×1 IMPLANT
SYR CONTROL 10ML LL (SYRINGE) ×3 IMPLANT
WATER STERILE IRR 1000ML POUR (IV SOLUTION) ×3 IMPLANT

## 2020-03-03 NOTE — H&P (Signed)
  Chief Complaint  Patient presents with  . Wrist Injury      ER follow up right wrist fracture, DOI 02-26-20.    59 year old female with osteoporosis currently on vitamin D presents with right distal radius fracture secondary to a fall caused by her dog.  Complains of pain and swelling of the right wrist occasional tingling depending on position of the arm in the sling     Mid face erythema with telangiectasias +/- scattered inflammatory papules.       Review of Systems  Neurological: Positive for tingling.  All other systems reviewed and are negative.       Past Medical History:  Diagnosis Date  . Constipation    . GERD (gastroesophageal reflux disease)    . PONV (postoperative nausea and vomiting)    . Ulcer of esophagus           Past Surgical History:  Procedure Laterality Date  . CERVICAL CONIZATION W/BX   1983  . COLONOSCOPY   06/27/2011    Procedure: COLONOSCOPY;  Surgeon: Rogene Houston, MD;  Location: [facility];  Service: Endoscopy;  Laterality: N/A;  12:00  . ESOPHAGOGASTRODUODENOSCOPY N/A 08/10/2015    Procedure: ESOPHAGOGASTRODUODENOSCOPY (EGD);  Surgeon: Rogene Houston, MD;  Location: [facility];  Service: Endoscopy;  Laterality: N/A;  1:00         Family History  Problem Relation Age of Onset  . CVA Mother    . Esophageal varices Sister    . Hiatal hernia Sister    . Colon cancer Maternal Uncle        Social History         Tobacco Use  . Smoking status: Current Every Day Smoker      Packs/day: 1.00      Years: 30.00      Pack years: 30.00      Types: Cigarettes  . Smokeless tobacco: Never Used  . Tobacco comment: 1 pack a day  Substance Use Topics  . Alcohol use: No      Alcohol/week: 0.0 standard drinks  . Drug use: No      BP 110/64   Pulse 74   Ht 5\' 2"  (1.575 m)   Wt 122 lb (55.3 kg)   BMI 22.31 kg/m    Mood affect normal appearance normal small body habitus   Awake alert and oriented x3   Gait and station normal    Splint was removed from the right wrist swollen right hand with deformity sensation is intact color capillary refill normal tenderness over the fracture site she can move the fingers the wrist is painful to move elbow is nontender shoulder and humerus nontender   Ulnar styloid tender   X-rays from the hospital show a volar displaced fracture distal radius including ulnar styloid with dorsiflexion of the lunate but no intercarpal abnormality seen   Diagnosis   Volar displaced distal radius fracture Barton's variant   Recommend ORIF right wrist   The procedure has been fully reviewed with the patient; The risks and benefits of surgery have been discussed and explained and understood. Alternative treatment has also been reviewed, questions were encouraged and answered. The postoperative plan is also been reviewed.          Electronically signed by Carole Civil, MD at 03/01/2020 11:55 AM  Office Visit on 03/01/2020   Office Visit on 03/01/2020     Detailed Report    Note shared with patient

## 2020-03-03 NOTE — Anesthesia Postprocedure Evaluation (Signed)
Anesthesia Post Note  Patient: ABBEE CREMEENS  Procedure(s) Performed: OPEN REDUCTION INTERNAL FIXATION (ORIF) RIGHT WRIST FRACTURE (Right Wrist)  Patient location during evaluation: PACU Anesthesia Type: Regional and MAC Level of consciousness: awake and alert Pain management: pain level controlled Vital Signs Assessment: post-procedure vital signs reviewed and stable Respiratory status: spontaneous breathing Cardiovascular status: stable Postop Assessment: no apparent nausea or vomiting Anesthetic complications: no   No complications documented.   Last Vitals:  Vitals:   03/03/20 1127  BP: (!) 118/53  Pulse: 62  Temp: 36.7 C  SpO2: 97%    Last Pain:  Vitals:   03/03/20 1127  TempSrc: Oral  PainSc: 0-No pain                 Timothy Townsel Hristova

## 2020-03-03 NOTE — Anesthesia Procedure Notes (Signed)
Anesthesia Regional Block: Supraclavicular block   Pre-Anesthetic Checklist: ,, timeout performed, Correct Patient, Correct Site, Correct Laterality, Correct Procedure, Correct Position, site marked, Risks and benefits discussed, at surgeon's request and post-op pain management  Laterality: Right  Prep: chloraprep       Needles:  Injection technique: Single-shot  Needle Type: Echogenic Stimulator Needle     Needle Length: 8.3cm  Needle Gauge: 22     Additional Needles:   Procedures:,,,, ultrasound used (permanent image in chart),,,,   Nerve Stimulator or Paresthesia:  Response: Twitch elicited, 0.5 mA, 0.3 ms,   Additional Responses:   Narrative:  Start time: 03/03/2020 12:05 PM End time: 03/03/2020 12:14 PM Injection made incrementally with aspirations every 5 mL.  Performed by: Personally  Anesthesiologist: Denese Killings, MD  Additional Notes: Block assessed prior to start of surgery

## 2020-03-03 NOTE — Brief Op Note (Signed)
03/03/2020  3:00 PM  PATIENT:  Madison Walsh  59 y.o. female  PRE-OPERATIVE DIAGNOSIS:  right wrist fracture  POST-OPERATIVE DIAGNOSIS:  right wrist fracture, volar fracture dislocation  PROCEDURE:  Procedure(s): OPEN REDUCTION INTERNAL FIXATION (ORIF) RIGHT WRIST FRACTURE (Right)  Implant DVR narrow plate with multiple pegs some threaded some smooth and 3 cortical screws  Assisted by Corrie Dandy  Anesthesia supraclavicular block with IV sedation  SURGEON:  Surgeon(s) and Role:    Carole Civil, MD - Primary  EBL:  min  BLOOD ADMINISTERED:none  DRAINS: none   LOCAL MEDICATIONS USED:  NONE  SPECIMEN:  No Specimen  DISPOSITION OF SPECIMEN:  N/A  COUNTS:  YES  TOURNIQUET:   Total Tourniquet Time Documented: Upper Arm (Right) - 81 minutes Total: Upper Arm (Right) - 81 minutes   DICTATION: .Viviann Spare Dictation  PLAN OF CARE: Discharge to home after PACU  PATIENT DISPOSITION:  PACU - hemodynamically stable.   Delay start of Pharmacological VTE agent (>24hrs) due to surgical blood loss or risk of bleeding: no  Patient seen in PACU site confirmed chart reviewed plates checked and available images checked  Patient was taken to the operating room after the supraclavicular block she had a IV sedation.  She had sterile prep and drape of the right upper extremity and then radiographs were brought in.  The arm was manipulated under the C arm and found to be a fracture dislocation of the wrist  Timeout was completed limb was exsanguinated with a 4 inch Esmarch tourniquet elevated to 250 mmHg  The incision was made over the FCR tendon zigzag across the wrist and towards the first extensor compartment subcutaneous tissue was divided FCR tendon identified FCR tendon sheath split FCR tendon retracted ulnarly.  Flexor to the thumb was dissected free of the underlying space between it and the pronator quadratus which was then cut in an L-shaped based radially and retracted  ulnarly.  This exposed the fracture.  The fracture was manipulated with a bolster into extension confirmed reduction with x-ray and then the plate was applied starting with the cortical center hole.  I started the distal fixation with ulnar placed proximal threaded pegs.  However upon x-ray I was unsatisfied with the reduction although it looked anatomic in the field it looked as if the volar fragment was tilted dorsally.  So I removed all the fixation checked the reduction with direct visualization and found the fracture to be reduced.  I surmise that the plate which was precontoured was not anatomically reducing the fracture as a true buttress plate so I removed some of the contour and the plate and then reapplied it.  Even with this the dorsal fragment still appeared to be slightly dorsally tilted despite anatomic reduction at the fracture site  I decided not to do any further manipulation of the hardware as the bone was not of sufficient quality to warrant multiple manipulations and drill holes  We did get a stable reduction the patient's range of motion was full including pronation supination  After completing all of the pegs to threaded pegs proximally and 4 distal smooth pegs I completed the cortical fixation and irrigated the wound and closed it with interrupted 2-0 Monocryl and staples  Volar splint was applied  Patient tourniquet was released color came back nicely.  Patient was returned to recovery in stable condition.

## 2020-03-03 NOTE — Anesthesia Preprocedure Evaluation (Addendum)
Anesthesia Evaluation  Patient identified by MRN, date of birth, ID band Patient awake    Reviewed: Allergy & Precautions, NPO status , Patient's Chart, lab work & pertinent test results  History of Anesthesia Complications (+) PONV and history of anesthetic complications  Airway Mallampati: II  TM Distance: >3 FB Neck ROM: Full    Dental  (+) Missing, Dental Advisory Given   Pulmonary shortness of breath and with exertion, Current Smoker and Patient abstained from smoking.,    Pulmonary exam normal breath sounds clear to auscultation       Cardiovascular Exercise Tolerance: Good Normal cardiovascular exam Rhythm:Regular Rate:Normal     Neuro/Psych negative neurological ROS  negative psych ROS   GI/Hepatic PUD, GERD  Controlled,  Endo/Other    Renal/GU      Musculoskeletal Right wrist fx   Abdominal   Peds  Hematology   Anesthesia Other Findings   Reproductive/Obstetrics                           Anesthesia Physical Anesthesia Plan  ASA: III  Anesthesia Plan: General and Regional   Post-op Pain Management:  Regional for Post-op pain   Induction: Intravenous  PONV Risk Score and Plan: 3  Airway Management Planned: Nasal Cannula, Natural Airway and Simple Face Mask  Additional Equipment:   Intra-op Plan:   Post-operative Plan:   Informed Consent: I have reviewed the patients History and Physical, chart, labs and discussed the procedure including the risks, benefits and alternatives for the proposed anesthesia with the patient or authorized representative who has indicated his/her understanding and acceptance.     Dental advisory given  Plan Discussed with: CRNA and Surgeon  Anesthesia Plan Comments: (Regional and TIVA, possible GA with airway was discussed.)       Anesthesia Quick Evaluation

## 2020-03-03 NOTE — Discharge Instructions (Signed)
Monitored Anesthesia Care, Care After These instructions provide you with information about caring for yourself after your procedure. Your health care provider may also give you more specific instructions. Your treatment has been planned according to current medical practices, but problems sometimes occur. Call your health care provider if you have any problems or questions after your procedure. What can I expect after the procedure? After your procedure, you may:  Feel sleepy for several hours.  Feel clumsy and have poor balance for several hours.  Feel forgetful about what happened after the procedure.  Have poor judgment for several hours.  Feel nauseous or vomit.  Have a sore throat if you had a breathing tube during the procedure. Follow these instructions at home: For at least 24 hours after the procedure:      Have a responsible adult stay with you. It is important to have someone help care for you until you are awake and alert.  Rest as needed.  Do not: ? Participate in activities in which you could fall or become injured. ? Drive. ? Use heavy machinery. ? Drink alcohol. ? Take sleeping pills or medicines that cause drowsiness. ? Make important decisions or sign legal documents. ? Take care of children on your own. Eating and drinking  Follow the diet that is recommended by your health care provider.  If you vomit, drink water, juice, or soup when you can drink without vomiting.  Make sure you have little or no nausea before eating solid foods. General instructions  Take over-the-counter and prescription medicines only as told by your health care provider.  If you have sleep apnea, surgery and certain medicines can increase your risk for breathing problems. Follow instructions from your health care provider about wearing your sleep device: ? Anytime you are sleeping, including during daytime naps. ? While taking prescription pain medicines, sleeping medicines,  or medicines that make you drowsy.  If you smoke, do not smoke without supervision.  Keep all follow-up visits as told by your health care provider. This is important. Contact a health care provider if:  You keep feeling nauseous or you keep vomiting.  You feel light-headed.  You develop a rash.  You have a fever. Get help right away if:  You have trouble breathing. Summary  For several hours after your procedure, you may feel sleepy and have poor judgment.  Have a responsible adult stay with you for at least 24 hours or until you are awake and alert. This information is not intended to replace advice given to you by your health care provider. Make sure you discuss any questions you have with your health care provider. Document Revised: 11/24/2017 Document Reviewed: 12/17/2015 Elsevier Patient Education  [place]     Selective Nerve Root Block, Care After This sheet gives you information about how to care for yourself after your procedure. Your health care provider may also give you more specific instructions. If you have problems or questions, contact your health care provider. What can I expect after the procedure? After the procedure, it is common to feel some discomfort at the injection (puncture) site. The injection may temporarily reduce or eliminate pain in the part of the body that is supplied by the nerve. Follow these instructions at home: Activity  Do not drive or use heavy machinery while taking prescription pain medicine.  Do not drive for 24 hours if you were given a medicine to help you relax (sedative) during the procedure.  Do not lift  anything that is heavier than 10 lb (4.5 kg), or the limit that you are told, until your health care provider says that it is safe.  Return to your normal activities as directed. Ask your health care provider what activities are safe for you. Puncture site care  Remove your bandage (dressing) 24 hours after your  procedure, or as told by your health care provider.  Check your puncture site every day for signs of infection. Check for: ? Redness, swelling, or pain. ? Warmth. ? Fluid or blood. ? Pus or a bad smell. General instructions   Take over-the-counter and prescription medicines only as told by your health care provider.  Do not take showers or baths, swim, or use a hot tub until your health care provider approves. In most cases, you may shower and bathe starting 24 hours after your procedure.  If directed, put ice on the puncture area to relieve discomfort. ? Put ice in a plastic bag. ? Place a towel between your skin and the bag. ? Leave the ice on for 20 minutes, 2-3 times a day.  Keep all follow-up visits as told by your health care provider. This is important. Contact a health care provider if:  You have redness, swelling, or pain around your puncture site.  You have fluid or blood coming from your puncture site.  You have pus or a bad smell coming from your puncture site.  Your puncture site feels warm to the touch.  You have a fever. Get help right away if:  You have: ? Shortness of breath. ? A severe headache. ? Pain that gets worse.  You develop weakness or loss of feeling (numbness).  You develop muscle weakness or paralysis. Summary  Do not drive for 24 hours if you were given a sedative.  Check your puncture site every day for signs of infection.  Return to your normal activities as directed. Ask your health care provider what activities are safe for you.  Take over-the-counter and prescription medicines only as told by your health care provider. This information is not intended to replace advice given to you by your health care provider. Make sure you discuss any questions you have with your health care provider. Document Revised: 08/08/2017 Document Reviewed: 06/06/2017 Elsevier Patient Education  2020 [employer].

## 2020-03-03 NOTE — Interval H&P Note (Signed)
History and Physical Interval Note:  03/03/2020 1:06 PM  Madison Walsh  has presented today for surgery, with the diagnosis of right wrist fracture.  The various methods of treatment have been discussed with the patient and family. After consideration of risks, benefits and other options for treatment, the patient has consented to  Procedure(s): OPEN REDUCTION INTERNAL FIXATION (ORIF) DISTAL RADIAL FRACTURE (Right) as a surgical intervention.  The patient's history has been reviewed, patient examined, no change in status, stable for surgery.  I have reviewed the patient's chart and labs.  Questions were answered to the patient's satisfaction.     Arther Abbott

## 2020-03-03 NOTE — Op Note (Signed)
03/03/2020  3:00 PM  PATIENT:  Madison Walsh  59 y.o. female  PRE-OPERATIVE DIAGNOSIS:  right wrist fracture  POST-OPERATIVE DIAGNOSIS:  right wrist fracture, volar fracture dislocation  PROCEDURE:  Procedure(s): OPEN REDUCTION INTERNAL FIXATION (ORIF) RIGHT WRIST FRACTURE (Right)  Implant DVR narrow plate with multiple pegs some threaded some smooth and 3 cortical screws  Assisted by Corrie Dandy  Anesthesia supraclavicular block with IV sedation  SURGEON:  Surgeon(s) and Role:    Carole Civil, MD - Primary  EBL:  min  BLOOD ADMINISTERED:none  DRAINS: none   LOCAL MEDICATIONS USED:  NONE  SPECIMEN:  No Specimen  DISPOSITION OF SPECIMEN:  N/A  COUNTS:  YES  TOURNIQUET:   Total Tourniquet Time Documented: Upper Arm (Right) - 81 minutes Total: Upper Arm (Right) - 81 minutes   DICTATION: .Viviann Spare Dictation  PLAN OF CARE: Discharge to home after PACU  PATIENT DISPOSITION:  PACU - hemodynamically stable.   Delay start of Pharmacological VTE agent (>24hrs) due to surgical blood loss or risk of bleeding: no  Patient seen in PACU site confirmed chart reviewed plates checked and available images checked  Patient was taken to the operating room after the supraclavicular block she had a IV sedation.  She had sterile prep and drape of the right upper extremity and then radiographs were brought in.  The arm was manipulated under the C arm and found to be a fracture dislocation of the wrist  Timeout was completed limb was exsanguinated with a 4 inch Esmarch tourniquet elevated to 250 mmHg  The incision was made over the FCR tendon zigzag across the wrist and towards the first extensor compartment subcutaneous tissue was divided FCR tendon identified FCR tendon sheath split FCR tendon retracted ulnarly.  Flexor to the thumb was dissected free of the underlying space between it and the pronator quadratus which was then cut in an L-shaped based radially and retracted  ulnarly.  This exposed the fracture.  The fracture was manipulated with a bolster into extension confirmed reduction with x-ray and then the plate was applied starting with the cortical center hole.  I started the distal fixation with ulnar placed proximal threaded pegs.  However upon x-ray I was unsatisfied with the reduction although it looked anatomic in the field it looked as if the volar fragment was tilted dorsally.  So I removed all the fixation checked the reduction with direct visualization and found the fracture to be reduced.  I surmise that the plate which was precontoured was not anatomically reducing the fracture as a true buttress plate so I removed some of the contour and the plate and then reapplied it.  Even with this the dorsal fragment still appeared to be slightly dorsally tilted despite anatomic reduction at the fracture site  I decided not to do any further manipulation of the hardware as the bone was not of sufficient quality to warrant multiple manipulations and drill holes  We did get a stable reduction the patient's range of motion was full including pronation supination  After completing all of the pegs to threaded pegs proximally and 4 distal smooth pegs I completed the cortical fixation and irrigated the wound and closed it with interrupted 2-0 Monocryl and staples  Volar splint was applied  Patient tourniquet was released color came back nicely.  Patient was returned to recovery in stable condition.

## 2020-03-03 NOTE — Transfer of Care (Signed)
Immediate Anesthesia Transfer of Care Note  Patient: Madison Walsh  Procedure(s) Performed: OPEN REDUCTION INTERNAL FIXATION (ORIF) RIGHT WRIST FRACTURE (Right Wrist)  Patient Location: PACU  Anesthesia Type:MAC and Regional  Level of Consciousness: awake, alert  and oriented  Airway & Oxygen Therapy: Patient Spontanous Breathing  Post-op Assessment: Report given to RN, Post -op Vital signs reviewed and stable and Patient moving all extremities  Post vital signs: Reviewed and stable  Last Vitals:  Vitals Value Taken Time  BP    Temp    Pulse 83 03/03/20 1506  Resp 17 03/03/20 1506  SpO2 94 % 03/03/20 1506  Vitals shown include unvalidated device data.  Last Pain:  Vitals:   03/03/20 1127  TempSrc: Oral  PainSc: 0-No pain      Patients Stated Pain Goal: 3 (58/83/25 [id number])  Complications: No complications documented.

## 2020-03-03 NOTE — OR Nursing (Signed)
Time out done, at 1205. patient had right arm shoulder block done by Dr. Wyatt Haste.  Tolerated very well.  Ultasound pic placed on chart.

## 2020-03-04 NOTE — Addendum Note (Signed)
Addendum  created 03/04/20 0945 by Denese Killings, MD   Charge Capture section accepted

## 2020-03-06 ENCOUNTER — Encounter (HOSPITAL_COMMUNITY): Payer: Self-pay | Admitting: Orthopedic Surgery

## 2020-03-07 DIAGNOSIS — Z8781 Personal history of (healed) traumatic fracture: Secondary | ICD-10-CM | POA: Insufficient documentation

## 2020-03-09 ENCOUNTER — Other Ambulatory Visit: Payer: Self-pay

## 2020-03-09 ENCOUNTER — Encounter: Payer: Self-pay | Admitting: Orthopedic Surgery

## 2020-03-09 ENCOUNTER — Ambulatory Visit: Payer: 59 | Admitting: Orthopedic Surgery

## 2020-03-09 ENCOUNTER — Ambulatory Visit (INDEPENDENT_AMBULATORY_CARE_PROVIDER_SITE_OTHER): Payer: 59 | Admitting: Orthopedic Surgery

## 2020-03-09 DIAGNOSIS — S52561D Barton's fracture of right radius, subsequent encounter for closed fracture with routine healing: Secondary | ICD-10-CM

## 2020-03-09 MED ORDER — HYDROCODONE-ACETAMINOPHEN 5-325 MG PO TABS: 1.0000 | ORAL_TABLET | ORAL | 0 refills | Status: AC | PRN

## 2020-03-09 NOTE — Progress Notes (Signed)
Chief Complaint  Patient presents with  . Routine Post Op    ORIF wrist right 03/03/20   POD 6  Dressing change wound looks fine fingers are little stiff I showed her how to exercise those to get them all the way down all the way out  velcro splint applied  arom exercises   / Meds ordered this encounter  Medications  . HYDROcodone-acetaminophen (NORCO/VICODIN) 5-325 MG tablet    Sig: Take 1 tablet by mouth every 4 (four) hours as needed for up to 7 days.    Dispense:  42 tablet    Refill:  0    1 week staples out + xrays

## 2020-03-17 ENCOUNTER — Ambulatory Visit: Payer: 59

## 2020-03-17 ENCOUNTER — Other Ambulatory Visit: Payer: Self-pay

## 2020-03-17 ENCOUNTER — Encounter: Payer: Self-pay | Admitting: Orthopedic Surgery

## 2020-03-17 ENCOUNTER — Ambulatory Visit (INDEPENDENT_AMBULATORY_CARE_PROVIDER_SITE_OTHER): Payer: 59 | Admitting: Orthopedic Surgery

## 2020-03-17 DIAGNOSIS — S52561D Barton's fracture of right radius, subsequent encounter for closed fracture with routine healing: Secondary | ICD-10-CM

## 2020-03-17 DIAGNOSIS — Z9889 Other specified postprocedural states: Secondary | ICD-10-CM

## 2020-03-17 DIAGNOSIS — Z8781 Personal history of (healed) traumatic fracture: Secondary | ICD-10-CM

## 2020-03-17 MED ORDER — HYDROCODONE-ACETAMINOPHEN 5-325 MG PO TABS: 1.0000 | ORAL_TABLET | Freq: Four times a day (QID) | ORAL | 0 refills | Status: AC | PRN

## 2020-03-17 NOTE — Progress Notes (Signed)
Chief Complaint  Patient presents with  . Routine Post Op    ORIF wrist right 03/03/20   POD 14   Status post open reduction internal fixation right wrist with volar plating for a closed Barton's fracture dislocation  Active range of motion is limited but passive range of motion is normal patient instructed on exercises to ensure range of motion returns  X-rays show the fracture is in stable position  Wound looks clean staples were removed  Removable splint applied  X-ray in 4 weeks  Meds ordered this encounter  Medications  . HYDROcodone-acetaminophen (NORCO/VICODIN) 5-325 MG tablet    Sig: Take 1 tablet by mouth every 6 (six) hours as needed for up to 7 days for moderate pain.    Dispense:  28 tablet    Refill:  0

## 2020-03-17 NOTE — Patient Instructions (Signed)
Do the exercises 3 times  A day   Wear brace 4 weeks

## 2020-03-24 ENCOUNTER — Telehealth: Payer: Self-pay | Admitting: Orthopedic Surgery

## 2020-03-24 NOTE — Telephone Encounter (Signed)
Patient advised numbness is normal and it is ok to apply lotion and scar cream

## 2020-03-24 NOTE — Telephone Encounter (Signed)
Patient called to ask if she may start to apply any topical cream to her wrist and arm (status/post ORIF right wrist done on 03/03/20); states skin is dry. Also states she is having some numbness around the surgical site; asking if this is normal. Please advise.

## 2020-04-17 ENCOUNTER — Telehealth (HOSPITAL_COMMUNITY): Payer: Self-pay

## 2020-04-17 ENCOUNTER — Ambulatory Visit: Payer: 59

## 2020-04-17 ENCOUNTER — Other Ambulatory Visit: Payer: Self-pay

## 2020-04-17 ENCOUNTER — Ambulatory Visit (INDEPENDENT_AMBULATORY_CARE_PROVIDER_SITE_OTHER): Payer: 59 | Admitting: Orthopedic Surgery

## 2020-04-17 ENCOUNTER — Encounter: Payer: Self-pay | Admitting: Orthopedic Surgery

## 2020-04-17 ENCOUNTER — Telehealth: Payer: Self-pay | Admitting: Orthopedic Surgery

## 2020-04-17 DIAGNOSIS — S52561D Barton's fracture of right radius, subsequent encounter for closed fracture with routine healing: Secondary | ICD-10-CM

## 2020-04-17 DIAGNOSIS — M25641 Stiffness of right hand, not elsewhere classified: Secondary | ICD-10-CM

## 2020-04-17 NOTE — Telephone Encounter (Signed)
No it needs to be after surgery I called patient then to OP therapy Izora Gala They have RS to 05/10/20 at 10.30 for splint to be made I called patient back to advise.

## 2020-04-17 NOTE — Progress Notes (Signed)
Chief Complaint  Patient presents with  . Follow-up    Recheck on right wrsit fracture, DOS 03-03-20.   6 weeks postop fracture dislocation right wrist with a volar Barton's injury treated with a plate patient has severe stiffness in the fingers cannot make a full fist passive or active  Extension is normal.  She also complains of some tenderness over the distal portion of the incision may be a retained suture  She also complains that is warm all the time although she has no erythema warmth or swelling today  X-ray shows a fracture is healing in good position with good position of the hardware  Recommend lysis of adhesions right wrist  Patient will need extended time out of work  She is going on vacation so I told her to wait until she comes back to have the surgery

## 2020-04-17 NOTE — Telephone Encounter (Signed)
Dr. Ruthe Mannan nurse Amy called to request splint be made after surgery -patient's hand is too stiff to make the splint now.) Amy will call patient and let her know of the new apptment date and time

## 2020-04-17 NOTE — Telephone Encounter (Signed)
Patient called to verify that she is to begin occupational therapy before her surgery. Appointment is scheduled for therapy per chart notes.

## 2020-04-17 NOTE — Patient Instructions (Signed)
Surgery planned for 31 august

## 2020-04-17 NOTE — Addendum Note (Signed)
Addended byCandice Camp on: 04/17/2020 01:26 PM   Modules accepted: Orders, SmartSet

## 2020-04-19 ENCOUNTER — Ambulatory Visit (HOSPITAL_COMMUNITY): Payer: 59

## 2020-04-26 ENCOUNTER — Telehealth: Payer: Self-pay | Admitting: Orthopedic Surgery

## 2020-04-26 NOTE — Telephone Encounter (Signed)
Having surgery ? Yes  Tingling in the hand should be able to improve with surgeryt to release scarring around the nerve   Skin changes may be due to RSD

## 2020-04-26 NOTE — Telephone Encounter (Signed)
Madison Walsh called and said she has some questions.  She asks that you give her a call when you have time  Thanks so much

## 2020-04-26 NOTE — Telephone Encounter (Signed)
Can you call to discuss

## 2020-04-26 NOTE — Telephone Encounter (Signed)
She has complaints of skin splotching (not itching) also has times when hand feels really hot or really cold  Also has problems with tingling in hand

## 2020-04-27 NOTE — Telephone Encounter (Signed)
Come in tomorrow

## 2020-04-28 ENCOUNTER — Other Ambulatory Visit: Payer: Self-pay

## 2020-04-28 ENCOUNTER — Encounter: Payer: Self-pay | Admitting: Orthopedic Surgery

## 2020-04-28 ENCOUNTER — Ambulatory Visit (INDEPENDENT_AMBULATORY_CARE_PROVIDER_SITE_OTHER): Payer: 59 | Admitting: Orthopedic Surgery

## 2020-04-28 DIAGNOSIS — G90511 Complex regional pain syndrome I of right upper limb: Secondary | ICD-10-CM

## 2020-04-28 DIAGNOSIS — G90521 Complex regional pain syndrome I of right lower limb: Secondary | ICD-10-CM | POA: Insufficient documentation

## 2020-04-28 MED ORDER — CLONIDINE HCL 0.1 MG PO TABS: 0.1000 mg | ORAL_TABLET | Freq: Once | ORAL | 11 refills | Status: DC

## 2020-04-28 MED ORDER — GABAPENTIN 100 MG PO CAPS: 100.0000 mg | ORAL_CAPSULE | Freq: Three times a day (TID) | ORAL | 2 refills | Status: DC

## 2020-04-28 MED ORDER — CLONIDINE HCL 0.1 MG PO TABS: 0.1000 mg | ORAL_TABLET | Freq: Every day | ORAL | 0 refills | Status: DC

## 2020-04-28 NOTE — H&P (View-Only) (Signed)
POST OP   Chief Complaint  Patient presents with  . Post-op Problem    has spotches and increased pain hand/ wrist Right     S/p fx dislocation right wrist  Pod 56 s/o orif volar plate  We brought Madison Walsh in today she was having some issues with her wrist in terms of his color and skin discoloration.  She also had intermittent swelling intermittent temperature changes  She is got very stiff fingers and hand and wrist status post volar plating  Seems to be developing complex regional pain syndrome which we reviewed today   started on vitamin C 500 mg a day gabapentin 100 mg 3 times a day and clonidine 0.1 mg daily until she comes back  Meds ordered this encounter  Medications  . gabapentin (NEURONTIN) 100 MG capsule    Sig: Take 1 capsule (100 mg total) by mouth 3 (three) times daily.    Dispense:  90 capsule    Refill:  2  . DISCONTD: cloNIDine (CATAPRES) 0.1 MG tablet    Sig: Take 1 tablet (0.1 mg total) by mouth once for 1 dose.    Dispense:  60 tablet    Refill:  11  . cloNIDine (CATAPRES) 0.1 MG tablet    Sig: Take 1 tablet (0.1 mg total) by mouth daily.    Dispense:  30 tablet    Refill:  0

## 2020-04-28 NOTE — Progress Notes (Signed)
POST OP   Chief Complaint  Patient presents with  . Post-op Problem    has spotches and increased pain hand/ wrist Right     S/p fx dislocation right wrist  Pod 56 s/o orif volar plate  We brought Madison Walsh in today she was having some issues with her wrist in terms of his color and skin discoloration.  She also had intermittent swelling intermittent temperature changes  She is got very stiff fingers and hand and wrist status post volar plating  Seems to be developing complex regional pain syndrome which we reviewed today   started on vitamin C 500 mg a day gabapentin 100 mg 3 times a day and clonidine 0.1 mg daily until she comes back  Meds ordered this encounter  Medications  . gabapentin (NEURONTIN) 100 MG capsule    Sig: Take 1 capsule (100 mg total) by mouth 3 (three) times daily.    Dispense:  90 capsule    Refill:  2  . DISCONTD: cloNIDine (CATAPRES) 0.1 MG tablet    Sig: Take 1 tablet (0.1 mg total) by mouth once for 1 dose.    Dispense:  60 tablet    Refill:  11  . cloNIDine (CATAPRES) 0.1 MG tablet    Sig: Take 1 tablet (0.1 mg total) by mouth daily.    Dispense:  30 tablet    Refill:  0

## 2020-04-28 NOTE — Patient Instructions (Addendum)
Keep appts as is   Vitamin C 500 mg per day  Take the Gabapentin and Clonidine for the nerve pains and tingling

## 2020-05-04 ENCOUNTER — Telehealth: Payer: Self-pay | Admitting: Orthopedic Surgery

## 2020-05-04 NOTE — Telephone Encounter (Signed)
Patient called to see what time her surgery was scheduled for next week. I advised her that she would need to speak with the hospital scheduling to confirm her appointment. She did not have any other questions.

## 2020-05-08 ENCOUNTER — Encounter (HOSPITAL_COMMUNITY)
Admission: RE | Admit: 2020-05-08 | Discharge: 2020-05-08 | Disposition: A | Discharge status: Home / Self Care | Payer: 59 | Source: Ambulatory Visit | Attending: Orthopedic Surgery | Admitting: Orthopedic Surgery

## 2020-05-08 ENCOUNTER — Other Ambulatory Visit: Payer: Self-pay

## 2020-05-08 ENCOUNTER — Encounter (HOSPITAL_COMMUNITY): Payer: Self-pay

## 2020-05-08 ENCOUNTER — Other Ambulatory Visit (HOSPITAL_COMMUNITY)
Admission: RE | Admit: 2020-05-08 | Discharge: 2020-05-08 | Disposition: A | Discharge status: Home / Self Care | Payer: 59 | Source: Ambulatory Visit | Attending: Orthopedic Surgery | Admitting: Orthopedic Surgery

## 2020-05-08 DIAGNOSIS — Z01810 Encounter for preprocedural cardiovascular examination: Secondary | ICD-10-CM | POA: Insufficient documentation

## 2020-05-08 DIAGNOSIS — Z20822 Contact with and (suspected) exposure to covid-19: Secondary | ICD-10-CM | POA: Diagnosis not present

## 2020-05-08 DIAGNOSIS — Z01812 Encounter for preprocedural laboratory examination: Secondary | ICD-10-CM | POA: Diagnosis not present

## 2020-05-08 LAB — CBC WITH DIFFERENTIAL/PLATELET
Abs Immature Granulocytes: 0.06 K/uL (ref 0.00–0.07)
Basophils Absolute: 0.1 K/uL (ref 0.0–0.1)
Basophils Relative: 1 %
Eosinophils Absolute: 0 K/uL (ref 0.0–0.5)
Eosinophils Relative: 0 %
HCT: 43.2 % (ref 36.0–46.0)
Hemoglobin: 14.1 g/dL (ref 12.0–15.0)
Immature Granulocytes: 0 %
Lymphocytes Relative: 17 %
Lymphs Abs: 2.4 K/uL (ref 0.7–4.0)
MCH: 31.5 pg (ref 26.0–34.0)
MCHC: 32.6 g/dL (ref 30.0–36.0)
MCV: 96.6 fL (ref 80.0–100.0)
Monocytes Absolute: 0.8 K/uL (ref 0.1–1.0)
Monocytes Relative: 6 %
Neutro Abs: 10.6 K/uL — ABNORMAL HIGH (ref 1.7–7.7)
Neutrophils Relative %: 76 %
Platelets: 307 K/uL (ref 150–400)
RBC: 4.47 MIL/uL (ref 3.87–5.11)
RDW: 14.3 % (ref 11.5–15.5)
WBC: 14 K/uL — ABNORMAL HIGH (ref 4.0–10.5)
nRBC: 0 % (ref 0.0–0.2)

## 2020-05-08 LAB — BASIC METABOLIC PANEL WITH GFR
Anion gap: 8 (ref 5–15)
BUN: 12 mg/dL (ref 6–20)
CO2: 25 mmol/L (ref 22–32)
Calcium: 9.2 mg/dL (ref 8.9–10.3)
Chloride: 106 mmol/L (ref 98–111)
Creatinine, Ser: 0.62 mg/dL (ref 0.44–1.00)
GFR calc Af Amer: >60 mL/min
GFR calc non Af Amer: >60 mL/min
Glucose, Bld: 80 mg/dL (ref 70–99)
Potassium: 3.8 mmol/L (ref 3.5–5.1)
Sodium: 139 mmol/L (ref 135–145)

## 2020-05-08 LAB — SARS CORONAVIRUS 2 (TAT 6-24 HRS): SARS Coronavirus 2: NEGATIVE

## 2020-05-08 NOTE — Patient Instructions (Signed)
Your procedure is scheduled on: 05/09/2020  Report to [facility] at    6:15 AM.  Call this number if you have problems the morning of surgery: [phone]   Remember:   Do not Eat or Drink after midnight         No Smoking the morning of surgery  :  Take these medicines the morning of surgery with A SIP OF WATER: Clonidine   Do not wear jewelry, make-up or nail polish.  Do not wear lotions, powders, or perfumes. You may wear deodorant.  Do not shave 48 hours prior to surgery. Men may shave face and neck.  Do not bring valuables to the hospital.  Contacts, dentures or bridgework may not be worn into surgery.  Leave suitcase in the car. After surgery it may be brought to your room.  For patients admitted to the hospital, checkout time is 11:00 AM the day of discharge.   Patients discharged the day of surgery will not be allowed to drive home.    Special Instructions: Shower using CHG night before surgery and shower the day of surgery use CHG.  Use special wash - you have one bottle of CHG for all showers.  You should use approximately 1/2 of the bottle for each shower.   Wound Care, Adult Taking care of your wound properly can help to prevent pain, infection, and scarring. It can also help your wound to heal more quickly. How to care for your wound Wound care      Follow instructions from your health care provider about how to take care of your wound. Make sure you: ? Wash your hands with soap and water before you change the bandage (dressing). If soap and water are not available, use hand sanitizer. ? Change your dressing as told by your health care provider. ? Leave stitches (sutures), skin glue, or adhesive strips in place. These skin closures may need to stay in place for 2 weeks or longer. If adhesive strip edges start to loosen and curl up, you may trim the loose edges. Do not remove adhesive strips completely unless your health care provider tells you to do that.  Check  your wound area every day for signs of infection. Check for: ? Redness, swelling, or pain. ? Fluid or blood. ? Warmth. ? Pus or a bad smell.  Ask your health care provider if you should clean the wound with mild soap and water. Doing this may include: ? Using a clean towel to pat the wound dry after cleaning it. Do not rub or scrub the wound. ? Applying a cream or ointment. Do this only as told by your health care provider. ? Covering the incision with a clean dressing.  Ask your health care provider when you can leave the wound uncovered.  Keep the dressing dry until your health care provider says it can be removed. Do not take baths, swim, use a hot tub, or do anything that would put the wound underwater until your health care provider approves. Ask your health care provider if you can take showers. You may only be allowed to take sponge baths. Medicines   If you were prescribed an antibiotic medicine, cream, or ointment, take or use the antibiotic as told by your health care provider. Do not stop taking or using the antibiotic even if your condition improves.  Take over-the-counter and prescription medicines only as told by your health care provider. If you were prescribed pain medicine, take it 30  or more minutes before you do any wound care or as told by your health care provider. General instructions  Return to your normal activities as told by your health care provider. Ask your health care provider what activities are safe.  Do not scratch or pick at the wound.  Do not use any products that contain nicotine or tobacco, such as cigarettes and e-cigarettes. These may delay wound healing. If you need help quitting, ask your health care provider.  Keep all follow-up visits as told by your health care provider. This is important.  Eat a diet that includes protein, vitamin A, vitamin C, and other nutrient-rich foods to help the wound heal. ? Foods rich in protein include meat, dairy,  beans, nuts, and other sources. ? Foods rich in vitamin A include carrots and dark green, leafy vegetables. ? Foods rich in vitamin C include citrus, tomatoes, and other fruits and vegetables. ? Nutrient-rich foods have protein, carbohydrates, fat, vitamins, or minerals. Eat a variety of healthy foods including vegetables, fruits, and whole grains. Contact a health care provider if:  You received a tetanus shot and you have swelling, severe pain, redness, or bleeding at the injection site.  Your pain is not controlled with medicine.  You have redness, swelling, or pain around the wound.  You have fluid or blood coming from the wound.  Your wound feels warm to the touch.  You have pus or a bad smell coming from the wound.  You have a fever or chills.  You are nauseous or you vomit.  You are dizzy. Get help right away if:  You have a red streak going away from your wound.  The edges of the wound open up and separate.  Your wound is bleeding, and the bleeding does not stop with gentle pressure.  You have a rash.  You faint.  You have trouble breathing. Summary  Always wash your hands with soap and water before changing your bandage (dressing).  To help with healing, eat foods that are rich in protein, vitamin A, vitamin C, and other nutrients.  Check your wound every day for signs of infection. Contact your health care provider if you suspect that your wound is infected. This information is not intended to replace advice given to you by your health care provider. Make sure you discuss any questions you have with your health care provider. Document Revised: 12/14/2018 Document Reviewed: 03/12/2016 Elsevier Patient Education  [place] Anesthesia, Adult, Care After This sheet gives you information about how to care for yourself after your procedure. Your health care provider may also give you more specific instructions. If you have problems or questions,  contact your health care provider. What can I expect after the procedure? After the procedure, the following side effects are common:  Pain or discomfort at the IV site.  Nausea.  Vomiting.  Sore throat.  Trouble concentrating.  Feeling cold or chills.  Weak or tired.  Sleepiness and fatigue.  Soreness and body aches. These side effects can affect parts of the body that were not involved in surgery. Follow these instructions at home:  For at least 24 hours after the procedure:  Have a responsible adult stay with you. It is important to have someone help care for you until you are awake and alert.  Rest as needed.  Do not: ? Participate in activities in which you could fall or become injured. ? Drive. ? Use heavy machinery. ? Drink alcohol. ? Take  sleeping pills or medicines that cause drowsiness. ? Make important decisions or sign legal documents. ? Take care of children on your own. Eating and drinking  Follow any instructions from your health care provider about eating or drinking restrictions.  When you feel hungry, start by eating small amounts of foods that are soft and easy to digest (bland), such as toast. Gradually return to your regular diet.  Drink enough fluid to keep your urine pale yellow.  If you vomit, rehydrate by drinking water, juice, or clear broth. General instructions  If you have sleep apnea, surgery and certain medicines can increase your risk for breathing problems. Follow instructions from your health care provider about wearing your sleep device: ? Anytime you are sleeping, including during daytime naps. ? While taking prescription pain medicines, sleeping medicines, or medicines that make you drowsy.  Return to your normal activities as told by your health care provider. Ask your health care provider what activities are safe for you.  Take over-the-counter and prescription medicines only as told by your health care provider.  If you  smoke, do not smoke without supervision.  Keep all follow-up visits as told by your health care provider. This is important. Contact a health care provider if:  You have nausea or vomiting that does not get better with medicine.  You cannot eat or drink without vomiting.  You have pain that does not get better with medicine.  You are unable to pass urine.  You develop a skin rash.  You have a fever.  You have redness around your IV site that gets worse. Get help right away if:  You have difficulty breathing.  You have chest pain.  You have blood in your urine or stool, or you vomit blood. Summary  After the procedure, it is common to have a sore throat or nausea. It is also common to feel tired.  Have a responsible adult stay with you for the first 24 hours after general anesthesia. It is important to have someone help care for you until you are awake and alert.  When you feel hungry, start by eating small amounts of foods that are soft and easy to digest (bland), such as toast. Gradually return to your regular diet.  Drink enough fluid to keep your urine pale yellow.  Return to your normal activities as told by your health care provider. Ask your health care provider what activities are safe for you. This information is not intended to replace advice given to you by your health care provider. Make sure you discuss any questions you have with your health care provider. Document Revised: 08/29/2017 Document Reviewed: 04/11/2017 Elsevier Patient Education  [place].

## 2020-05-09 ENCOUNTER — Ambulatory Visit (HOSPITAL_COMMUNITY): Payer: 59 | Admitting: Anesthesiology

## 2020-05-09 ENCOUNTER — Ambulatory Visit (HOSPITAL_COMMUNITY)
Admission: RE | Admit: 2020-05-09 | Discharge: 2020-05-09 | Disposition: A | Discharge status: Home / Self Care | Payer: 59 | Attending: Orthopedic Surgery | Admitting: Orthopedic Surgery

## 2020-05-09 ENCOUNTER — Encounter (HOSPITAL_COMMUNITY): Payer: Self-pay | Admitting: Orthopedic Surgery

## 2020-05-09 ENCOUNTER — Encounter (HOSPITAL_COMMUNITY)
Admission: RE | Disposition: A | Discharge status: Home / Self Care | Payer: Self-pay | Source: Home / Self Care | Attending: Orthopedic Surgery

## 2020-05-09 DIAGNOSIS — K219 Gastro-esophageal reflux disease without esophagitis: Secondary | ICD-10-CM | POA: Diagnosis not present

## 2020-05-09 DIAGNOSIS — Z888 Allergy status to other drugs, medicaments and biological substances status: Secondary | ICD-10-CM | POA: Insufficient documentation

## 2020-05-09 DIAGNOSIS — M24531 Contracture, right wrist: Secondary | ICD-10-CM

## 2020-05-09 DIAGNOSIS — F17200 Nicotine dependence, unspecified, uncomplicated: Secondary | ICD-10-CM | POA: Insufficient documentation

## 2020-05-09 DIAGNOSIS — L905 Scar conditions and fibrosis of skin: Secondary | ICD-10-CM | POA: Diagnosis present

## 2020-05-09 DIAGNOSIS — Z9889 Other specified postprocedural states: Secondary | ICD-10-CM | POA: Insufficient documentation

## 2020-05-09 DIAGNOSIS — Z79899 Other long term (current) drug therapy: Secondary | ICD-10-CM | POA: Insufficient documentation

## 2020-05-09 HISTORY — PX: FLEXOR TENOTOMY: SHX6342

## 2020-05-09 SURGERY — TENOTOMY, FLEXOR
Anesthesia: Regional | Laterality: Right

## 2020-05-09 MED ORDER — EPHEDRINE 5 MG/ML INJ
INTRAVENOUS | Status: AC
  Filled 2020-05-09: qty 10

## 2020-05-09 MED ORDER — CEFAZOLIN SODIUM-DEXTROSE 2-4 GM/100ML-% IV SOLN
2.0000 g | INTRAVENOUS | Status: DC
  Filled 2020-05-09: qty 100

## 2020-05-09 MED ORDER — FENTANYL CITRATE (PF) 100 MCG/2ML IJ SOLN
INTRAMUSCULAR | Status: AC
  Filled 2020-05-09: qty 2

## 2020-05-09 MED ORDER — BUPIVACAINE HCL (PF) 0.5 % IJ SOLN
INTRAMUSCULAR | Status: AC
  Filled 2020-05-09: qty 30

## 2020-05-09 MED ORDER — ROPIVACAINE HCL 5 MG/ML IJ SOLN
INTRAMUSCULAR | Status: AC
  Filled 2020-05-09: qty 30

## 2020-05-09 MED ORDER — HYDROCODONE-ACETAMINOPHEN 5-325 MG PO TABS
1.0000 | ORAL_TABLET | ORAL | 0 refills | Status: AC | PRN
Start: 2020-05-09 — End: 2020-05-16

## 2020-05-09 MED ORDER — LIDOCAINE 2% (20 MG/ML) 5 ML SYRINGE
INTRAMUSCULAR | Status: AC
  Filled 2020-05-09: qty 10

## 2020-05-09 MED ORDER — LACTATED RINGERS IV SOLN: Freq: Once | INTRAVENOUS | Status: AC

## 2020-05-09 MED ORDER — LACTATED RINGERS IV SOLN: INTRAVENOUS | Status: DC | PRN

## 2020-05-09 MED ORDER — HYDROMORPHONE HCL 1 MG/ML IJ SOLN: 0.2500 mg | INTRAMUSCULAR | Status: DC | PRN

## 2020-05-09 MED ORDER — PROMETHAZINE HCL 25 MG/ML IJ SOLN: 6.2500 mg | INTRAMUSCULAR | Status: DC | PRN

## 2020-05-09 MED ORDER — LIDOCAINE HCL (CARDIAC) PF 50 MG/5ML IV SOSY
PREFILLED_SYRINGE | INTRAVENOUS | Status: DC | PRN
  Administered 2020-05-09: 40 mg via INTRAVENOUS

## 2020-05-09 MED ORDER — MEPERIDINE HCL 50 MG/ML IJ SOLN: 6.2500 mg | INTRAMUSCULAR | Status: DC | PRN

## 2020-05-09 MED ORDER — DEXAMETHASONE SODIUM PHOSPHATE 4 MG/ML IJ SOLN
INTRAMUSCULAR | Status: AC
  Filled 2020-05-09: qty 2

## 2020-05-09 MED ORDER — CHLORHEXIDINE GLUCONATE 0.12 % MT SOLN
15.0000 mL | Freq: Once | OROMUCOSAL | Status: AC
  Administered 2020-05-09: 15 mL via OROMUCOSAL
  Filled 2020-05-09: qty 15

## 2020-05-09 MED ORDER — ORAL CARE MOUTH RINSE: 15.0000 mL | Freq: Once | OROMUCOSAL | Status: AC

## 2020-05-09 MED ORDER — PROPOFOL 10 MG/ML IV BOLUS
INTRAVENOUS | Status: AC
  Filled 2020-05-09: qty 20

## 2020-05-09 MED ORDER — PROPOFOL 10 MG/ML IV BOLUS
INTRAVENOUS | Status: DC | PRN
  Administered 2020-05-09: 100 mg via INTRAVENOUS

## 2020-05-09 MED ORDER — 0.9 % SODIUM CHLORIDE (POUR BTL) OPTIME
TOPICAL | Status: DC | PRN
  Administered 2020-05-09: 1000 mL

## 2020-05-09 MED ORDER — ONDANSETRON HCL 4 MG/2ML IJ SOLN
INTRAMUSCULAR | Status: DC | PRN
  Administered 2020-05-09: 4 mg via INTRAVENOUS

## 2020-05-09 MED ORDER — MIDAZOLAM HCL 2 MG/2ML IJ SOLN
2.0000 mg | Freq: Once | INTRAMUSCULAR | Status: AC
  Administered 2020-05-09: 2 mg via INTRAVENOUS
  Filled 2020-05-09: qty 2

## 2020-05-09 MED ORDER — LIDOCAINE HCL (PF) 1 % IJ SOLN
INTRAMUSCULAR | Status: AC
  Filled 2020-05-09: qty 30

## 2020-05-09 SURGICAL SUPPLY — 58 items
APL PRP STRL LF DISP 70% ISPRP (MISCELLANEOUS) ×1
BANDAGE ESMARK 4X12 BL STRL LF (DISPOSABLE) ×1 IMPLANT
BANDAGE GAUZE ELAST BULKY 4 IN (GAUZE/BANDAGES/DRESSINGS) ×2 IMPLANT
BLADE SURG 15 STRL LF DISP TIS (BLADE) ×1 IMPLANT
BLADE SURG 15 STRL SS (BLADE) ×3
BNDG CMPR 12X4 ELC STRL LF (DISPOSABLE) ×1
BNDG CMPR STD VLCR NS LF 5.8X3 (GAUZE/BANDAGES/DRESSINGS) ×1
BNDG COHESIVE 4X5 TAN STRL (GAUZE/BANDAGES/DRESSINGS) ×3 IMPLANT
BNDG ELASTIC 3X5.8 VLCR NS LF (GAUZE/BANDAGES/DRESSINGS) ×3 IMPLANT
BNDG ELASTIC 3X5.8 VLCR STR LF (GAUZE/BANDAGES/DRESSINGS) ×2 IMPLANT
BNDG ESMARK 4X12 BLUE STRL LF (DISPOSABLE) ×3
BNDG GAUZE ELAST 4 BULKY (GAUZE/BANDAGES/DRESSINGS) ×2 IMPLANT
CHLORAPREP W/TINT 26 (MISCELLANEOUS) ×3 IMPLANT
CLOTH BEACON ORANGE TIMEOUT ST (SAFETY) ×3 IMPLANT
COVER LIGHT HANDLE STERIS (MISCELLANEOUS) ×6 IMPLANT
COVER WAND RF STERILE (DRAPES) ×3 IMPLANT
CUFF TOURN SGL QUICK 18X4 (TOURNIQUET CUFF) ×3 IMPLANT
DECANTER SPIKE VIAL GLASS SM (MISCELLANEOUS) ×3 IMPLANT
DRAPE HALF SHEET 40X57 (DRAPES) ×3 IMPLANT
DRSG XEROFORM 1X8 (GAUZE/BANDAGES/DRESSINGS) ×3 IMPLANT
ELECT NDL TIP 2.8 STRL (NEEDLE) ×1 IMPLANT
ELECT NEEDLE TIP 2.8 STRL (NEEDLE) ×3 IMPLANT
ELECT REM PT RETURN 9FT ADLT (ELECTROSURGICAL) ×3
ELECTRODE REM PT RTRN 9FT ADLT (ELECTROSURGICAL) ×1 IMPLANT
GAUZE SPONGE 4X4 12PLY STRL (GAUZE/BANDAGES/DRESSINGS) ×3 IMPLANT
GAUZE XEROFORM 1X8 LF (GAUZE/BANDAGES/DRESSINGS) ×2 IMPLANT
GLOVE BIOGEL PI IND STRL 7.0 (GLOVE) ×2 IMPLANT
GLOVE BIOGEL PI IND STRL 7.5 (GLOVE) IMPLANT
GLOVE BIOGEL PI INDICATOR 7.0 (GLOVE) ×10
GLOVE BIOGEL PI INDICATOR 7.5 (GLOVE) ×2
GLOVE SKINSENSE NS SZ8.0 LF (GLOVE) ×2
GLOVE SKINSENSE STRL SZ8.0 LF (GLOVE) ×1 IMPLANT
GLOVE SS BIOGEL STRL SZ 7 (GLOVE) IMPLANT
GLOVE SS BIOGEL STRL SZ 7.5 (GLOVE) IMPLANT
GLOVE SS BIOGEL STRL SZ 8 (GLOVE) IMPLANT
GLOVE SS BIOGEL STRL SZ 8.5 (GLOVE) IMPLANT
GLOVE SS N UNI LF 8.5 STRL (GLOVE) ×3 IMPLANT
GLOVE SUPERSENSE BIOGEL SZ 7 (GLOVE) ×2
GLOVE SUPERSENSE BIOGEL SZ 7.5 (GLOVE) ×2
GLOVE SUPERSENSE BIOGEL SZ 8 (GLOVE) ×2
GLOVE SUPERSENSE BIOGEL SZ 8.5 (GLOVE) ×2
GOWN STRL REUS W/TWL LRG LVL3 (GOWN DISPOSABLE) ×3 IMPLANT
GOWN STRL REUS W/TWL XL LVL3 (GOWN DISPOSABLE) ×3 IMPLANT
KIT TURNOVER KIT A (KITS) ×3 IMPLANT
MANIFOLD NEPTUNE II (INSTRUMENTS) ×3 IMPLANT
NDL HYPO 21X1.5 SAFETY (NEEDLE) ×1 IMPLANT
NEEDLE HYPO 21X1.5 SAFETY (NEEDLE) ×3 IMPLANT
NS IRRIG 1000ML POUR BTL (IV SOLUTION) ×3 IMPLANT
PACK BASIC LIMB (CUSTOM PROCEDURE TRAY) ×3 IMPLANT
PAD ARMBOARD 7.5X6 YLW CONV (MISCELLANEOUS) ×3 IMPLANT
POSITIONER HAND ALUMI XLG (MISCELLANEOUS) ×3 IMPLANT
SET BASIN LINEN APH (SET/KITS/TRAYS/PACK) ×3 IMPLANT
SPLINT IMMOBILIZER J 3INX20FT (CAST SUPPLIES) ×2
SPLINT J IMMOBILIZER 3X20FT (CAST SUPPLIES) IMPLANT
SUT ETHILON 3 0 FSL (SUTURE) ×3 IMPLANT
SUT MON AB 2-0 SH 27 (SUTURE) ×3
SUT MON AB 2-0 SH27 (SUTURE) IMPLANT
SYR CONTROL 10ML LL (SYRINGE) ×3 IMPLANT

## 2020-05-09 NOTE — Transfer of Care (Signed)
Immediate Anesthesia Transfer of Care Note  Patient: Madison Walsh  Procedure(s) Performed: Tenotomy flexor tendons right hand (Right )  Patient Location: PACU  Anesthesia Type:General  Level of Consciousness: awake  Airway & Oxygen Therapy: Patient Spontanous Breathing  Post-op Assessment: Report given to RN  Post vital signs: Reviewed  Last Vitals:  Vitals Value Taken Time  BP 119/63 05/09/20 0913  Temp    Pulse 60 05/09/20 0914  Resp 13 05/09/20 0914  SpO2 94 % 05/09/20 0914  Vitals shown include unvalidated device data.  Last Pain: There were no vitals filed for this visit.       Complications: No complications documented.

## 2020-05-09 NOTE — Brief Op Note (Signed)
05/09/2020  9:12 AM  PATIENT:  Madison Walsh  59 y.o. female  PRE-OPERATIVE DIAGNOSIS:  scar tissue/ stiffness of right hand after surgery for open reduction fixation of right wrist fracture  POST-OPERATIVE DIAGNOSIS:  scar tissue/ stiffness of right hand after surgery for open reduction fixation of right wrist fracture  PROCEDURE:  Procedure(s): Tenotomy flexor tendons right hand/wrist (Right)  SURGEON:  Surgeon(s) and Role:    Carole Civil, MD - Primary  PHYSICIAN ASSISTANT:   ASSISTANTS: Corrie Dandy   ANESTHESIA:   general / + block   EBL:  0 mL   BLOOD ADMINISTERED:none  DRAINS: none   LOCAL MEDICATIONS USED:  NONE  SPECIMEN:  No Specimen  DISPOSITION OF SPECIMEN:  N/A  COUNTS:  YES  TOURNIQUET:   Total Tourniquet Time Documented: Forearm (Right) - 56 minutes Total: Forearm (Right) - 56 minutes   DICTATION: .Viviann Spare Dictation  PLAN OF CARE: Discharge to home after PACU  PATIENT DISPOSITION:  PACU - hemodynamically stable.   Delay start of Pharmacological VTE agent (>24hrs) due to surgical blood loss or risk of bleeding: not applicable

## 2020-05-09 NOTE — Brief Op Note (Signed)
05/09/2020  9:16 AM  PATIENT:  Candee Furbish  59 y.o. female  PRE-OPERATIVE DIAGNOSIS:  scar tissue/ stiffness of right hand after surgery for open reduction fixation of right wrist fracture  POST-OPERATIVE DIAGNOSIS:  scar tissue/ stiffness of right hand after surgery for open reduction fixation of right wrist fracture  PROCEDURE:  Procedure(s): Tenotomy flexor tendons right hand/wrist (Right)  SURGEON:  Surgeon(s) and Role:    Carole Civil, MD - Primary  PHYSICIAN ASSISTANT:   ASSISTANTS: Corrie Dandy   ANESTHESIA:   general / + block   EBL:  0 mL   BLOOD ADMINISTERED:none  DRAINS: none   LOCAL MEDICATIONS USED:  NONE  SPECIMEN:  No Specimen  DISPOSITION OF SPECIMEN:  N/A  COUNTS:  YES  TOURNIQUET:   Total Tourniquet Time Documented: Forearm (Right) - 56 minutes Total: Forearm (Right) - 56 minutes   DICTATION: .Viviann Spare Dictation  PLAN OF CARE: Discharge to home after PACU  PATIENT DISPOSITION:  PACU - hemodynamically stable.   Delay start of Pharmacological VTE agent (>24hrs) due to surgical blood loss or risk of bleeding: not applicable

## 2020-05-09 NOTE — Anesthesia Postprocedure Evaluation (Signed)
Anesthesia Post Note  Patient: Madison Walsh  Procedure(s) Performed: Tenotomy flexor tendons right hand (Right )  Patient location during evaluation: Phase II Anesthesia Type: General Level of consciousness: awake and alert and oriented Pain management: pain level controlled Vital Signs Assessment: post-procedure vital signs reviewed and stable Respiratory status: spontaneous breathing and respiratory function stable Cardiovascular status: blood pressure returned to baseline and stable Postop Assessment: no apparent nausea or vomiting Anesthetic complications: no   No complications documented.   Last Vitals:  Vitals:   05/09/20 1000 05/09/20 1020  BP: 126/64 (!) 141/82  Pulse: (!) 51 62  Resp: 16 18  Temp:  36.7 C  SpO2: 95% 97%    Last Pain:  Vitals:   05/09/20 1020  TempSrc: Oral  PainSc: 0-No pain                 Aslyn Cottman C Veronique Warga

## 2020-05-09 NOTE — Op Note (Addendum)
05/09/2020  9:17 AM  PATIENT:  Madison Walsh  59 y.o. female  PRE-OPERATIVE DIAGNOSIS:  scar tissue/ stiffness of right hand after surgery for open reduction fixation of right wrist fracture  POST-OPERATIVE DIAGNOSIS:  scar tissue/ stiffness of right hand after surgery for open reduction fixation of right wrist fracture  PROCEDURE:  Procedure(s): Tenotomy flexor tendons right hand/wrist (Right)  Operative findings contracture scarring of the flexor tendons primarily of the FDS, FCR and flexor to the thumb  Median nerve intact  Preoperative flexion IP joints contracted as well.  Patient was seen in preop examined and considered ready for surgery.  Anesthesia gave her preoperative blocks she was brought to the operating room placed in the supine position and had general anesthesia.  After sterile drape and timeout tourniquet was elevated 250 mmHg after exsanguination of the limb with a 4 inch Esmarch.  Previous incision was marked and used to open the volar aspect of the wrist  Careful dissection was performed to isolate the flexor carpi radialis tendon and release scar tissue with Teno- synovectomy.  This continued with the flexor to the thumb.  And we turned our attention to the median nerve and performed a complete carpal tunnel release.  We then proceeded to release the FDS and dissect out the FDP but it did not require release.  We then flexed the tendons down and were able to bring the fingers down to make a complete fist.  Wrist motion was normal.  The wound was irrigated and closed with interrupted 2-0 Monocryl in interrupted 3-0 nylon suture  A dorsal splint was applied with the MP joints at 90 degrees flexion.  Patient was extubated taken to recovery room in stable condition  SURGEON:  Surgeon(s) and Role:    * Carole Civil, MD - Primary  PHYSICIAN ASSISTANT:   ASSISTANTS: Corrie Dandy   ANESTHESIA:   general / + block   EBL:  0 mL   BLOOD  ADMINISTERED:none  DRAINS: none   LOCAL MEDICATIONS USED:  NONE  SPECIMEN:  No Specimen  DISPOSITION OF SPECIMEN:  N/A  COUNTS:  YES  TOURNIQUET:   Total Tourniquet Time Documented: Forearm (Right) - 56 minutes Total: Forearm (Right) - 56 minutes   DICTATION: .Viviann Spare Dictation  PLAN OF CARE: Discharge to home after PACU  PATIENT DISPOSITION:  PACU - hemodynamically stable.   Delay start of Pharmacological VTE agent (>24hrs) due to surgical blood loss or risk of bleeding: not applicable

## 2020-05-09 NOTE — Discharge Instructions (Signed)
Elevate the hand

## 2020-05-09 NOTE — Interval H&P Note (Signed)
History and Physical Interval Note:  05/09/2020 7:26 AM  Madison Walsh  has presented today for surgery, with the diagnosis of scar tissue/ stiffness of right hand after surgery for open reduction fixation of right wrist fracture.  The various methods of treatment have been discussed with the patient and family. After consideration of risks, benefits and other options for treatment, the patient has consented to  Procedure(s): Tenotomy flexor tendons right hand (Right) as a surgical intervention.  The patient's history has been reviewed, patient examined, no change in status, stable for surgery.  I have reviewed the patient's chart and labs.  Questions were answered to the patient's satisfaction.     Arther Abbott

## 2020-05-09 NOTE — Anesthesia Procedure Notes (Signed)
Procedure Name: LMA Insertion Date/Time: 05/09/2020 7:52 AM Performed by: Ollen Bowl, CRNA Pre-anesthesia Checklist: Patient identified, Patient being monitored, Emergency Drugs available, Timeout performed and Suction available Patient Re-evaluated:Patient Re-evaluated prior to induction Oxygen Delivery Method: Circle System Utilized Preoxygenation: Pre-oxygenation with 100% oxygen Induction Type: IV induction Ventilation: Mask ventilation without difficulty LMA: LMA inserted LMA Size: 3.0 Number of attempts: 1 Placement Confirmation: positive ETCO2 and breath sounds checked- equal and bilateral

## 2020-05-09 NOTE — H&P (Signed)
Madison Walsh is an 59 y.o. female.   Pain paresthesias contracture right wrist hand   Past Medical History:  Diagnosis Date  . Constipation   . GERD (gastroesophageal reflux disease)   . PONV (postoperative nausea and vomiting)   . Ulcer of esophagus     Past Surgical History:  Procedure Laterality Date  . CERVICAL CONIZATION W/BX  1983  . COLONOSCOPY  06/27/2011   Procedure: COLONOSCOPY;  Surgeon: Rogene Houston, MD;  Location: [facility];  Service: Endoscopy;  Laterality: N/A;  12:00  . ESOPHAGOGASTRODUODENOSCOPY N/A 08/10/2015   Procedure: ESOPHAGOGASTRODUODENOSCOPY (EGD);  Surgeon: Rogene Houston, MD;  Location: [facility];  Service: Endoscopy;  Laterality: N/A;  1:00  . OPEN REDUCTION INTERNAL FIXATION (ORIF) DISTAL RADIAL FRACTURE Right 03/03/2020   Procedure: OPEN REDUCTION INTERNAL FIXATION (ORIF) RIGHT WRIST FRACTURE;  Surgeon: Carole Civil, MD;  Location: [facility];  Service: Orthopedics;  Laterality: Right;    Family History  Problem Relation Age of Onset  . CVA Mother   . Esophageal varices Sister   . Hiatal hernia Sister   . Colon cancer Maternal Uncle    Social History:  reports that she has been smoking cigarettes. She has a 30.00 pack-year smoking history. She has never used smokeless tobacco. She reports that she does not drink alcohol and does not use drugs.  Allergies:  Allergies  Allergen Reactions  . Dextromethorphan     Drowsy, fatigue  . Tape     Made skin peel    Medications Prior to Admission  Medication Sig Dispense Refill  . acidophilus (RISAQUAD) CAPS capsule Take 1 capsule by mouth daily.    Marland Kitchen ascorbic acid (VITAMIN C) 500 MG tablet Take 500 mg by mouth daily.    . calcium carbonate (OSCAL) 1500 (600 Ca) MG TABS tablet Take 600 mg of elemental calcium by mouth daily with breakfast.    . Cholecalciferol (VITAMIN D) 125 MCG (5000 UT) CAPS Take 5,000 Units by mouth daily.     . cloNIDine (CATAPRES) 0.1 MG tablet Take 1 tablet (0.1 mg  total) by mouth daily. 30 tablet 0  . docusate sodium (COLACE) 100 MG capsule Take 100 mg by mouth daily.    Marland Kitchen estradiol (ESTRACE) 2 MG tablet Take 2 mg by mouth daily.    Marland Kitchen gabapentin (NEURONTIN) 100 MG capsule Take 1 capsule (100 mg total) by mouth 3 (three) times daily. 90 capsule 2  . Multiple Vitamin (MULTI-VITAMIN PO) Take 1 tablet by mouth daily.     . progesterone (PROMETRIUM) 100 MG capsule Take 100 mg by mouth daily.    . Calcium Citrate-Vitamin D (CALCIUM CITRATE + D PO) Take 1 tablet by mouth daily.  (Patient not taking: Reported on 05/01/2020)      Results for orders placed or performed during the hospital encounter of 05/08/20 (from the past 48 hour(s))  SARS CORONAVIRUS 2 (TAT 6-24 HRS) Nasopharyngeal Nasopharyngeal Swab     Status: None   Collection Time: 05/08/20 10:30 AM   Specimen: Nasopharyngeal Swab  Result Value Ref Range   SARS Coronavirus 2 NEGATIVE NEGATIVE    Comment: (NOTE) SARS-CoV-2 target nucleic acids are NOT DETECTED.  The SARS-CoV-2 RNA is generally detectable in upper and lower respiratory specimens during the acute phase of infection. Negative results do not preclude SARS-CoV-2 infection, do not rule out co-infections with other pathogens, and should not be used as the sole basis for treatment or other patient management decisions. Negative results must  be combined with clinical observations, patient history, and epidemiological information. The expected result is Negative.  Fact Sheet for Patients: SugarRoll.be  Fact Sheet for Healthcare Providers: https://www.woods-mathews.com/  This test is not yet approved or cleared by the [place] FDA and  has been authorized for detection and/or diagnosis of SARS-CoV-2 by FDA under an Emergency Use Authorization (EUA). This EUA will remain  in effect (meaning this test can be used) for the duration of the COVID-19 declaration under Se ction 564(b)(1) of the Act,  21 U.S.C. section 360bbb-3(b)(1), unless the authorization is terminated or revoked sooner.  Performed at [facility], [place] [address] [place], [place] [address]   Basic metabolic panel     Status: None   Collection Time: 05/08/20 10:41 AM  Result Value Ref Range   Sodium 139 135 - 145 mmol/L   Potassium 3.8 3.5 - 5.1 mmol/L   Chloride 106 98 - 111 mmol/L   CO2 25 22 - 32 mmol/L   Glucose, Bld 80 70 - 99 mg/dL    Comment: Glucose reference range applies only to samples taken after fasting for at least 8 hours.   BUN 12 6 - 20 mg/dL   Creatinine, Ser 0.62 0.44 - 1.00 mg/dL   Calcium 9.2 8.9 - 10.3 mg/dL   GFR calc non Af Amer >60 >60 mL/min   GFR calc Af Amer >60 >60 mL/min   Anion gap 8 5 - 15    Comment: Performed at [facility], [address] [place], [place] [address]  CBC WITH DIFFERENTIAL     Status: Abnormal   Collection Time: 05/08/20 10:41 AM  Result Value Ref Range   WBC 14.0 (H) 4.0 - 10.5 K/uL   RBC 4.47 3.87 - 5.11 MIL/uL   Hemoglobin 14.1 12.0 - 15.0 g/dL   HCT 43.2 36 - 46 %   MCV 96.6 80.0 - 100.0 fL   MCH 31.5 26.0 - 34.0 pg   MCHC 32.6 30.0 - 36.0 g/dL   RDW 14.3 11.5 - 15.5 %   Platelets 307 150 - 400 K/uL   nRBC 0.0 0.0 - 0.2 %   Neutrophils Relative % 76 %   Neutro Abs 10.6 (H) 1.7 - 7.7 K/uL   Lymphocytes Relative 17 %   Lymphs Abs 2.4 0.7 - 4.0 K/uL   Monocytes Relative 6 %   Monocytes Absolute 0.8 0 - 1 K/uL   Eosinophils Relative 0 %   Eosinophils Absolute 0.0 0 - 0 K/uL   Basophils Relative 1 %   Basophils Absolute 0.1 0 - 0 K/uL   Immature Granulocytes 0 %   Abs Immature Granulocytes 0.06 0.00 - 0.07 K/uL    Comment: Performed at [facility], [address] [place], [place] [address]   No results found.  Review of Systems  Last menstrual period 06/21/2011. Physical Exam Constitutional:      General: She is not in acute distress.    Appearance: Normal appearance. She is not ill-appearing.  HENT:     Head: Normocephalic.      Nose: Nose normal.     Mouth/Throat:     Mouth: Mucous membranes are moist.  Eyes:     General:        Right eye: No discharge.        Left eye: No discharge.     Extraocular Movements: Extraocular movements intact.     Pupils: Pupils are equal, round, and reactive to light.  Musculoskeletal:  Right wrist: Deformity and tenderness present. No swelling, effusion or lacerations. Decreased range of motion. Normal pulse.     Left wrist: Normal. Normal pulse.     Right hand: Tenderness present. Decreased range of motion. Decreased sensation. There is disruption of two-point discrimination. Decreased capillary refill. Normal pulse.     Comments: Motor function hard to assess seems intact   Skin:    Capillary Refill: Capillary refill takes less than 2 seconds.     Comments: Varies cool sometimes warm   Neurological:     Mental Status: She is alert and oriented to person, place, and time.     Sensory: Sensory deficit present.  Psychiatric:        Mood and Affect: Mood normal.        Behavior: Behavior normal.        Thought Content: Thought content normal.        Judgment: Judgment normal.      Assessment/Plan Contractures right hand wrist   Release flexor tendon contractures right hand / wrist   The procedure has been fully reviewed with the patient; The risks and benefits of surgery have been discussed and explained and understood. Alternative treatment has also been reviewed, questions were encouraged and answered. The postoperative plan is also been reviewed.   Arther Abbott, MD 05/09/2020, 7:29 AM

## 2020-05-09 NOTE — Anesthesia Preprocedure Evaluation (Signed)
Anesthesia Evaluation  Patient identified by MRN, date of birth, ID band Patient awake    Reviewed: Allergy & Precautions, NPO status , Patient's Chart, lab work & pertinent test results  History of Anesthesia Complications (+) PONV and history of anesthetic complications  Airway Mallampati: II  TM Distance: >3 FB Neck ROM: Full    Dental  (+) Missing, Dental Advisory Given   Pulmonary shortness of breath and with exertion, Current Smoker and Patient abstained from smoking.,    Pulmonary exam normal breath sounds clear to auscultation       Cardiovascular Exercise Tolerance: Good Normal cardiovascular exam Rhythm:Regular Rate:Normal     Neuro/Psych negative neurological ROS  negative psych ROS   GI/Hepatic Neg liver ROS, PUD, GERD  Controlled,  Endo/Other  negative endocrine ROS  Renal/GU negative Renal ROS     Musculoskeletal negative musculoskeletal ROS (+) Right wrist fx   Abdominal   Peds  Hematology negative hematology ROS (+)   Anesthesia Other Findings   Reproductive/Obstetrics negative OB ROS                             Anesthesia Physical  Anesthesia Plan  ASA: III  Anesthesia Plan: General and Regional   Post-op Pain Management:  Regional for Post-op pain   Induction:   PONV Risk Score and Plan: 4 or greater  Airway Management Planned: LMA  Additional Equipment:   Intra-op Plan:   Post-operative Plan:   Informed Consent: I have reviewed the patients History and Physical, chart, labs and discussed the procedure including the risks, benefits and alternatives for the proposed anesthesia with the patient or authorized representative who has indicated his/her understanding and acceptance.     Dental advisory given  Plan Discussed with: CRNA and Surgeon  Anesthesia Plan Comments: (Regional and TIVA, possible GA with airway was discussed.)       Anesthesia  Quick Evaluation

## 2020-05-09 NOTE — Interval H&P Note (Signed)
History and Physical Interval Note:  05/09/2020 7:20 AM  Madison Walsh  has presented today for surgery, with the diagnosis of scar tissue/ stiffness of right hand after surgery for open reduction fixation of right wrist fracture.  The various methods of treatment have been discussed with the patient and family. After consideration of risks, benefits and other options for treatment, the patient has consented to  Procedure(s): Tenotomy flexor tendons right hand (Right) as a surgical intervention.  The patient's history has been reviewed, patient examined, no change in status, stable for surgery.  I have reviewed the patient's chart and labs.  Questions were answered to the patient's satisfaction.      Arther Abbott

## 2020-05-09 NOTE — Brief Op Note (Signed)
05/09/2020  9:17 AM  PATIENT:  Candee Furbish  59 y.o. female  PRE-OPERATIVE DIAGNOSIS:  scar tissue/ stiffness of right hand after surgery for open reduction fixation of right wrist fracture  POST-OPERATIVE DIAGNOSIS:  scar tissue/ stiffness of right hand after surgery for open reduction fixation of right wrist fracture  PROCEDURE:  Procedure(s): Tenotomy flexor tendons right hand/wrist (Right)  SURGEON:  Surgeon(s) and Role:    Carole Civil, MD - Primary  PHYSICIAN ASSISTANT:   ASSISTANTS: Corrie Dandy   ANESTHESIA:   general / + block   EBL:  0 mL   BLOOD ADMINISTERED:none  DRAINS: none   LOCAL MEDICATIONS USED:  NONE  SPECIMEN:  No Specimen  DISPOSITION OF SPECIMEN:  N/A  COUNTS:  YES  TOURNIQUET:   Total Tourniquet Time Documented: Forearm (Right) - 56 minutes Total: Forearm (Right) - 56 minutes   DICTATION: .Viviann Spare Dictation  PLAN OF CARE: Discharge to home after PACU  PATIENT DISPOSITION:  PACU - hemodynamically stable.   Delay start of Pharmacological VTE agent (>24hrs) due to surgical blood loss or risk of bleeding: not applicable

## 2020-05-10 ENCOUNTER — Ambulatory Visit (HOSPITAL_COMMUNITY): Payer: 59 | Attending: Orthopedic Surgery

## 2020-05-10 ENCOUNTER — Other Ambulatory Visit: Payer: Self-pay

## 2020-05-10 ENCOUNTER — Encounter (HOSPITAL_COMMUNITY): Payer: Self-pay | Admitting: Orthopedic Surgery

## 2020-05-10 DIAGNOSIS — R278 Other lack of coordination: Secondary | ICD-10-CM | POA: Insufficient documentation

## 2020-05-10 DIAGNOSIS — M25641 Stiffness of right hand, not elsewhere classified: Secondary | ICD-10-CM

## 2020-05-10 DIAGNOSIS — M79641 Pain in right hand: Secondary | ICD-10-CM | POA: Insufficient documentation

## 2020-05-10 DIAGNOSIS — R29898 Other symptoms and signs involving the musculoskeletal system: Secondary | ICD-10-CM

## 2020-05-10 MED ORDER — ROPIVACAINE HCL 5 MG/ML IJ SOLN
INTRAMUSCULAR | Status: DC | PRN
  Administered 2020-05-09: 24 mL via EPIDURAL

## 2020-05-10 MED ORDER — DEXAMETHASONE SODIUM PHOSPHATE 4 MG/ML IJ SOLN
INTRAMUSCULAR | Status: DC | PRN
  Administered 2020-05-09: 8 mg via PERINEURAL

## 2020-05-10 MED ORDER — LIDOCAINE HCL (PF) 1 % IJ SOLN
INTRAMUSCULAR | Status: DC | PRN
  Administered 2020-05-09: 3 mL

## 2020-05-10 NOTE — Anesthesia Procedure Notes (Addendum)
Anesthesia Regional Block: Supraclavicular block   Pre-Anesthetic Checklist: ,, timeout performed, Correct Patient, Correct Site, Correct Laterality, Correct Procedure, Correct Position, site marked, Risks and benefits discussed,  Surgical consent,  Pre-op evaluation,  At surgeon's request and post-op pain management  Laterality: Upper  Prep: chloraprep       Needles:  Injection technique: Single-shot  Needle Type: Echogenic Stimulator Needle     Needle Length: 8.3cm  Needle Gauge: 22     Additional Needles:   Procedures:,,,, ultrasound used (permanent image in chart),,,,  Narrative:  Start time: 05/09/2020 7:21 AM End time: 05/09/2020 7:30 AM Injection made incrementally with aspirations every 5 mL.  Performed by: Personally  Anesthesiologist: Denese Killings, MD  Additional Notes: Block assessed prior to start of surgery

## 2020-05-10 NOTE — Therapy (Signed)
[place], [place], [address] Phone: [phone]   Fax:  [phone]  Patient Details  Name: Madison Walsh MRN: [id number] Date of Birth: January 04, 1961 Referring Provider:  Carole Civil, MD  Encounter Date: 05/10/2020  Patient arrived with husband for splint evaluation. After discussion it was decided to wait until follow up appointment with Dr. Aline Brochure before attempting to fabricate a splint as surgical procedure was completed on 05/09/20. OT will contact Dr. Aline Brochure to confirm patient's return to clinic to receive a custom splint for right hand as well as specifics of protocol to follow. Tentative splint evaluation made for 05/11/20 at 3:15PM after patient's follow up appointment with MD.   Ailene Ravel, OTR/L,CBIS  [phone]  05/10/2020, 12:48 PM  [place], [place], [address] Phone: [phone]   Fax:  [phone]

## 2020-05-10 NOTE — Addendum Note (Signed)
Addendum  created 05/10/20 1603 by Denese Killings, MD   Charge Capture section accepted, Child order released for a procedure order, Clinical Note Signed, Intraprocedure Blocks edited

## 2020-05-11 ENCOUNTER — Ambulatory Visit (INDEPENDENT_AMBULATORY_CARE_PROVIDER_SITE_OTHER): Payer: 59 | Admitting: Orthopedic Surgery

## 2020-05-11 ENCOUNTER — Ambulatory Visit (HOSPITAL_COMMUNITY): Payer: 59

## 2020-05-11 ENCOUNTER — Encounter: Payer: Self-pay | Admitting: Orthopedic Surgery

## 2020-05-11 ENCOUNTER — Encounter (HOSPITAL_COMMUNITY): Payer: Self-pay

## 2020-05-11 ENCOUNTER — Other Ambulatory Visit: Payer: Self-pay

## 2020-05-11 VITALS — BP 112/67 | HR 74 | Wt 129.0 lb

## 2020-05-11 DIAGNOSIS — R278 Other lack of coordination: Secondary | ICD-10-CM | POA: Diagnosis present

## 2020-05-11 DIAGNOSIS — R29898 Other symptoms and signs involving the musculoskeletal system: Secondary | ICD-10-CM

## 2020-05-11 DIAGNOSIS — M79641 Pain in right hand: Secondary | ICD-10-CM

## 2020-05-11 DIAGNOSIS — Z9889 Other specified postprocedural states: Secondary | ICD-10-CM

## 2020-05-11 DIAGNOSIS — M25641 Stiffness of right hand, not elsewhere classified: Secondary | ICD-10-CM | POA: Diagnosis present

## 2020-05-11 NOTE — Patient Instructions (Signed)
Your Splint This splint should initially be fitted by a healthcare practitioner.  The healthcare practitioner is responsible for providing wearing instructions and precautions to the patient, other healthcare practitioners and care provider involved in the patient's care.  This splint was custom made for you. Please read the following instructions to learn about wearing and caring for your splint.  Precautions Should your splint cause any of the following problems, remove the splint immediately and contact your therapist/physician.  Swelling  Severe Pain  Pressure Areas  Stiffness  Numbness  Do not wear your splint while operating machinery unless it has been fabricated for that purpose.  When To Wear Your Splint Where your splint according to your therapist/physician instructions. All the time  Care and Cleaning of Your Splint 1. Keep your splint away from open flames. 2. Your splint will lose its shape in temperatures over 135 degrees Farenheit, ( in car windows, near radiators, ovens or in hot water).  Never make any adjustments to your splint, if the splint needs adjusting remove it and make an appointment to see your therapist. 3. Your splint, including the cushion liner may be cleaned with soap and lukewarm water.  Do not immerse in hot water over 135 degrees Farenheit. 4. Straps may be washed with soap and water, but do not moisten the self-adhesive portion. 5. For ink or hard to remove spots use a scouring cleanser which contains chlorine.  Rinse the splint thoroughly after using chlorine cleanser.    flexor tendon stretch  Using uninvolved hand, bring all digits into a half fist (or 50% of your available passive motion), then let go with uninvolved hand and try to hold the position. Actively straighten back to the splint in between each repetition. These are called place/hold  digit exercises. Complete 10 times periodically throughout the day. (Suggestion: once a hour)

## 2020-05-11 NOTE — Addendum Note (Signed)
Addended byCandice Camp on: 05/11/2020 02:39 PM   Modules accepted: Orders

## 2020-05-11 NOTE — Progress Notes (Signed)
Postop day #2 postop visit #1 status post lysis of adhesions right hand wound check.  Complains of numbness in the right thumb  Patient to get OT splint today  Follow-up in 2 weeks to check motion

## 2020-05-12 ENCOUNTER — Ambulatory Visit: Payer: 59 | Admitting: Orthopedic Surgery

## 2020-05-12 NOTE — Therapy (Signed)
[place], [place], [address] Phone: [phone]   Fax:  [phone]  Occupational Therapy Evaluation  Patient Details  Name: Madison Walsh MRN: [id number] Date of Birth: 1961-02-20 Referring Provider (OT): Arther Abbott, MD   Encounter Date: 05/11/2020   OT End of Session - 05/12/20 0850    Visit Number 1    Number of Visits 2    Date for OT Re-Evaluation 06/08/20    Authorization Type [employer]    Authorization Time Period 60 visit limit. No copay    Authorization - Visit Number 1    Authorization - Number of Visits 22    OT Start Time 1520    OT Stop Time 1625    OT Time Calculation (min) 65 min    Activity Tolerance Patient tolerated treatment well    Behavior During Therapy WFL for tasks assessed/performed           Past Medical History:  Diagnosis Date  . Constipation   . GERD (gastroesophageal reflux disease)   . PONV (postoperative nausea and vomiting)   . Ulcer of esophagus     Past Surgical History:  Procedure Laterality Date  . CERVICAL CONIZATION W/BX  1983  . COLONOSCOPY  06/27/2011   Procedure: COLONOSCOPY;  Surgeon: Rogene Houston, MD;  [facility];  Service: Endoscopy;  Laterality: N/A;  12:00  . ESOPHAGOGASTRODUODENOSCOPY N/A 08/10/2015   Procedure: ESOPHAGOGASTRODUODENOSCOPY (EGD);  Surgeon: Rogene Houston, MD;  [facility]: [facility];  Service: Endoscopy;  Laterality: N/A;  1:00  . FLEXOR TENOTOMY Right 05/09/2020   Procedure: Tenotomy flexor tendons right hand;  Surgeon: Carole Civil, MD;  Location: [facility];  Service: Orthopedics;  Laterality: Right;  . OPEN REDUCTION INTERNAL FIXATION (ORIF) DISTAL RADIAL FRACTURE Right 03/03/2020   Procedure: OPEN REDUCTION INTERNAL FIXATION (ORIF) RIGHT WRIST FRACTURE;  Surgeon: Carole Civil, MD;  Location: [facility];  Service: Orthopedics;  Laterality: Right;    There were no vitals filed for this visit.    Subjective Assessment - 05/11/20 1753    Subjective  S: Last night was a rough night. It feels better now though.    Patient is accompanied by: Family member   Husband: Chuck   Pertinent History Patient is a 59 y/o female S/P Tenotomy flexor tendons of the right hand which was performed on 05/09/20. Patient's follow up visit with Dr. Aline Brochure was this afternoon in which he removed the surgical bandages and the hard cast. Pt presents to occupational therapy this date for a splint fabrication.    Patient Stated Goals movement of right hand.    Currently in Pain? Yes    Pain Score 4     Pain Location Wrist    Pain Orientation Right    Pain Descriptors / Indicators Aching    Pain Type Surgical pain             [facility] OT Assessment - 05/11/20 1755      Assessment   Medical Diagnosis Tenotomy Flexor tendons    Referring Provider (OT) Arther Abbott, MD    Onset Date/Surgical Date 05/09/20    Hand Dominance Right    Next MD Visit 05/11/20    Prior Therapy None for this referral      Precautions   Precautions Other (comment)    Precaution Comments No active finger flexion. Only extension. Remain in splint at all times.    Required Braces or Orthoses  Other Brace/Splint    Other Brace/Splint Dorsal blocking orthosis (made today)      Restrictions   Weight Bearing Restrictions Yes    RUE Weight Bearing Non weight bearing      Balance Screen   Has the patient fallen in the past 6 months Yes    How many times? 1    Has the patient had a decrease in activity level because of a fear of falling?  No    Is the patient reluctant to leave their home because of a fear of falling?  No      Home  Environment   Family/patient expects to be discharged to: Private residence    Living Arrangements Spouse/significant other      Prior Function   Level of Independence Independent    Vocation On disability      ADL   ADL comments Unable to utilize RUE for any daily tasks at this time.        Mobility   Mobility Status Independent      Vision - History   Baseline Vision Wears glasses only for reading      Cognition   Overall Cognitive Status Within Functional Limits for tasks assessed      Observation/Other Assessments   Focus on Therapeutic Outcomes (FOTO)  N/A      Edema   Edema Edema present from recent procedure.       ROM / Strength   AROM / PROM / Strength PROM;AROM;Strength      AROM   Overall AROM  Unable to assess;Due to precautions    Overall AROM Comments When fingers flexed passively, patient was able to actively extend them to splint limits.       PROM   Overall PROM  Deficits    Overall PROM Comments Patient able to achieve approximately 50% full finger composite flexion. Wrist remained in nuetral with ace bandage intact. Thumb with no movement passively.                     OT Treatments/Exercises (OP) - 05/12/20 8338      Splinting   Splinting Dorsal flexor tendon splint fabricated with MCP joints in 50 degrees extension, DIP and PIP joints full extension, wrist nuetral. Two 3in neoprene straps placed at forearm and wrist. 3in neoprene strap cut in half and placed over PIP joints. Rolyan loop non adhesive 1 in strap placed over DIP joints. Pt provided with sockette and provided with extra.                  OT Education - 05/12/20 0830    Education Details Discussed splint wearing schedule, care management, donning/doffing, precautions. HEP: passive flexion and active extension.    Person(s) Educated Patient;Spouse    Methods Explanation;Demonstration;Handout;Verbal cues    Comprehension Returned demonstration;Verbalized understanding            OT Short Term Goals - 05/12/20 0921      OT SHORT TERM GOAL #1   Title Patient will be educated and independent with HEP in order to safely mobilize her right hand in order to decrease risk of contracture and increase functional ROM.    Time 4    Period Weeks    Status New     Target Date 06/08/20      OT SHORT TERM GOAL #2   Title Patient will be provided a fabricated right hand dynamic splint in order to provided joint protection during healing  phase of procedure while allowing for appropriate ROM.    Time 4    Period Weeks    Status New                    Plan - 05/12/20 0347    Clinical Impression Statement A: Patient is a 59 y/o female S/P tenotomy flexor tendons of the right hand with need for dynamic splint to allow her gain composite flinger flexion and decrease the risk of contractures in order to work towards increasing her ability to return to using her right hand as dominant.    OT Occupational Profile and History Problem Focused Assessment - Including review of records relating to presenting problem    Occupational performance deficits (Please refer to evaluation for details): ADL's;IADL's;Rest and Sleep;Leisure    Body Structure / Function / Physical Skills ADL;UE functional use;Fascial restriction;Pain;FMC;ROM;GMC;Coordination;IADL;Strength;Edema    Rehab Potential Excellent    Clinical Decision Making Several treatment options, min-mod task modification necessary    Comorbidities Affecting Occupational Performance: May have comorbidities impacting occupational performance    Modification or Assistance to Complete Evaluation  No modification of tasks or assist necessary to complete eval    OT Frequency 1x / week   1 more visit needed to finish splint   OT Duration 4 weeks    OT Treatment/Interventions Splinting;Self-care/ADL training    Plan P: Patient will benefit from skilled OT services for dynamic splint fabrication which will allow her to safely heal and regain ROM safely within MD's recommendations.    OT Home Exercise Plan eval: passive finger flexion with active extension. Splint education.    Consulted and Agree with Plan of Care Patient;Family member/caregiver    Family Member Consulted Husband: Chuck           Patient will  benefit from skilled therapeutic intervention in order to improve the following deficits and impairments:   Body Structure / Function / Physical Skills: ADL, UE functional use, Fascial restriction, Pain, FMC, ROM, GMC, Coordination, IADL, Strength, Edema       Visit Diagnosis: Other symptoms and signs involving the musculoskeletal system - Plan: Ot plan of care cert/re-cert  Other lack of coordination - Plan: Ot plan of care cert/re-cert  Stiffness of right hand, not elsewhere classified - Plan: Ot plan of care cert/re-cert  Pain in right hand - Plan: Ot plan of care cert/re-cert    Problem List Patient Active Problem List   Diagnosis Date Noted  . S/P carpal tunnel release and right hand flexor tenodesis multiple on 05/09/20 05/09/2020  . Contracture of joint of right forearm   . S/P ORIF (open reduction internal fixation) fracture right wrist 03/03/20 03/07/2020  . Closed Barton's fracture of right radius   . Kyphosis of thoracic region 10/03/2017  . Erosive esophagitis 03/08/2013  . Unspecified constipation 03/08/2013   Ailene Ravel, OTR/L,CBIS  [phone]  05/12/2020, 9:35 AM  [facility] [address] [place], [place], [address] Phone: [phone]   Fax:  [phone]  Name: Madison Walsh MRN: [id number] Date of Birth: Jan 23, 1961

## 2020-05-17 NOTE — Addendum Note (Signed)
Addendum  created 05/17/20 6789 by Denese Killings, MD   Clinical Note Signed, Intraprocedure Blocks edited

## 2020-05-18 ENCOUNTER — Encounter (HOSPITAL_COMMUNITY): Payer: Self-pay

## 2020-05-18 ENCOUNTER — Ambulatory Visit (HOSPITAL_COMMUNITY): Payer: 59

## 2020-05-18 ENCOUNTER — Other Ambulatory Visit: Payer: Self-pay

## 2020-05-18 DIAGNOSIS — M25641 Stiffness of right hand, not elsewhere classified: Secondary | ICD-10-CM

## 2020-05-18 DIAGNOSIS — R29898 Other symptoms and signs involving the musculoskeletal system: Secondary | ICD-10-CM

## 2020-05-18 DIAGNOSIS — R278 Other lack of coordination: Secondary | ICD-10-CM

## 2020-05-18 DIAGNOSIS — M79641 Pain in right hand: Secondary | ICD-10-CM

## 2020-05-18 NOTE — Therapy (Signed)
[place], [place], [address] Phone: [phone]   Fax:  [phone]  Occupational Therapy Treatment  Patient Details  Name: Madison Walsh MRN: [id number] Date of Birth: [birth year] Referring Provider (OT): Arther Abbott, MD   Encounter Date: 05/18/2020   OT End of Session - 05/18/20 1246    Visit Number 2    Number of Visits 2    Date for OT Re-Evaluation 06/08/20    Authorization Type [employer]    Authorization Time Period 60 visit limit. No copay    Authorization - Visit Number 2    Authorization - Number of Visits 60    OT Start Time 8563    OT Stop Time 1100    OT Time Calculation (min) 25 min    Activity Tolerance Patient tolerated treatment well    Behavior During Therapy WFL for tasks assessed/performed           Past Medical History:  Diagnosis Date  . Constipation   . GERD (gastroesophageal reflux disease)   . PONV (postoperative nausea and vomiting)   . Ulcer of esophagus     Past Surgical History:  Procedure Laterality Date  . CERVICAL CONIZATION W/BX  1983  . COLONOSCOPY  06/27/2011   Procedure: COLONOSCOPY;  Surgeon: Rogene Houston, MD;  [facility];  Service: Endoscopy;  Laterality: N/A;  12:00  . ESOPHAGOGASTRODUODENOSCOPY N/A 08/10/2015   Procedure: ESOPHAGOGASTRODUODENOSCOPY (EGD);  Surgeon: Rogene Houston, MD;  [facility];  Service: Endoscopy;  Laterality: N/A;  1:00  . FLEXOR TENOTOMY Right 05/09/2020   Procedure: Tenotomy flexor tendons right hand;  Surgeon: Carole Civil, MD;  Location: [facility];  Service: Orthopedics;  Laterality: Right;  . OPEN REDUCTION INTERNAL FIXATION (ORIF) DISTAL RADIAL FRACTURE Right 03/03/2020   Procedure: OPEN REDUCTION INTERNAL FIXATION (ORIF) RIGHT WRIST FRACTURE;  Surgeon: Carole Civil, MD;  Location: [facility];  Service: Orthopedics;  Laterality: Right;    There were no vitals filed for this visit.    Subjective Assessment - 05/18/20 1109    Subjective  S: I'm no longer wearing the ace bandage wrap so the splint seems to fit different now.    Patient is accompanied by: Family member   Husband: Chuck   Currently in Pain? Yes    Pain Score 4     Pain Location Wrist    Pain Orientation Right    Pain Descriptors / Indicators Aching;Sore    Pain Type Surgical pain    Pain Radiating Towards N/A    Pain Onset 1 to 4 weeks ago    Pain Frequency Constant    Aggravating Factors  Unknown    Pain Relieving Factors prescription medication    Effect of Pain on Daily Activities unable to utilizes her right hand for any daily tasks.    Multiple Pain Sites No              [facility] OT Assessment - 05/18/20 1241      Assessment   Medical Diagnosis Tenotomy Flexor tendons      Precautions   Precautions Other (comment)    Precaution Comments No active finger flexion. Only extension. Remain in splint at all times.    Required Braces or Orthoses Other Brace/Splint    Other Brace/Splint Dorsal blocking orthosis (made today)                    OT Treatments/Exercises (OP) -  05/18/20 1241      Splinting   Splinting Adjusted hard splint support from 5th digit across plamer crease. applied a V shape strap attaching at palm, running along palmer crease attaching inferior and superior to MCP joints.                     OT Short Term Goals - 05/12/20 0347      OT SHORT TERM GOAL #1   Title Patient will be educated and independent with HEP in order to safely mobilize her right hand in order to decrease risk of contracture and increase functional ROM.    Time 4    Period Weeks    Status New    Target Date 06/08/20      OT SHORT TERM GOAL #2   Title Patient will be provided a fabricated right hand dynamic splint in order to provided joint protection during healing phase of procedure while allowing for appropriate ROM.    Time 4    Period Weeks    Status New                     Plan - 05/18/20 1247    Clinical Impression Statement A: Adjustments made to static splint per patient's request as she is no longer wear ace bandage around wound. New V strap applied along palmer crease and MCP joints to provide increased stability. All education was provided on donning/doffing and precautions. Pt and husband verbalized understanding.    Body Structure / Function / Physical Skills ADL;UE functional use;Fascial restriction;Pain;FMC;ROM;GMC;Coordination;IADL;Strength;Edema    Plan P: Fabricate dynamic splint when kit arrives.    Consulted and Agree with Plan of Care Patient;Family member/caregiver    Family Member Consulted Husband: Chuck           Patient will benefit from skilled therapeutic intervention in order to improve the following deficits and impairments:   Body Structure / Function / Physical Skills: ADL, UE functional use, Fascial restriction, Pain, FMC, ROM, GMC, Coordination, IADL, Strength, Edema       Visit Diagnosis: Stiffness of right hand, not elsewhere classified  Other lack of coordination  Pain in right hand  Other symptoms and signs involving the musculoskeletal system    Problem List Patient Active Problem List   Diagnosis Date Noted  . S/P carpal tunnel release and right hand flexor tenodesis multiple on 05/09/20 05/09/2020  . Contracture of joint of right forearm   . S/P ORIF (open reduction internal fixation) fracture right wrist 03/03/20 03/07/2020  . Closed Barton's fracture of right radius   . Kyphosis of thoracic region 10/03/2017  . Erosive esophagitis 03/08/2013  . Unspecified constipation 03/08/2013   Ailene Ravel, OTR/L,CBIS  [phone]  05/18/2020, 12:50 PM  [facility] [address] [place], [place], [address] Phone: [phone]   Fax:  [phone]  Name: Madison Walsh MRN: [id number] Date of Birth: 1960-10-04

## 2020-05-25 ENCOUNTER — Encounter: Payer: Self-pay | Admitting: Orthopedic Surgery

## 2020-05-25 ENCOUNTER — Other Ambulatory Visit: Payer: Self-pay

## 2020-05-25 ENCOUNTER — Ambulatory Visit (INDEPENDENT_AMBULATORY_CARE_PROVIDER_SITE_OTHER): Payer: 59 | Admitting: Orthopedic Surgery

## 2020-05-25 DIAGNOSIS — Z8781 Personal history of (healed) traumatic fracture: Secondary | ICD-10-CM

## 2020-05-25 DIAGNOSIS — Z9889 Other specified postprocedural states: Secondary | ICD-10-CM

## 2020-05-25 MED ORDER — HYDROCODONE-ACETAMINOPHEN 5-325 MG PO TABS
1.0000 | ORAL_TABLET | Freq: Four times a day (QID) | ORAL | 0 refills | Status: DC | PRN
Start: 2020-05-25 — End: 2021-04-23

## 2020-05-25 NOTE — Patient Instructions (Signed)
Physical therapy has been ordered for you at [facility]. They should call you to schedule, [phone] is the phone number to call   

## 2020-05-25 NOTE — Progress Notes (Signed)
Chief Complaint  Patient presents with  . Routine Post Op    05/09/20 right hand flexor tendon release multiple and CTR     Status post lysis of adhesions after ORIF of a Barton's fracture dislocation  Possible early complex regional pain syndrome responding well to clonidine and gabapentin  Her hand looks really good today.  She does have better extension as well as flexion but she has not gotten her brace she is in a temporary holding splint  Hopefully we can get her in for therapy early next week if not we will have to seek another arrangement concerned as well as she is that she might lose some ground so far so good though.  Wound looks good sutures are extracted  Continue splinting and take medications as she has been doing   Current Outpatient Medications:  .  acidophilus (RISAQUAD) CAPS capsule, Take 1 capsule by mouth daily., Disp: , Rfl:  .  ascorbic acid (VITAMIN C) 500 MG tablet, Take 500 mg by mouth daily., Disp: , Rfl:  .  calcium carbonate (OSCAL) 1500 (600 Ca) MG TABS tablet, Take 600 mg of elemental calcium by mouth daily with breakfast., Disp: , Rfl:  .  Cholecalciferol (VITAMIN D) 125 MCG (5000 UT) CAPS, Take 5,000 Units by mouth daily. , Disp: , Rfl:  .  cloNIDine (CATAPRES) 0.1 MG tablet, Take 1 tablet (0.1 mg total) by mouth daily., Disp: 30 tablet, Rfl: 0 .  docusate sodium (COLACE) 100 MG capsule, Take 100 mg by mouth daily., Disp: , Rfl:  .  estradiol (ESTRACE) 2 MG tablet, Take 2 mg by mouth daily., Disp: , Rfl:  .  gabapentin (NEURONTIN) 100 MG capsule, Take 1 capsule (100 mg total) by mouth 3 (three) times daily., Disp: 90 capsule, Rfl: 2 .  HYDROcodone-acetaminophen (NORCO/VICODIN) 5-325 MG tablet, Take 1 tablet by mouth every 6 (six) hours as needed for moderate pain., Disp: 30 tablet, Rfl: 0 .  ibuprofen (ADVIL) 100 MG/5ML suspension, Take 200 mg by mouth every 6 (six) hours as needed., Disp: , Rfl:  .  Multiple Vitamin (MULTI-VITAMIN PO), Take 1 tablet by  mouth daily. , Disp: , Rfl:  .  progesterone (PROMETRIUM) 100 MG capsule, Take 100 mg by mouth daily., Disp: , Rfl:     Meds ordered this encounter  Medications  . HYDROcodone-acetaminophen (NORCO/VICODIN) 5-325 MG tablet    Sig: Take 1 tablet by mouth every 6 (six) hours as needed for moderate pain.    Dispense:  30 tablet    Refill:  0

## 2020-05-29 ENCOUNTER — Other Ambulatory Visit: Payer: Self-pay

## 2020-05-29 ENCOUNTER — Ambulatory Visit (HOSPITAL_COMMUNITY): Payer: 59 | Admitting: Specialist

## 2020-05-29 DIAGNOSIS — R29898 Other symptoms and signs involving the musculoskeletal system: Secondary | ICD-10-CM | POA: Diagnosis not present

## 2020-05-29 DIAGNOSIS — R278 Other lack of coordination: Secondary | ICD-10-CM

## 2020-05-29 DIAGNOSIS — M79641 Pain in right hand: Secondary | ICD-10-CM

## 2020-05-29 DIAGNOSIS — M25641 Stiffness of right hand, not elsewhere classified: Secondary | ICD-10-CM

## 2020-05-30 ENCOUNTER — Encounter (HOSPITAL_COMMUNITY): Payer: Self-pay | Admitting: Specialist

## 2020-05-30 NOTE — Therapy (Signed)
[place], [place], [address] Phone: [phone]   Fax:  [phone]  Occupational Therapy Treatment  Patient Details  Name: Madison Walsh MRN: [id number] Date of Birth: 1960-12-28 Referring Provider (OT): Arther Abbott, MD   Encounter Date: 05/29/2020   OT End of Session - 05/30/20 0913    Visit Number 3    Number of Visits 18    Date for OT Re-Evaluation 07/24/20   mini reassess on 10/18   Authorization Type [employer]    Authorization Time Period 60 visit limit. No copay    Authorization - Visit Number 3    Authorization - Number of Visits 14    OT Start Time 1430    OT Stop Time 1515    OT Time Calculation (min) 45 min    Activity Tolerance Patient tolerated treatment well    Behavior During Therapy WFL for tasks assessed/performed           Past Medical History:  Diagnosis Date  . Constipation   . GERD (gastroesophageal reflux disease)   . PONV (postoperative nausea and vomiting)   . Ulcer of esophagus     Past Surgical History:  Procedure Laterality Date  . CERVICAL CONIZATION W/BX  1983  . COLONOSCOPY  06/27/2011   Procedure: COLONOSCOPY;  Surgeon: Rogene Houston, MD;  [facility];  Service: Endoscopy;  Laterality: N/A;  12:00  . ESOPHAGOGASTRODUODENOSCOPY N/A 08/10/2015   Procedure: ESOPHAGOGASTRODUODENOSCOPY (EGD);  Surgeon: Rogene Houston, MD;  [facility];  Service: Endoscopy;  Laterality: N/A;  1:00  . FLEXOR TENOTOMY Right 05/09/2020   Procedure: Tenotomy flexor tendons right hand;  Surgeon: Carole Civil, MD;  Location: [facility];  Service: Orthopedics;  Laterality: Right;  . OPEN REDUCTION INTERNAL FIXATION (ORIF) DISTAL RADIAL FRACTURE Right 03/03/2020   Procedure: OPEN REDUCTION INTERNAL FIXATION (ORIF) RIGHT WRIST FRACTURE;  Surgeon: Carole Civil, MD;  Location: [facility];  Service: Orthopedics;  Laterality: Right;    There were no vitals filed  for this visit.   Subjective Assessment - 05/30/20 0912    Subjective  S:  I have been doing the exercises that Madison Walsh gave me as much as I can.    Patient Stated Goals movement of right hand.    Currently in Pain? Yes    Pain Score 3     Pain Location Wrist    Pain Orientation Right    Pain Descriptors / Indicators Aching;Sore    Pain Type Acute pain    Pain Onset 1 to 4 weeks ago    Pain Frequency Intermittent    Aggravating Factors  movement    Pain Relieving Factors medication              [facility] OT Assessment - 05/30/20 0001      Assessment   Medical Diagnosis Tenotomy Flexor tendons    Referring Provider (OT) Arther Abbott, MD    Onset Date/Surgical Date 05/09/20    Hand Dominance Right      Precautions   Precautions Other (comment)    Precaution Comments No active finger flexion. Only extension. Remain in splint at all times.    Required Braces or Orthoses Other Brace/Splint    Other Brace/Splint Dorsal blocking orthosis (made today)      Restrictions   Weight Bearing Restrictions Yes    RUE Weight Bearing Non weight bearing      Home  Environment  Family/patient expects to be discharged to: Private residence    Living Arrangements Spouse/significant other      Prior Function   Level of [place] Requirements computer work     Leisure walking her dog      ADL   ADL comments Unable to utilize RUE for any daily tasks at this time.       Cognition   Overall Cognitive Status Within Functional Limits for tasks assessed      Observation/Other Assessments   Other Surveys  Select    Quick DASH  75      Sensation   Light Touch Appears Intact      Coordination   Gross Motor Movements are Fluid and Coordinated Yes    Fine Motor Movements are Fluid and Coordinated No    Coordination and Movement Description further assessment needed      Palpation   Palpation comment max fascial restrictions in RUE        AROM   Overall AROM Comments not assessed due to precautions       PROM   PROM Assessment Site Forearm;Wrist    Right/Left Forearm Right    Right Forearm Pronation 40 Degrees    Right Forearm Supination 65 Degrees    Right/Left Wrist Right    Right Wrist Extension 12 Degrees    Right Wrist Flexion 22 Degrees      Strength   Overall Strength Comments not assessed due to recent surgery       Right Hand PROM   R Thumb MCP 0-60 50 Degrees    R Thumb IP 0-80 40 Degrees    R Index  MCP 0-90 70 Degrees    R Index PIP 0-100 72 Degrees    R Index DIP 0-70 38 Degrees    R Long  MCP 0-90 66 Degrees    R Long PIP 0-100 70 Degrees    R Long DIP 0-70 20 Degrees    R Ring  MCP 0-90 68 Degrees    R Ring PIP 0-100 70 Degrees    R Ring DIP 0-70 90 Degrees    R Little  MCP 0-90 62 Degrees    R Little PIP 0-100 70 Degrees    R Little DIP 0-70 50 Degrees              Quick Dash - 05/30/20 0001    Open a tight or new jar Unable    Do heavy household chores (wash walls, wash floors) Unable    Carry a shopping bag or briefcase Unable    Wash your back Unable    Use a knife to cut food Unable    Recreational activities in which you take some force or impact through your arm, shoulder, or hand (golf, hammering, tennis) Unable    During the past week, to what extent has your arm, shoulder or hand problem interfered with your normal social activities with family, friends, neighbors, or groups? Slightly    During the past week, to what extent has your arm, shoulder or hand problem limited your work or other regular daily activities Extremely    Arm, shoulder, or hand pain. Moderate    Tingling (pins and needles) in your arm, shoulder, or hand None    Difficulty Sleeping Moderate difficulty    DASH Score 75 %  OT Treatments/Exercises (OP) - 05/30/20 0001      Exercises   Exercises Wrist;Hand      Wrist Exercises   Wrist Flexion PROM;10 reps    Wrist Extension  PROM;10 reps    Wrist Radial Deviation PROM;10 reps    Wrist Ulnar Deviation PROM;10 reps    Other wrist exercises supination/pronation PROM 10 times       Hand Exercises   Other Hand Exercises P/ROM all digits, joints 5 times each within constraints of splint, extension of each MCPJ to constraints of splint       Manual Therapy   Manual Therapy Myofascial release    Manual therapy comments manual therapy completed seperately from all other interventions    Myofascial Release myofascial release and manual stretching to decrease pain and restrictions and improve pain free mobility in right hand, wrist, and forearm                   OT Education - 05/30/20 0913    Education Details reviewed HEP and encouraged patient to continue as previously instructed    Person(s) Educated Patient    Methods Explanation    Comprehension Verbalized understanding            OT Short Term Goals - 05/30/20 8850      OT SHORT TERM GOAL #1   Title Patient will be educated and independent with HEP in order to safely mobilize her right hand in order to decrease risk of contracture and increase functional ROM.    Time 4    Period Weeks    Status New    Target Date 06/27/20      OT SHORT TERM GOAL #2   Title Patient will be provided a fabricated right hand dynamic splint in order to provided joint protection during healing phase of procedure while allowing for appropriate ROM.    Time 4    Period Weeks    Status New      OT SHORT TERM GOAL #3   Title Patient will improve RUE forearm, wrist, and hand P/ROM to WNL for improved functional use of RUE.    Time 4    Period Weeks    Status New      OT SHORT TERM GOAL #4   Title Patient will decrease pain to 3/10 or better in RUE.    Time 4    Period Weeks    Status New             OT Long Term Goals - 05/30/20 2774      OT LONG TERM GOAL #1   Title Patient will return to prior level of function using her RUE as dominant with daily  tasks.    Time 8    Period Weeks    Status New    Target Date 07/25/20      OT LONG TERM GOAL #2   Title Patient will improve RUE a/rom to [facility] for improved ability to write, drive, and complete desired daily tasks.    Time 8    Period Weeks    Status New      OT LONG TERM GOAL #3   Title Patient will improve RUE strength to 4/5 or better for improved ability to complete tasks such as picking up bags of groceries.    Time 8    Period Weeks    Status New      OT LONG TERM GOAL #4   Title Patient  will improve RUE grip and pinch strength to [facility] in order to open up jars and containers.    Time 8    Period Weeks    Status New      OT LONG TERM GOAL #5   Title Patient will demonstrate WFL fine motor coordination in her right hand for improved ability to fasten jewelery and write with her dominant right hand.    Time 8    Period Weeks    Status New                 Plan - 05/30/20 1610    Clinical Impression Statement A:  Completed assessment of p/rom of right wrist, forearm, and hand this date.  completed manual therapy and p/rom of digit flexion, extension within parameters of splint, wrist, forearm this date. Patient is completing HEP as prescribed.  P/ROM is progressing nicely.    OT Occupational Profile and History Problem Focused Assessment - Including review of records relating to presenting problem    Occupational performance deficits (Please refer to evaluation for details): ADL's;IADL's;Rest and Sleep;Leisure    Body Structure / Function / Physical Skills ADL;UE functional use;Fascial restriction;Pain;FMC;ROM;GMC;Coordination;IADL;Strength;Edema    Rehab Potential Excellent    Clinical Decision Making Several treatment options, min-mod task modification necessary    Comorbidities Affecting Occupational Performance: May have comorbidities impacting occupational performance    Modification or Assistance to Complete Evaluation  No modification of tasks or assist necessary  to complete eval    OT Frequency 2x / week    OT Duration 8 weeks    OT Treatment/Interventions Self-care/ADL training;Electrical Stimulation;Therapeutic exercise;Patient/family education;Splinting;Neuromuscular education;Moist Heat;Energy conservation;Scar mobilization;Therapeutic activities;Passive range of motion;Manual Therapy;DME and/or AE instruction;Ultrasound;Cryotherapy;Contrast Bath    Plan P:  Conitnue P/ROM within parameters of splint, manual therapy and scar massage to decrease restrictions and improve functional use of right hand.  modify splint for passive digit flexion active extension as parts are received.           Patient will benefit from skilled therapeutic intervention in order to improve the following deficits and impairments:   Body Structure / Function / Physical Skills: ADL, UE functional use, Fascial restriction, Pain, FMC, ROM, GMC, Coordination, IADL, Strength, Edema       Visit Diagnosis: Stiffness of right hand, not elsewhere classified - Plan: Ot plan of care cert/re-cert  Other lack of coordination - Plan: Ot plan of care cert/re-cert  Pain in right hand - Plan: Ot plan of care cert/re-cert  Other symptoms and signs involving the musculoskeletal system - Plan: Ot plan of care cert/re-cert    Problem List Patient Active Problem List   Diagnosis Date Noted  . S/P carpal tunnel release and right hand flexor tenodesis multiple on 05/09/20 05/09/2020  . Contracture of joint of right forearm   . Complex regional pain syndrome i of right lower limb 04/28/2020  . S/P ORIF (open reduction internal fixation) fracture right wrist 03/03/20 03/07/2020  . Closed Barton's fracture of right radius   . Kyphosis of thoracic region 10/03/2017  . Erosive esophagitis 03/08/2013  . Unspecified constipation 03/08/2013    Vangie Bicker, [place], OTR/L [phone]  05/30/2020, 9:46 AM  [place], [place], [address] Phone: [phone]   Fax:  [phone]  Name: Madison Walsh MRN: [id number] Date of Birth: 1961/05/12

## 2020-05-31 ENCOUNTER — Ambulatory Visit (HOSPITAL_COMMUNITY): Payer: 59 | Admitting: Specialist

## 2020-05-31 ENCOUNTER — Encounter (HOSPITAL_COMMUNITY): Payer: Self-pay | Admitting: Specialist

## 2020-05-31 ENCOUNTER — Other Ambulatory Visit: Payer: Self-pay

## 2020-05-31 DIAGNOSIS — R278 Other lack of coordination: Secondary | ICD-10-CM

## 2020-05-31 DIAGNOSIS — M25641 Stiffness of right hand, not elsewhere classified: Secondary | ICD-10-CM

## 2020-05-31 DIAGNOSIS — M79641 Pain in right hand: Secondary | ICD-10-CM

## 2020-05-31 DIAGNOSIS — R29898 Other symptoms and signs involving the musculoskeletal system: Secondary | ICD-10-CM

## 2020-05-31 NOTE — Patient Instructions (Signed)
Your Splint This splint should initially be fitted by a healthcare practitioner.  The healthcare practitioner is responsible for providing wearing instructions and precautions to the patient, other healthcare practitioners and care provider involved in the patient's care.  This splint was custom made for you. Please read the following instructions to learn about wearing and caring for your splint.  Precautions Should your splint cause any of the following problems, remove the splint immediately and contact your therapist/physician.  Swelling  Severe Pain  Pressure Areas  Stiffness  Numbness  Do not wear your splint while operating machinery unless it has been fabricated for that purpose.  When To Wear Your Splint Where your splint according to your therapist/physician instructions. 15-30 minutes flexed every hour, and then remove flexion velcro and work on extension or resting position.   Care and Cleaning of Your Splint 1. Keep your splint away from open flames. 2. Your splint will lose its shape in temperatures over 135 degrees Farenheit, ( in car windows, near radiators, ovens or in hot water).  Never make any adjustments to your splint, if the splint needs adjusting remove it and make an appointment to see your therapist. 3. Your splint, including the cushion liner may be cleaned with soap and lukewarm water.  Do not immerse in hot water over 135 degrees Farenheit. 4. Straps may be washed with soap and water, but do not moisten the self-adhesive portion. 5. For ink or hard to remove spots use a scouring cleanser which contains chlorine.  Rinse the splint thoroughly after using chlorine cleanser.  Posterior Capsule Stretch   Stand or sit, one arm across body so hand rests over opposite shoulder. Gently push on crossed elbow with other hand until stretch is felt in shoulder of crossed arm. Hold _10-15__ seconds.  Repeat _2__ times per session. Do ___ sessions per day.   Wall  Flexion  Slide your arm up the wall or door frame until a stretch is felt in your shoulder . Hold for 10-15 seconds. Complete 2 times     Shoulder Abduction Stretch  Stand side ways by a wall with affected up on wall. Gently step in toward wall to feel stretch. Hold for 10-15 seconds. Complete 2 times.   stre

## 2020-05-31 NOTE — Therapy (Signed)
[place], [place], [address] Phone: [phone]   Fax:  [phone]  Occupational Therapy Treatment  Patient Details  Name: Madison Walsh MRN: [id number] Date of Birth: [birth year] Referring Provider (OT): Arther Abbott, MD   Encounter Date: 05/31/2020   OT End of Session - 05/31/20 5366    Visit Number 4    Number of Visits 18    Date for OT Re-Evaluation 07/24/20   mini reassess on 10/18   Authorization Type [employer]    Authorization Time Period 60 visit limit. No copay    Authorization - Visit Number 4    Authorization - Number of Visits 70    OT Start Time 1115    OT Stop Time 1155    OT Time Calculation (min) 40 min    Activity Tolerance Patient tolerated treatment well    Behavior During Therapy WFL for tasks assessed/performed           Past Medical History:  Diagnosis Date  . Constipation   . GERD (gastroesophageal reflux disease)   . PONV (postoperative nausea and vomiting)   . Ulcer of esophagus     Past Surgical History:  Procedure Laterality Date  . CERVICAL CONIZATION W/BX  1983  . COLONOSCOPY  06/27/2011   Procedure: COLONOSCOPY;  Surgeon: Rogene Houston, MD;  [facility];  Service: Endoscopy;  Laterality: N/A;  12:00  . ESOPHAGOGASTRODUODENOSCOPY N/A 08/10/2015   Procedure: ESOPHAGOGASTRODUODENOSCOPY (EGD);  Surgeon: Rogene Houston, MD;  [facility];  Service: Endoscopy;  Laterality: N/A;  1:00  . FLEXOR TENOTOMY Right 05/09/2020   Procedure: Tenotomy flexor tendons right hand;  Surgeon: Carole Civil, MD;  Location: [facility];  Service: Orthopedics;  Laterality: Right;  . OPEN REDUCTION INTERNAL FIXATION (ORIF) DISTAL RADIAL FRACTURE Right 03/03/2020   Procedure: OPEN REDUCTION INTERNAL FIXATION (ORIF) RIGHT WRIST FRACTURE;  Surgeon: Carole Civil, MD;  Location: [facility];  Service: Orthopedics;  Laterality: Right;    There were no vitals filed  for this visit.   Subjective Assessment - 05/31/20 1603    Subjective  S: My hand felt so much better after we worked on it Monday!    Currently in Pain? Yes    Pain Score 2     Pain Location Hand    Pain Orientation Right    Pain Descriptors / Indicators Aching              [facility] OT Assessment - 05/31/20 0001      Assessment   Medical Diagnosis Tenotomy Flexor tendons    Referring Provider (OT) Arther Abbott, MD    Onset Date/Surgical Date 05/09/20      Precautions   Precautions Other (comment)    Precaution Comments No active finger flexion. Only extension. Remain in splint at all times.    Required Braces or Orthoses Other Brace/Splint    Other Brace/Splint Dorsal blocking orthosis (made today)      Restrictions   Weight Bearing Restrictions Yes    RUE Weight Bearing Non weight bearing                    OT Treatments/Exercises (OP) - 05/31/20 0001      Exercises   Exercises Wrist;Hand      Wrist Exercises   Wrist Flexion PROM;10 reps    Wrist Extension PROM;10 reps    Wrist Radial Deviation PROM;10 reps    Wrist  Ulnar Deviation PROM;10 reps    Other wrist exercises supination/pronation PROM 10 times       Hand Exercises   Other Hand Exercises P/ROM all digits, joints 5 times each within constraints of splint, extension of each MCPJ to constraints of splint       Splinting   Splinting educated patient on use of flexion glove combined with dorsal blocking splint to passively flex digits and allow for active extension when glove released.  patient educated on wear/care/contraindications       Manual Therapy   Manual Therapy Myofascial release;Other (comment)   debriding   Manual therapy comments manual therapy completed seperately from all other interventions    Myofascial Release myofascial release and manual stretching to decrease pain and restrictions and improve pain free mobility in right hand, wrist, and forearm     Other Manual Therapy  debrided surgical incision at proximal end                   OT Education - 05/31/20 1603    Education Details educated patient on use of flexion glove with splint for passive flexion to be compelted 15-30 min every 1-2 hours.    Person(s) Educated Patient    Methods Explanation    Comprehension Verbalized understanding            OT Short Term Goals - 05/30/20 7902      OT SHORT TERM GOAL #1   Title Patient will be educated and independent with HEP in order to safely mobilize her right hand in order to decrease risk of contracture and increase functional ROM.    Time 4    Period Weeks    Status New    Target Date 06/27/20      OT SHORT TERM GOAL #2   Title Patient will be provided a fabricated right hand dynamic splint in order to provided joint protection during healing phase of procedure while allowing for appropriate ROM.    Time 4    Period Weeks    Status New      OT SHORT TERM GOAL #3   Title Patient will improve RUE forearm, wrist, and hand P/ROM to WNL for improved functional use of RUE.    Time 4    Period Weeks    Status New      OT SHORT TERM GOAL #4   Title Patient will decrease pain to 3/10 or better in RUE.    Time 4    Period Weeks    Status New             OT Long Term Goals - 05/30/20 4097      OT LONG TERM GOAL #1   Title Patient will return to prior level of function using her RUE as dominant with daily tasks.    Time 8    Period Weeks    Status New    Target Date 07/25/20      OT LONG TERM GOAL #2   Title Patient will improve RUE a/rom to [facility] for improved ability to write, drive, and complete desired daily tasks.    Time 8    Period Weeks    Status New      OT LONG TERM GOAL #3   Title Patient will improve RUE strength to 4/5 or better for improved ability to complete tasks such as picking up bags of groceries.    Time 8    Period Weeks  Status New      OT LONG TERM GOAL #4   Title Patient will improve RUE grip and  pinch strength to [facility] in order to open up jars and containers.    Time 8    Period Weeks    Status New      OT LONG TERM GOAL #5   Title Patient will demonstrate WFL fine motor coordination in her right hand for improved ability to fasten jewelery and write with her dominant right hand.    Time 8    Period Weeks    Status New                 Plan - 05/31/20 1604    Clinical Impression Statement A:  patient able to don flexion glove to provide passive flexion to her right digits, with dorsal blocking splint she is able to extend within constraints of the splint.  demonstrated independence with this activity.  debrided incision at proximal end this date.  less cupping of thenar and hypothenar eminence this date. Patient has [facility] digit extension within constraints of splint.    Occupational performance deficits (Please refer to evaluation for details): ADL's;IADL's;Rest and Sleep;Leisure    Body Structure / Function / Physical Skills ADL;UE functional use;Fascial restriction;Pain;FMC;ROM;GMC;Coordination;IADL;Strength;Edema    Plan P:  follow up on splint and flexion glove use.  continue manual therapy  and passive range of motion to return forearm, wrist, hand to [facility] for adl completion.           Patient will benefit from skilled therapeutic intervention in order to improve the following deficits and impairments:   Body Structure / Function / Physical Skills: ADL, UE functional use, Fascial restriction, Pain, FMC, ROM, GMC, Coordination, IADL, Strength, Edema       Visit Diagnosis: Stiffness of right hand, not elsewhere classified  Other lack of coordination  Pain in right hand  Other symptoms and signs involving the musculoskeletal system    Problem List Patient Active Problem List   Diagnosis Date Noted  . S/P carpal tunnel release and right hand flexor tenodesis multiple on 05/09/20 05/09/2020  . Contracture of joint of right forearm   . Complex regional pain  syndrome i of right lower limb 04/28/2020  . S/P ORIF (open reduction internal fixation) fracture right wrist 03/03/20 03/07/2020  . Closed Barton's fracture of right radius   . Kyphosis of thoracic region 10/03/2017  . Erosive esophagitis 03/08/2013  . Unspecified constipation 03/08/2013    Vangie Bicker, [place], OTR/L [phone]  05/31/2020, 4:12 PM  [place] [address] [place], [place], [address] Phone: [phone]   Fax:  [phone]  Name: Madison Walsh MRN: [id number] Date of Birth: 1961-07-06

## 2020-06-06 ENCOUNTER — Ambulatory Visit (HOSPITAL_COMMUNITY): Payer: 59 | Admitting: Specialist

## 2020-06-06 ENCOUNTER — Other Ambulatory Visit: Payer: Self-pay

## 2020-06-06 DIAGNOSIS — M79641 Pain in right hand: Secondary | ICD-10-CM

## 2020-06-06 DIAGNOSIS — M25641 Stiffness of right hand, not elsewhere classified: Secondary | ICD-10-CM

## 2020-06-06 DIAGNOSIS — R278 Other lack of coordination: Secondary | ICD-10-CM

## 2020-06-06 DIAGNOSIS — R29898 Other symptoms and signs involving the musculoskeletal system: Secondary | ICD-10-CM | POA: Diagnosis not present

## 2020-06-07 ENCOUNTER — Encounter (HOSPITAL_COMMUNITY): Payer: Self-pay | Admitting: Specialist

## 2020-06-07 NOTE — Therapy (Signed)
[place], [place], [address] Phone: [phone]   Fax:  [phone]  Occupational Therapy Treatment  Patient Details  Name: Madison Walsh MRN: [id number] Date of Birth: November 04, 1960 Referring Provider (OT): Arther Abbott, MD   Encounter Date: 06/06/2020   OT End of Session - 06/07/20 0856    Visit Number 5    Number of Visits 18    Date for OT Re-Evaluation 07/24/20   mini reassess on 10/18   Authorization Type [employer]    Authorization Time Period 60 visit limit. No copay    Authorization - Visit Number 5    Authorization - Number of Visits 60    OT Start Time 1430    OT Stop Time 1508    OT Time Calculation (min) 38 min    Activity Tolerance Patient tolerated treatment well    Behavior During Therapy WFL for tasks assessed/performed           Past Medical History:  Diagnosis Date   Constipation    GERD (gastroesophageal reflux disease)    PONV (postoperative nausea and vomiting)    Ulcer of esophagus     Past Surgical History:  Procedure Laterality Date   CERVICAL CONIZATION W/BX  1983   COLONOSCOPY  06/27/2011   Procedure: COLONOSCOPY;  Surgeon: Rogene Houston, MD;  [facility];  Service: Endoscopy;  Laterality: N/A;  12:00   ESOPHAGOGASTRODUODENOSCOPY N/A 08/10/2015   Procedure: ESOPHAGOGASTRODUODENOSCOPY (EGD);  Surgeon: Rogene Houston, MD;  [facility];  Service: Endoscopy;  Laterality: N/A;  1:00   FLEXOR TENOTOMY Right 05/09/2020   Procedure: Tenotomy flexor tendons right hand;  Surgeon: Carole Civil, MD;  Location: [facility];  Service: Orthopedics;  Laterality: Right;   OPEN REDUCTION INTERNAL FIXATION (ORIF) DISTAL RADIAL FRACTURE Right 03/03/2020   Procedure: OPEN REDUCTION INTERNAL FIXATION (ORIF) RIGHT WRIST FRACTURE;  Surgeon: Carole Civil, MD;  Location: [facility];  Service: Orthopedics;  Laterality: Right;    There were no vitals filed  for this visit.   Subjective Assessment - 06/07/20 0855    Subjective  S:  I can really bend my fingers down now!    Currently in Pain? Yes    Pain Score 2     Pain Location Hand    Pain Orientation Right    Pain Descriptors / Indicators Aching              [facility] OT Assessment - 06/07/20 0001      Assessment   Medical Diagnosis Tenotomy Flexor tendons    Referring Provider (OT) Arther Abbott, MD    Onset Date/Surgical Date 05/09/20      Precautions   Precautions Other (comment)    Precaution Comments No active finger flexion. Only extension. Remain in splint at all times.    Required Braces or Orthoses Other Brace/Splint    Other Brace/Splint Dorsal blocking orthosis with dynamic flexion glove       Restrictions   Weight Bearing Restrictions Yes    RUE Weight Bearing Non weight bearing      Palpation   Palpation comment mod fascial restrictions and scar restrictions in RUE       PROM   PROM Assessment Site Forearm;Wrist    Right/Left Forearm Right    Right Forearm Pronation 40 Degrees   40   Right Forearm Supination 65 Degrees   65   Right/Left Wrist Right    Right  Wrist Extension 30 Degrees   12   Right Wrist Flexion 40 Degrees   22     Strength   Overall Strength Comments not assessed due to recent surgery       Right Hand AROM   R Index  MCP 0-90 --   extension lacks 32 degrees at IF, LF, RF, SF MCPF     Right Hand PROM   R Thumb MCP 0-60 58 Degrees   50   R Thumb IP 0-80 40 Degrees   40   R Index  MCP 0-90 72 Degrees   70   R Index PIP 0-100 80 Degrees   72   R Index DIP 0-70 52 Degrees   38   R Long  MCP 0-90 82 Degrees   66   R Long PIP 0-100 84 Degrees   70   R Long DIP 0-70 50 Degrees   20   R Ring  MCP 0-90 80 Degrees   68   R Ring PIP 0-100 90 Degrees   70   R Ring DIP 0-70 60 Degrees   60   R Little  MCP 0-90 70 Degrees   62   R Little PIP 0-100 90 Degrees   70   R Little DIP 0-70 60 Degrees   50                   OT  Treatments/Exercises (OP) - 06/07/20 0001      Exercises   Exercises Wrist;Hand      Wrist Exercises   Wrist Flexion PROM;10 reps    Wrist Extension PROM;10 reps    Wrist Radial Deviation PROM;10 reps    Wrist Ulnar Deviation PROM;10 reps    Other wrist exercises supination/pronation PROM 10 times       Hand Exercises   Other Hand Exercises P/ROM all digits, joints 5 times each within constraints of splint, extension of each MCPJ to constraints of splint       Manual Therapy   Manual Therapy Myofascial release;Other (comment)    Manual therapy comments manual therapy completed seperately from all other interventions    Myofascial Release myofascial release and manual stretching to decrease pain and restrictions and improve pain free mobility in right hand, wrist, and forearm                   OT Education - 06/07/20 0856    Education Details continued use of flexion glove/splint    Person(s) Educated Patient    Methods Explanation    Comprehension Verbalized understanding            OT Short Term Goals - 06/07/20 0858      OT SHORT TERM GOAL #1   Title Patient will be educated and independent with HEP in order to safely mobilize her right hand in order to decrease risk of contracture and increase functional ROM.    Time 4    Period Weeks    Status On-going    Target Date 06/27/20      OT SHORT TERM GOAL #2   Title Patient will be provided a fabricated right hand dynamic splint in order to provided joint protection during healing phase of procedure while allowing for appropriate ROM.    Time 4    Period Weeks    Status Achieved      OT SHORT TERM GOAL #3   Title Patient will improve RUE forearm, wrist, and hand P/ROM  to WNL for improved functional use of RUE.    Time 4    Period Weeks    Status On-going      OT SHORT TERM GOAL #4   Title Patient will decrease pain to 3/10 or better in RUE.    Time 4    Period Weeks    Status Achieved              OT Long Term Goals - 06/07/20 0859      OT LONG TERM GOAL #1   Title Patient will return to prior level of function using her RUE as dominant with daily tasks.    Time 8    Period Weeks    Status On-going      OT LONG TERM GOAL #2   Title Patient will improve RUE a/rom to [facility] for improved ability to write, drive, and complete desired daily tasks.    Time 8    Period Weeks    Status On-going      OT LONG TERM GOAL #3   Title Patient will improve RUE strength to 4/5 or better for improved ability to complete tasks such as picking up bags of groceries.    Time 8    Period Weeks    Status On-going      OT LONG TERM GOAL #4   Title Patient will improve RUE grip and pinch strength to [facility] in order to open up jars and containers.    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    Time 8    Period Weeks    Status On-going                 Plan - 06/07/20 0857    Clinical Impression Statement A:  Patient has made significant improvements in P/ROM from assessment 1 week ago.  Patient able to passively flex digits to [facility].  Wrist flexion, extension, supination, pronation have improved however continue to have significant limitations.    Body Structure / Function / Physical Skills ADL;UE functional use;Fascial restriction;Pain;FMC;ROM;GMC;Coordination;IADL;Strength;Edema    Plan P:  Continue to improve P/ROM of RUE, progress as MD allows to aa/rom and a/rom for improved use of RUE with functional tasks.           Patient will benefit from skilled therapeutic intervention in order to improve the following deficits and impairments:   Body Structure / Function / Physical Skills: ADL, UE functional use, Fascial restriction, Pain, FMC, ROM, GMC, Coordination, IADL, Strength, Edema       Visit  Diagnosis: Stiffness of right hand, not elsewhere classified  Other lack of coordination  Pain in right hand  Other symptoms and signs involving the musculoskeletal system    Problem List Patient Active Problem List   Diagnosis Date Noted   S/P carpal tunnel release and right hand flexor tenodesis multiple on 05/09/20 05/09/2020   Contracture of joint of right forearm    Complex regional pain syndrome i of right lower limb 04/28/2020   S/P ORIF (open reduction internal fixation) fracture right wrist 03/03/20 03/07/2020   Closed Barton's fracture of right radius    Kyphosis of thoracic region 10/03/2017   Erosive esophagitis 03/08/2013   Unspecified constipation 03/08/2013  Vangie Bicker, [place], OTR/L [phone]  06/07/2020, 9:07 AM  [employer] [address] [place], [place], [address] Phone: [phone]   Fax:  [phone]  Name: NEDDA GAINS MRN: [id number] Date of Birth: 10-08-1960

## 2020-06-08 ENCOUNTER — Other Ambulatory Visit: Payer: Self-pay

## 2020-06-08 ENCOUNTER — Ambulatory Visit (INDEPENDENT_AMBULATORY_CARE_PROVIDER_SITE_OTHER): Payer: 59 | Admitting: Orthopedic Surgery

## 2020-06-08 ENCOUNTER — Encounter: Payer: Self-pay | Admitting: Orthopedic Surgery

## 2020-06-08 DIAGNOSIS — S52561D Barton's fracture of right radius, subsequent encounter for closed fracture with routine healing: Secondary | ICD-10-CM

## 2020-06-08 DIAGNOSIS — Z8781 Personal history of (healed) traumatic fracture: Secondary | ICD-10-CM

## 2020-06-08 DIAGNOSIS — Z9889 Other specified postprocedural states: Secondary | ICD-10-CM

## 2020-06-08 NOTE — Progress Notes (Signed)
Chief Complaint  Patient presents with  . Routine Post Op    right hand 05/09/20    Original ORIF on March 03, 2020 Volar Barton's fracture dislocation treated with volar plate  Second procedure carpal tunnel release and release of flexor tendons Surgery date May 09, 2020  Patient is now in a flexion splint for passive extension and active flexion.  She is made excellent gains in her flexion of the index long ring and small finger the thumb is still very tight  She also has prominence of the ulna but that is from residual deformity after the reduction.  She had side effects from the gabapentin had to stop it but has had no increased numbness or tingling after she still on clonidine for the complex regional pain syndrome  She also has some shoulder pain and stiffness in the right shoulder  Recommend come back in 2 weeks continue the splinting goal is for her to return to work mid November

## 2020-06-09 ENCOUNTER — Ambulatory Visit (HOSPITAL_COMMUNITY): Payer: 59 | Attending: Orthopedic Surgery | Admitting: Specialist

## 2020-06-09 ENCOUNTER — Encounter (HOSPITAL_COMMUNITY): Payer: Self-pay | Admitting: Specialist

## 2020-06-09 DIAGNOSIS — R278 Other lack of coordination: Secondary | ICD-10-CM | POA: Diagnosis present

## 2020-06-09 DIAGNOSIS — R29898 Other symptoms and signs involving the musculoskeletal system: Secondary | ICD-10-CM | POA: Insufficient documentation

## 2020-06-09 DIAGNOSIS — M79641 Pain in right hand: Secondary | ICD-10-CM | POA: Diagnosis present

## 2020-06-09 DIAGNOSIS — M25641 Stiffness of right hand, not elsewhere classified: Secondary | ICD-10-CM | POA: Diagnosis not present

## 2020-06-09 NOTE — Therapy (Signed)
[place], [place], [address] Phone: [phone]   Fax:  [phone]  Occupational Therapy Treatment  Patient Details  Name: Madison Walsh MRN: [id number] Date of Birth: 04-10-1961 Referring Provider (OT): Arther Abbott, MD   Encounter Date: 06/09/2020   OT End of Session - 06/09/20 1507    Visit Number 6    Date for OT Re-Evaluation 07/24/20   mini reassess on 10/18   Authorization Type [employer]    Authorization Time Period 60 visit limit. No copay    Authorization - Visit Number 6    Authorization - Number of Visits 67    OT Start Time 1115    OT Stop Time 1200    OT Time Calculation (min) 45 min    Activity Tolerance Patient tolerated treatment well    Behavior During Therapy WFL for tasks assessed/performed           Past Medical History:  Diagnosis Date  . Constipation   . GERD (gastroesophageal reflux disease)   . PONV (postoperative nausea and vomiting)   . Ulcer of esophagus     Past Surgical History:  Procedure Laterality Date  . CERVICAL CONIZATION W/BX  1983  . COLONOSCOPY  06/27/2011   Procedure: COLONOSCOPY;  Surgeon: Rogene Houston, MD;  [facility];  Service: Endoscopy;  Laterality: N/A;  12:00  . ESOPHAGOGASTRODUODENOSCOPY N/A 08/10/2015   Procedure: ESOPHAGOGASTRODUODENOSCOPY (EGD);  Surgeon: Rogene Houston, MD;  [facility];  Service: Endoscopy;  Laterality: N/A;  1:00  . FLEXOR TENOTOMY Right 05/09/2020   Procedure: Tenotomy flexor tendons right hand;  Surgeon: Carole Civil, MD;  Location: [facility];  Service: Orthopedics;  Laterality: Right;  . OPEN REDUCTION INTERNAL FIXATION (ORIF) DISTAL RADIAL FRACTURE Right 03/03/2020   Procedure: OPEN REDUCTION INTERNAL FIXATION (ORIF) RIGHT WRIST FRACTURE;  Surgeon: Carole Civil, MD;  Location: [facility];  Service: Orthopedics;  Laterality: Right;    There were no vitals filed for this visit.    Subjective Assessment - 06/09/20 1506    Subjective  S:  He was pleased with my progress    Currently in Pain? Yes    Pain Score 3     Pain Location Hand              [facility] OT Assessment - 06/09/20 0001      Assessment   Medical Diagnosis Tenotomy Flexor tendons    Referring Provider (OT) Arther Abbott, MD    Onset Date/Surgical Date 05/09/20    Next MD Visit 06/22/20      Precautions   Precautions Other (comment)    Precaution Comments splint 2 additional weeks ok to do active finger flexion outside of splint at therapy    Required Braces or Orthoses Other Brace/Splint    Other Brace/Splint Dorsal blocking orthosis with dynamic flexion glove       Restrictions   Weight Bearing Restrictions Yes    RUE Weight Bearing Non weight bearing                    OT Treatments/Exercises (OP) - 06/09/20 0001      Exercises   Exercises Wrist;Hand      Wrist Exercises   Wrist Flexion PROM;AROM;10 reps    Wrist Extension PROM;AROM;10 reps    Wrist Radial Deviation PROM;AROM;10 reps    Wrist Ulnar Deviation PROM;AROM;10 reps    Other wrist exercises supination/pronation PROM and  AROM 10 times       Additional Wrist Exercises   Sponges to promote digit mobility, wrist extension and forearm pronation:  5, 6, 6       Hand Exercises   Joint Blocking Exercises all digits and joints p/arom 5 times each     Opposition PROM;AROM;10 reps   can oppose to IF [place], to [facility] with assist     Manual Therapy   Manual Therapy Myofascial release;Other (comment)    Manual therapy comments manual therapy completed seperately from all other interventions    Myofascial Release myofascial release and manual stretching to decrease pain and restrictions and improve pain free mobility in right hand, wrist, and forearm       Fine Motor Coordination (Hand/Wrist)   Fine Motor Coordination Manipulation of small objects    Manipulation of small objects picked up long and short shotgun shells one  at a time and placed in container vertically with min difficulty and increased time to complete                  OT Education - 06/09/20 1506    Education Details active digit flexion and extension exercises within constraints of splint    Person(s) Educated Patient    Methods Explanation;Demonstration;Handout    Comprehension Verbalized understanding;Returned demonstration            OT Short Term Goals - 06/07/20 0858      OT SHORT TERM GOAL #1   Title Patient will be educated and independent with HEP in order to safely mobilize her right hand in order to decrease risk of contracture and increase functional ROM.    Time 4    Period Weeks    Status On-going    Target Date 06/27/20      OT SHORT TERM GOAL #2   Title Patient will be provided a fabricated right hand dynamic splint in order to provided joint protection during healing phase of procedure while allowing for appropriate ROM.    Time 4    Period Weeks    Status Achieved      OT SHORT TERM GOAL #3   Title Patient will improve RUE forearm, wrist, and hand P/ROM to WNL for improved functional use of RUE.    Time 4    Period Weeks    Status On-going      OT SHORT TERM GOAL #4   Title Patient will decrease pain to 3/10 or better in RUE.    Time 4    Period Weeks    Status Achieved             OT Long Term Goals - 06/07/20 0859      OT LONG TERM GOAL #1   Title Patient will return to prior level of function using her RUE as dominant with daily tasks.    Time 8    Period Weeks    Status On-going      OT LONG TERM GOAL #2   Title Patient will improve RUE a/rom to [facility] for improved ability to write, drive, and complete desired daily tasks.    Time 8    Period Weeks    Status On-going      OT LONG TERM GOAL #3   Title Patient will improve RUE strength to 4/5 or better for improved ability to complete tasks such as picking up bags of groceries.    Time 8    Period Weeks    Status  On-going      OT  LONG TERM GOAL #4   Title Patient will improve RUE grip and pinch strength to [facility] in order to open up jars and containers.    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    Time 8    Period Weeks    Status On-going                 Plan - 06/09/20 1507    Clinical Impression Statement A:  Patient is making significant progress with digit mobility.  opposition is difficult due to decreased thumb IPJ.  treatment focused on functional reach and grasp activities to promote improved pronation, wrist extension, and digit mobility    Body Structure / Function / Physical Skills ADL;UE functional use;Fascial restriction;Pain;FMC;ROM;GMC;Coordination;IADL;Strength;Edema    Plan P:  Continue to improve active and passive ROM of RUE within constraints of splint.           Patient will benefit from skilled therapeutic intervention in order to improve the following deficits and impairments:   Body Structure / Function / Physical Skills: ADL, UE functional use, Fascial restriction, Pain, FMC, ROM, GMC, Coordination, IADL, Strength, Edema       Visit Diagnosis: Stiffness of right hand, not elsewhere classified  Other lack of coordination  Pain in right hand  Other symptoms and signs involving the musculoskeletal system    Problem List Patient Active Problem List   Diagnosis Date Noted  . S/P carpal tunnel release and right hand flexor tenodesis multiple on 05/09/20 05/09/2020  . Contracture of joint of right forearm   . Complex regional pain syndrome i of right lower limb 04/28/2020  . S/P ORIF (open reduction internal fixation) fracture right wrist 03/03/20 03/07/2020  . Closed Barton's fracture of right radius   . Kyphosis of thoracic region 10/03/2017  . Erosive esophagitis 03/08/2013  .  Unspecified constipation 03/08/2013    Vangie Bicker, [place], OTR/L [phone]  06/09/2020, 3:14 PM  [place] [address] [place], [place], [address] Phone: [phone]   Fax:  [phone]  Name: ALIVIYAH MALANGA MRN: [id number] Date of Birth: 06/11/1961

## 2020-06-09 NOTE — Patient Instructions (Signed)
AROM: DIP Flexion / Extension   Pinch middle knuckle of ________ finger of right hand to prevent bending. Bend end knuckle until stretch is felt. Hold ____ seconds. Relax. Straighten finger as far as possible. Repeat ____ times per set. Do ____ sets per session. Do ____ sessions per day.  Copyright  VHI. All rights reserved.    AROM: PIP Flexion / Extension   Pinch bottom knuckle of ________ finger of right hand to prevent bending. Actively bend middle knuckle until stretch is felt. Hold ____ seconds. Relax. Straighten finger as far as possible. Repeat ____ times per set. Do ____ sets per session. Do ____ sessions per day.  Copyright  VHI. All rights reserved.   AROM: Finger Flexion / Extension   Actively bend fingers of right hand. Start with knuckles furthest from palm, and slowly make a fist. Hold ____ seconds. Relax. Then straighten fingers as far as possible. Repeat ____ times per set. Do ____ sets per session. Do ____ sessions per day.  Copyright  VHI. All rights reserved.   Paper Crumpling Exercise   Begin with right palm down on piece of paper. Maintaining contact between surface and heel of hand, crumple paper into a ball. Repeat ____ times per set. Do ____ sets per session. Do ____ sessions per day.  Copyright  VHI. All rights reserved.     Towel Roll Squeeze   With right forearm resting on surface, gently squeeze towel. Repeat ____ times per set. Do ____ sets per session. Do ____ sessions per day.  Copyright  VHI. All rights reserved.   Abduction / Adduction (Active)    With hand flat on table, spread all fingers apart, then bring them together as close as possible. Repeat ____ times. Do ____ sessions per day.  Copyright  VHI. All rights reserved.  AROM: Thumb Abduction / Adduction   Actively pull right thumb away from palm as far as possible. Hold ____ seconds. Then bring thumb back to touch fingers. Try not to bend fingers toward thumb. Repeat  ____ times per set. Do ____ sets per session. Do ____ sessions per day.  Copyright  VHI. All rights reserved.  Opposition (Active)    Touch tip of thumb to nail tip of each finger in turn, making an "O" shape. Repeat ____ times. Do ____ sessions per day.  Copyright  VHI. All rights reserved.  Fine Motor Coordination Exercises  Perform the following exercises 2 times a day, as recommended by your occupational therapist.   Close all fingers and thumb into a tight fist and then open wide. (10 times)  Pick up 5 small objects (coins, marbles, paperclips, beads, etc.) one at a time and hold them in hand, then place them one by one onto the table.  Pick up small objects and place them into a cup or container.  Stack approximately [employer] (checkers, coins, etc.) onto table.

## 2020-06-14 ENCOUNTER — Other Ambulatory Visit: Payer: Self-pay

## 2020-06-14 ENCOUNTER — Encounter (HOSPITAL_COMMUNITY): Payer: Self-pay | Admitting: Specialist

## 2020-06-14 ENCOUNTER — Ambulatory Visit (HOSPITAL_COMMUNITY): Payer: 59 | Admitting: Specialist

## 2020-06-14 DIAGNOSIS — R278 Other lack of coordination: Secondary | ICD-10-CM

## 2020-06-14 DIAGNOSIS — M25641 Stiffness of right hand, not elsewhere classified: Secondary | ICD-10-CM | POA: Diagnosis not present

## 2020-06-14 DIAGNOSIS — R29898 Other symptoms and signs involving the musculoskeletal system: Secondary | ICD-10-CM

## 2020-06-14 DIAGNOSIS — M79641 Pain in right hand: Secondary | ICD-10-CM

## 2020-06-14 NOTE — Therapy (Signed)
[place], [place], [address] Phone: [phone]   Fax:  [phone]  Occupational Therapy Treatment  Patient Details  Name: Madison Walsh MRN: [id number] Date of Birth: Oct 05, 1960 Referring Provider (OT): Arther Abbott, MD   Encounter Date: 06/14/2020   OT End of Session - 06/14/20 1401    Visit Number 7    Number of Visits 18    Date for OT Re-Evaluation 07/24/20   mini reassess on 10/18   Authorization Type [employer]    Authorization Time Period 60 visit limit. No copay    Authorization - Visit Number 7    Authorization - Number of Visits 71    OT Start Time 1125    OT Stop Time 1205    OT Time Calculation (min) 40 min    Activity Tolerance Patient tolerated treatment well    Behavior During Therapy WFL for tasks assessed/performed           Past Medical History:  Diagnosis Date  . Constipation   . GERD (gastroesophageal reflux disease)   . PONV (postoperative nausea and vomiting)   . Ulcer of esophagus     Past Surgical History:  Procedure Laterality Date  . CERVICAL CONIZATION W/BX  1983  . COLONOSCOPY  06/27/2011   Procedure: COLONOSCOPY;  Surgeon: Rogene Houston, MD;  [facility];  Service: Endoscopy;  Laterality: N/A;  12:00  . ESOPHAGOGASTRODUODENOSCOPY N/A 08/10/2015   Procedure: ESOPHAGOGASTRODUODENOSCOPY (EGD);  Surgeon: Rogene Houston, MD;  [facility]: [facility];  Service: Endoscopy;  Laterality: N/A;  1:00  . FLEXOR TENOTOMY Right 05/09/2020   Procedure: Tenotomy flexor tendons right hand;  Surgeon: Carole Civil, MD;  Location: [facility];  Service: Orthopedics;  Laterality: Right;  . OPEN REDUCTION INTERNAL FIXATION (ORIF) DISTAL RADIAL FRACTURE Right 03/03/2020   Procedure: OPEN REDUCTION INTERNAL FIXATION (ORIF) RIGHT WRIST FRACTURE;  Surgeon: Carole Civil, MD;  Location: [facility];  Service: Orthopedics;  Laterality: Right;    There were no vitals filed  for this visit.   Subjective Assessment - 06/14/20 1400    Subjective  S:  I had a really good day Monday and then yesterday my hand seemed more swollen.    Currently in Pain? Yes    Pain Score 3     Pain Location Wrist    Pain Orientation Right    Pain Descriptors / Indicators Aching              [facility] OT Assessment - 06/14/20 0001      Assessment   Medical Diagnosis Tenotomy Flexor tendons    Referring Provider (OT) Arther Abbott, MD    Onset Date/Surgical Date 05/09/20    Hand Dominance Right    Next MD Visit 06/22/20      Precautions   Precautions Other (comment)    Precaution Comments splint 2 additional weeks ok to do active finger flexion outside of splint at therapy                    OT Treatments/Exercises (OP) - 06/14/20 0001      Exercises   Exercises Wrist;Hand      Wrist Exercises   Wrist Flexion PROM;AROM;10 reps    Wrist Extension PROM;AROM;10 reps    Wrist Radial Deviation PROM;AROM;10 reps    Wrist Ulnar Deviation PROM;AROM;10 reps    Other wrist exercises supination/pronation PROM and AROM 10 times  Additional Wrist Exercises   Sponges to promote digit mobility, wrist extension and forearm pronation:  8 x 3      Hand Exercises   Joint Blocking Exercises all digits and joints p/arom 5 times each     Opposition PROM;AROM;10 reps   able to oppose to small finger this date   Opposition Limitations q      Manual Therapy   Manual Therapy Myofascial release;Other (comment)    Manual therapy comments manual therapy completed seperately from all other interventions    Myofascial Release myofascial release and manual stretching to decrease pain and restrictions and improve pain free mobility in right hand, wrist, and forearm       Fine Motor Coordination (Hand/Wrist)   Fine Motor Coordination Large Pegboard    Large Pegboard able to place 20 large pegs in pegboard with minimal difficlty.  Removing was more challenging, however she  could complete with increased time and was able to hold 2 large pegs at a time in her hand while removing pegs.      Manipulation of small objects able to pick up pennies and place in bank placed on table if pennies were stacked on top of one another.  if  penny flat on table, unable to complete activity.                    OT Education - 06/14/20 1400    Education Details discussed peaks and valleys of progress and to not get discouraged if she does not see improvement every day.    Person(s) Educated Patient    Methods Explanation    Comprehension Verbalized understanding            OT Short Term Goals - 06/07/20 0858      OT SHORT TERM GOAL #1   Title Patient will be educated and independent with HEP in order to safely mobilize her right hand in order to decrease risk of contracture and increase functional ROM.    Time 4    Period Weeks    Status On-going    Target Date 06/27/20      OT SHORT TERM GOAL #2   Title Patient will be provided a fabricated right hand dynamic splint in order to provided joint protection during healing phase of procedure while allowing for appropriate ROM.    Time 4    Period Weeks    Status Achieved      OT SHORT TERM GOAL #3   Title Patient will improve RUE forearm, wrist, and hand P/ROM to WNL for improved functional use of RUE.    Time 4    Period Weeks    Status On-going      OT SHORT TERM GOAL #4   Title Patient will decrease pain to 3/10 or better in RUE.    Time 4    Period Weeks    Status Achieved             OT Long Term Goals - 06/07/20 0859      OT LONG TERM GOAL #1   Title Patient will return to prior level of function using her RUE as dominant with daily tasks.    Time 8    Period Weeks    Status On-going      OT LONG TERM GOAL #2   Title Patient will improve RUE a/rom to [facility] for improved ability to write, drive, and complete desired daily tasks.    Time 8  Period Weeks    Status On-going      OT LONG  TERM GOAL #3   Title Patient will improve RUE strength to 4/5 or better for improved ability to complete tasks such as picking up bags of groceries.    Time 8    Period Weeks    Status On-going      OT LONG TERM GOAL #4   Title Patient will improve RUE grip and pinch strength to [facility] in order to open up jars and containers.    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    Time 8    Period Weeks    Status On-going                 Plan - 06/14/20 1402    Clinical Impression Statement A: continues to improve with active and passive range of motion of forearm, wrist, and hand with constraints of splint.  supination and pronation continue to be most limited movements.  increased to be able to pick up 8 sponges versus previous 6 sponges this date.    Body Structure / Function / Physical Skills ADL;UE functional use;Fascial restriction;Pain;FMC;ROM;GMC;Coordination;IADL;Strength;Edema    Plan P: Continue to improve active and passive ROM of RUE, complete sustained stretch for supination and pronation.           Patient will benefit from skilled therapeutic intervention in order to improve the following deficits and impairments:   Body Structure / Function / Physical Skills: ADL, UE functional use, Fascial restriction, Pain, FMC, ROM, GMC, Coordination, IADL, Strength, Edema       Visit Diagnosis: Stiffness of right hand, not elsewhere classified  Other lack of coordination  Pain in right hand  Other symptoms and signs involving the musculoskeletal system    Problem List Patient Active Problem List   Diagnosis Date Noted  . S/P carpal tunnel release and right hand flexor tenodesis multiple on 05/09/20 05/09/2020  . Contracture of joint of right forearm   . Complex regional pain syndrome  i of right lower limb 04/28/2020  . S/P ORIF (open reduction internal fixation) fracture right wrist 03/03/20 03/07/2020  . Closed Barton's fracture of right radius   . Kyphosis of thoracic region 10/03/2017  . Erosive esophagitis 03/08/2013  . Unspecified constipation 03/08/2013    Vangie Bicker, [place], OTR/L [phone]  06/14/2020, 2:29 PM  [place] [address] [place], [place], [address] Phone: [phone]   Fax:  [phone]  Name: Madison Walsh MRN: [id number] Date of Birth: May 18, 1961

## 2020-06-16 ENCOUNTER — Ambulatory Visit (HOSPITAL_COMMUNITY): Payer: 59 | Admitting: Specialist

## 2020-06-16 ENCOUNTER — Other Ambulatory Visit: Payer: Self-pay

## 2020-06-16 ENCOUNTER — Encounter (HOSPITAL_COMMUNITY): Payer: Self-pay | Admitting: Specialist

## 2020-06-16 DIAGNOSIS — R278 Other lack of coordination: Secondary | ICD-10-CM

## 2020-06-16 DIAGNOSIS — M25641 Stiffness of right hand, not elsewhere classified: Secondary | ICD-10-CM | POA: Diagnosis not present

## 2020-06-16 DIAGNOSIS — M79641 Pain in right hand: Secondary | ICD-10-CM

## 2020-06-16 DIAGNOSIS — R29898 Other symptoms and signs involving the musculoskeletal system: Secondary | ICD-10-CM

## 2020-06-16 NOTE — Therapy (Signed)
[place], [place], [address] Phone: [phone]   Fax:  [phone]  Occupational Therapy Treatment  Patient Details  Name: Madison Walsh MRN: [id number] Date of Birth: [birth year] Referring Provider (OT): Arther Abbott, MD   Encounter Date: 06/16/2020   OT End of Session - 06/16/20 1556    Visit Number 8    Number of Visits 18    Date for OT Re-Evaluation 07/24/20    Authorization Type [employer]    Authorization Time Period 60 visit limit. No copay    Authorization - Visit Number 8    Authorization - Number of Visits 60    OT Start Time 1120    OT Stop Time 1200    OT Time Calculation (min) 40 min    Activity Tolerance Patient tolerated treatment well    Behavior During Therapy WFL for tasks assessed/performed           Past Medical History:  Diagnosis Date  . Constipation   . GERD (gastroesophageal reflux disease)   . PONV (postoperative nausea and vomiting)   . Ulcer of esophagus     Past Surgical History:  Procedure Laterality Date  . CERVICAL CONIZATION W/BX  1983  . COLONOSCOPY  06/27/2011   Procedure: COLONOSCOPY;  Surgeon: Rogene Houston, MD;  [facility];  Service: Endoscopy;  Laterality: N/A;  12:00  . ESOPHAGOGASTRODUODENOSCOPY N/A 08/10/2015   Procedure: ESOPHAGOGASTRODUODENOSCOPY (EGD);  Surgeon: Rogene Houston, MD;  [facility];  Service: Endoscopy;  Laterality: N/A;  1:00  . FLEXOR TENOTOMY Right 05/09/2020   Procedure: Tenotomy flexor tendons right hand;  Surgeon: Carole Civil, MD;  Location: [facility];  Service: Orthopedics;  Laterality: Right;  . OPEN REDUCTION INTERNAL FIXATION (ORIF) DISTAL RADIAL FRACTURE Right 03/03/2020   Procedure: OPEN REDUCTION INTERNAL FIXATION (ORIF) RIGHT WRIST FRACTURE;  Surgeon: Carole Civil, MD;  Location: [facility];  Service: Orthopedics;  Laterality: Right;    There were no vitals filed for this visit.    Subjective Assessment - 06/16/20 1555    Subjective  S: I had some nerve pain the last few days .    Currently in Pain? Yes    Pain Score 3     Pain Location Wrist    Pain Orientation Right    Pain Descriptors / Indicators Aching              [facility] OT Assessment - 06/16/20 0001      Assessment   Medical Diagnosis Tenotomy Flexor tendons    Referring Provider (OT) Arther Abbott, MD      Precautions   Precautions Other (comment)    Precaution Comments splint 2 additional weeks ok to do active finger flexion outside of splint at therapy                    OT Treatments/Exercises (OP) - 06/16/20 0001      Exercises   Exercises Wrist;Hand      Wrist Exercises   Wrist Flexion PROM;AROM;10 reps    Wrist Extension PROM;AROM;10 reps    Wrist Radial Deviation PROM;AROM;10 reps    Wrist Ulnar Deviation PROM;AROM;10 reps    Other wrist exercises supination/pronation PROM and AROM 10 times       Additional Wrist Exercises   Sponges to promote digit mobility, wrist extension and forearm pronation:  7, 8, 11      Hand Exercises   Joint  Blocking Exercises all digits and joints p/arom 5 times each     Opposition PROM;AROM;10 reps      Manual Therapy   Manual Therapy Myofascial release;Other (comment)    Manual therapy comments manual therapy completed seperately from all other interventions    Myofascial Release myofascial release and manual stretching to decrease pain and restrictions and improve pain free mobility in right hand, wrist, and forearm       Fine Motor Coordination (Hand/Wrist)   Fine Motor Coordination Large Pegboard    Large Pegboard able to place 20 large pegs in pegboard with minimal difficlty.  Removing was more challenging, however she could complete with increased time and was able to hold 2 large pegs at a time in her hand while removing pegs.      Manipulation of small objects able to pick up pennies and place in bank placed on table if pennies  were stacked on top of one another.  if  penny flat on table able to pick up by sliding off of table with cuing to use wrist extension to pick up coins versus compensatory shoulder movements.                     OT Short Term Goals - 06/07/20 9211      OT SHORT TERM GOAL #1   Title Patient will be educated and independent with HEP in order to safely mobilize her right hand in order to decrease risk of contracture and increase functional ROM.    Time 4    Period Weeks    Status On-going    Target Date 06/27/20      OT SHORT TERM GOAL #2   Title Patient will be provided a fabricated right hand dynamic splint in order to provided joint protection during healing phase of procedure while allowing for appropriate ROM.    Time 4    Period Weeks    Status Achieved      OT SHORT TERM GOAL #3   Title Patient will improve RUE forearm, wrist, and hand P/ROM to WNL for improved functional use of RUE.    Time 4    Period Weeks    Status On-going      OT SHORT TERM GOAL #4   Title Patient will decrease pain to 3/10 or better in RUE.    Time 4    Period Weeks    Status Achieved             OT Long Term Goals - 06/07/20 0859      OT LONG TERM GOAL #1   Title Patient will return to prior level of function using her RUE as dominant with daily tasks.    Time 8    Period Weeks    Status On-going      OT LONG TERM GOAL #2   Title Patient will improve RUE a/rom to [facility] for improved ability to write, drive, and complete desired daily tasks.    Time 8    Period Weeks    Status On-going      OT LONG TERM GOAL #3   Title Patient will improve RUE strength to 4/5 or better for improved ability to complete tasks such as picking up bags of groceries.    Time 8    Period Weeks    Status On-going      OT LONG TERM GOAL #4   Title Patient will improve RUE grip and pinch strength to  WFL in order to open up jars and containers.    Time 8    Period Weeks    Status On-going      OT  LONG TERM GOAL #5   Title Patient will demonstrate WFL fine motor coordination in her right hand for improved ability to fasten jewelery and write with her dominant right hand.    Time 8    Period Weeks    Status On-going                 Plan - 06/16/20 1557    Clinical Impression Statement A:  active and passive range continue to improve, as does in hand mobility.  able to pick up 11 foam squares today.  able to pick up pennies from table by sliding the pennies towards her - verbal guidance needed to extend wrist versus use of compensatory shoulder movements.    Body Structure / Function / Physical Skills ADL;UE functional use;Fascial restriction;Pain;FMC;ROM;GMC;Coordination;IADL;Strength;Edema    Plan P:  continue to improve active and passive rom of RUE, complete sustained streatch for supination and pronation and wrist extension.           Patient will benefit from skilled therapeutic intervention in order to improve the following deficits and impairments:   Body Structure / Function / Physical Skills: ADL, UE functional use, Fascial restriction, Pain, FMC, ROM, GMC, Coordination, IADL, Strength, Edema       Visit Diagnosis: Stiffness of right hand, not elsewhere classified  Other lack of coordination  Pain in right hand  Other symptoms and signs involving the musculoskeletal system    Problem List Patient Active Problem List   Diagnosis Date Noted  . S/P carpal tunnel release and right hand flexor tenodesis multiple on 05/09/20 05/09/2020  . Contracture of joint of right forearm   . Complex regional pain syndrome i of right lower limb 04/28/2020  . S/P ORIF (open reduction internal fixation) fracture right wrist 03/03/20 03/07/2020  . Closed Barton's fracture of right radius   . Kyphosis of thoracic region 10/03/2017  . Erosive esophagitis 03/08/2013  . Unspecified constipation 03/08/2013    Vangie Bicker, [place], OTR/L [phone]  06/16/2020, 4:06  PM  [place] [address] [place], [place], [address] Phone: [phone]   Fax:  [phone]  Name: Madison Walsh MRN: [id number] Date of Birth: [birth year]

## 2020-06-19 ENCOUNTER — Other Ambulatory Visit: Payer: Self-pay

## 2020-06-19 ENCOUNTER — Ambulatory Visit (HOSPITAL_COMMUNITY): Payer: 59 | Admitting: Specialist

## 2020-06-19 ENCOUNTER — Encounter (HOSPITAL_COMMUNITY): Payer: Self-pay | Admitting: Specialist

## 2020-06-19 DIAGNOSIS — M79641 Pain in right hand: Secondary | ICD-10-CM

## 2020-06-19 DIAGNOSIS — R278 Other lack of coordination: Secondary | ICD-10-CM

## 2020-06-19 DIAGNOSIS — R29898 Other symptoms and signs involving the musculoskeletal system: Secondary | ICD-10-CM

## 2020-06-19 DIAGNOSIS — M25641 Stiffness of right hand, not elsewhere classified: Secondary | ICD-10-CM

## 2020-06-19 NOTE — Therapy (Signed)
[facility] [place], [place], [address] Phone: [phone]   Fax:  [phone]  Occupational Therapy Treatment  Patient Details  Name: Madison Walsh MRN: [id number] Date of Birth: 30-Apr-1961 Referring Provider (OT): Arther Abbott, MD   Encounter Date: 06/19/2020   OT End of Session - 06/19/20 1446    Visit Number 9    Number of Visits 18    Date for OT Re-Evaluation 07/24/20    Authorization Type [employer]    Authorization Time Period 60 visit limit. No copay    Authorization - Visit Number 9    Authorization - Number of Visits 81    OT Start Time 7616    OT Stop Time 1202    OT Time Calculation (min) 44 min    Activity Tolerance Patient tolerated treatment well    Behavior During Therapy WFL for tasks assessed/performed           Past Medical History:  Diagnosis Date  . Constipation   . GERD (gastroesophageal reflux disease)   . PONV (postoperative nausea and vomiting)   . Ulcer of esophagus     Past Surgical History:  Procedure Laterality Date  . CERVICAL CONIZATION W/BX  1983  . COLONOSCOPY  06/27/2011   Procedure: COLONOSCOPY;  Surgeon: Rogene Houston, MD;  [facility];  Service: Endoscopy;  Laterality: N/A;  12:00  . ESOPHAGOGASTRODUODENOSCOPY N/A 08/10/2015   Procedure: ESOPHAGOGASTRODUODENOSCOPY (EGD);  Surgeon: Rogene Houston, MD;  [facility];  Service: Endoscopy;  Laterality: N/A;  1:00  . FLEXOR TENOTOMY Right 05/09/2020   Procedure: Tenotomy flexor tendons right hand;  Surgeon: Carole Civil, MD;  Location: [facility];  Service: Orthopedics;  Laterality: Right;  . OPEN REDUCTION INTERNAL FIXATION (ORIF) DISTAL RADIAL FRACTURE Right 03/03/2020   Procedure: OPEN REDUCTION INTERNAL FIXATION (ORIF) RIGHT WRIST FRACTURE;  Surgeon: Carole Civil, MD;  Location: [facility];  Service: Orthopedics;  Laterality: Right;    There were no vitals filed for this visit.    Subjective Assessment - 06/19/20 1444    Subjective  S:  It has felt pretty good today. It isnt as swollen as it was last week.    Currently in Pain? Yes    Pain Score 2     Pain Location Wrist    Pain Orientation Right    Pain Descriptors / Indicators Aching              [facility] OT Assessment - 06/19/20 0001      Assessment   Medical Diagnosis Tenotomy Flexor tendons    Referring Provider (OT) Arther Abbott, MD    Onset Date/Surgical Date 05/09/20      Precautions   Precautions Other (comment)    Precaution Comments splint 2 additional weeks ok to do active finger flexion outside of splint at therapy                    OT Treatments/Exercises (OP) - 06/19/20 0001      Exercises   Exercises Wrist;Hand      Weighted Stretch Over Towel Roll   Supination - Weighted Stretch 1 pound;60 seconds    Pronation - Weighted Stretch 1 pound;60 seconds    Wrist Flexion - Weighted Stretch 1 pound   attempted 1 min, able to do 30"     Wrist Exercises   Wrist Flexion PROM;AROM;10 reps    Wrist Extension PROM;AROM;10 reps  Wrist Radial Deviation PROM;AROM;10 reps    Wrist Ulnar Deviation PROM;AROM;10 reps    Other wrist exercises supination/pronation PROM and AROM 10 times       Additional Wrist Exercises   Sponges to promote digit mobility, wrist extension and forearm pronation:  12, 13, 13      Hand Exercises   Joint Blocking Exercises all digits and joints p/arom 5 times each     Opposition PROM;AROM;10 reps    Opposition Limitations to small digit finger tip radial side  x  5      Manual Therapy   Manual Therapy Myofascial release;Other (comment)    Manual therapy comments manual therapy completed seperately from all other interventions    Myofascial Release myofascial release and manual stretching to decrease pain and restrictions and improve pain free mobility in right hand, wrist, and forearm       Fine Motor Coordination (Hand/Wrist)   Fine Motor  Coordination Handwriting;Flipping cards    Flipping cards used left hand to hold stack of cards and right hand to slide card off of deck and place on table.  used right hand to pick up cards from table and place back on pile while playing 3 rounds of war.  minimal shoulder compensatory movements used for wrist extension, required less vg to correct than on previous occassions.     Handwriting using pen with tan foam built up handle, patient able to write her name and address with her dominant right hand with increased time to complete and fair +/good - legibility. see media file for example.                  OT Education - 06/19/20 1445    Education Details encouraged patient to practice handwriting and other light ADLs with use of foam for built up pens, handles.  provided 2 tan foarm cylinders for use.  provided 2 adult coloring book pages for use at home to improve coordination of right hand.    Person(s) Educated Patient    Methods Explanation    Comprehension Verbalized understanding            OT Short Term Goals - 06/07/20 0858      OT SHORT TERM GOAL #1   Title Patient will be educated and independent with HEP in order to safely mobilize her right hand in order to decrease risk of contracture and increase functional ROM.    Time 4    Period Weeks    Status On-going    Target Date 06/27/20      OT SHORT TERM GOAL #2   Title Patient will be provided a fabricated right hand dynamic splint in order to provided joint protection during healing phase of procedure while allowing for appropriate ROM.    Time 4    Period Weeks    Status Achieved      OT SHORT TERM GOAL #3   Title Patient will improve RUE forearm, wrist, and hand P/ROM to WNL for improved functional use of RUE.    Time 4    Period Weeks    Status On-going      OT SHORT TERM GOAL #4   Title Patient will decrease pain to 3/10 or better in RUE.    Time 4    Period Weeks    Status Achieved              OT Long Term Goals - 06/07/20 9935      OT  LONG TERM GOAL #1   Title Patient will return to prior level of function using her RUE as dominant with daily tasks.    Time 8    Period Weeks    Status On-going      OT LONG TERM GOAL #2   Title Patient will improve RUE a/rom to [facility] for improved ability to write, drive, and complete desired daily tasks.    Time 8    Period Weeks    Status On-going      OT LONG TERM GOAL #3   Title Patient will improve RUE strength to 4/5 or better for improved ability to complete tasks such as picking up bags of groceries.    Time 8    Period Weeks    Status On-going      OT LONG TERM GOAL #4   Title Patient will improve RUE grip and pinch strength to [facility] in order to open up jars and containers.    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    Time 8    Period Weeks    Status On-going                 Plan - 06/19/20 1446    Clinical Impression Statement A:  continued improvement with active and passive rom this date.  initiated weighted stretch for supination, pronation, wrist flexion this date with 1# for 1 min, except for wrist flexion could only tolerate 30" due to thumb discomfort. demonsrated increased wrist extension versus shoulder compensatory movements when picking cards up from the table.    Body Structure / Function / Physical Skills ADL;UE functional use;Fascial restriction;Pain;FMC;ROM;GMC;Coordination;IADL;Strength;Edema    Plan P:  Assess for MD appt, follow up on use of foam handles on pens, other utensils at home.  attempt 1 min sustained stretch for wrist flexion and extension.           Patient will benefit from skilled therapeutic intervention in order to improve the following deficits and impairments:   Body Structure / Function /  Physical Skills: ADL, UE functional use, Fascial restriction, Pain, FMC, ROM, GMC, Coordination, IADL, Strength, Edema       Visit Diagnosis: Stiffness of right hand, not elsewhere classified  Other lack of coordination  Pain in right hand  Other symptoms and signs involving the musculoskeletal system    Problem List Patient Active Problem List   Diagnosis Date Noted  . S/P carpal tunnel release and right hand flexor tenodesis multiple on 05/09/20 05/09/2020  . Contracture of joint of right forearm   . Complex regional pain syndrome i of right lower limb 04/28/2020  . S/P ORIF (open reduction internal fixation) fracture right wrist 03/03/20 03/07/2020  . Closed Barton's fracture of right radius   . Kyphosis of thoracic region 10/03/2017  . Erosive esophagitis 03/08/2013  . Unspecified constipation 03/08/2013    Vangie Bicker, [place], OTR/L [phone]  06/19/2020, 2:57 PM  [facility] [address] [place], [place], [address] Phone: [phone]   Fax:  [phone]  Name: Madison Walsh MRN: [id number] Date of Birth: [birth year]

## 2020-06-21 ENCOUNTER — Ambulatory Visit: Payer: 59

## 2020-06-21 ENCOUNTER — Ambulatory Visit (INDEPENDENT_AMBULATORY_CARE_PROVIDER_SITE_OTHER): Payer: 59 | Admitting: Orthopedic Surgery

## 2020-06-21 ENCOUNTER — Encounter: Payer: Self-pay | Admitting: Orthopedic Surgery

## 2020-06-21 ENCOUNTER — Ambulatory Visit (HOSPITAL_COMMUNITY): Payer: 59

## 2020-06-21 ENCOUNTER — Encounter (HOSPITAL_COMMUNITY): Payer: Self-pay

## 2020-06-21 ENCOUNTER — Other Ambulatory Visit: Payer: Self-pay

## 2020-06-21 VITALS — BP 134/67 | HR 70 | Ht 62.0 in | Wt 129.0 lb

## 2020-06-21 DIAGNOSIS — G90511 Complex regional pain syndrome I of right upper limb: Secondary | ICD-10-CM

## 2020-06-21 DIAGNOSIS — R278 Other lack of coordination: Secondary | ICD-10-CM

## 2020-06-21 DIAGNOSIS — Z9889 Other specified postprocedural states: Secondary | ICD-10-CM | POA: Diagnosis not present

## 2020-06-21 DIAGNOSIS — M25641 Stiffness of right hand, not elsewhere classified: Secondary | ICD-10-CM | POA: Diagnosis not present

## 2020-06-21 DIAGNOSIS — M25511 Pain in right shoulder: Secondary | ICD-10-CM

## 2020-06-21 DIAGNOSIS — M79641 Pain in right hand: Secondary | ICD-10-CM

## 2020-06-21 DIAGNOSIS — R29898 Other symptoms and signs involving the musculoskeletal system: Secondary | ICD-10-CM

## 2020-06-21 NOTE — Progress Notes (Signed)
Chief Complaint  Patient presents with  . Shoulder Pain    right painful x 3 weeks pain at night   Soreness pain stiffness right shoulder includes periscapular region as well as the trapezius muscle and deltoid.  Decreased range of motion noted since she had surgery on the right wrist for ORIF  No other trauma  X-rays were normal  Exam shows tenderness in the periscapular region as well as the superior trapezius muscle and lateral deltoid she had painful range of motion up to 120 degrees of flexion no restrictions in external rotation  Encounter Diagnoses  Name Primary?  . Acute pain of right shoulder Yes  . S/P carpal tunnel release   . Complex regional pain syndrome type 1 of right upper extremity     Recommend stretching strengthening exercises  We also saw her today for her complex regional pain syndrome status post ORIF right wrist  She has a dorsal splint she is working on flexion exercises and is improving still has pronation supination deficit  The Catapres seems to be working well she did not tolerate the gabapentin after several days of taking it  Follow-up next visit to look at the shoulder and postop visit for the wrist

## 2020-06-21 NOTE — Patient Instructions (Signed)
CANCEL APPT TOMORROW  RET TO WORK WITH RESTRICTION ON RT HAND   MAKE APPT FOR 4 WEEKS FOR SHOULDER AND HAND

## 2020-06-21 NOTE — Therapy (Addendum)
[facility] [place], [place], [address] Phone: [phone]   Fax:  [phone]  Occupational Therapy Treatment  Patient Details  Name: Madison Walsh MRN: [id number] Date of Birth: 01/11/1961 Referring Provider (OT): Arther Abbott, MD  AROM  Overall AROM Comments Not assessed prior to this date due to precautions   AROM Assessment Site Forearm;Wrist;Finger;Thumb   Right/Left Forearm Right   Right Forearm Pronation 50 Degrees   Right Forearm Supination 48 Degrees   Right/Left Wrist Right   Right Wrist Extension 20 Degrees   Right Wrist Flexion 32 Degrees   Right Wrist Radial Deviation 24 Degrees   Right Wrist Ulnar Deviation 0 Degrees   Right/Left Finger Right   Right Composite Finger Extension 75%   Right Composite Finger Flexion 75%   Right/Left Thumb Right   Right Thumb Opposition Digit 2;Digit 3     PROM  PROM Assessment Site Forearm;Wrist;Finger;Thumb   Right/Left Forearm Right   Right Forearm Pronation 52 Degrees   previous 40  Right Forearm Supination 65 Degrees   previous 65  Right/Left Wrist Right   Right Wrist Extension 36 Degrees   previous 12  Right Wrist Flexion 40 Degrees   previous 22  Right Wrist Radial Deviation 34 Degrees   not assessed prior to this date  Right Wrist Ulnar Deviation 10 Degrees   not assessed prior to this date  Right/Left Finger Right   Right Composite Finger Extension 75%   not assessed prior to this date  Right Composite Finger Flexion 75%   not assessed prior to this date  Right/Left Thumb --     Strength  Overall Strength Unable to assess;Due to precautions   Overall Strength Comments not assessed due to recent surgery      Right Hand AROM  R Thumb MCP 0-60 50 Degrees   R Thumb IP 0-80 42 Degrees   R Index  MCP 0-90 64 Degrees   R Index PIP 0-100 62 Degrees   R Index DIP 0-70 0 Degrees   R Long  MCP 0-90 70 Degrees   R Long PIP 0-100 64 Degrees   R Long DIP 0-70 0  Degrees   R Ring  MCP 0-90 52 Degrees   R Ring PIP 0-100 72 Degrees   R Ring DIP 0-70 0 Degrees   R Little  MCP 0-90 56 Degrees   R Little PIP 0-100 76 Degrees   R Little DIP 0-70 30 Degrees     Right Hand PROM  R Thumb MCP 0-60 64 Degrees   previous 50  R Thumb IP 0-80 44 Degrees   previous 40  R Index  MCP 0-90 70 Degrees   previous 70  R Index PIP 0-100 76 Degrees   previous 72  R Index DIP 0-70 42 Degrees   previous 38  R Long  MCP 0-90 80 Degrees   previous 66  R Long PIP 0-100 78 Degrees   previous 70  R Long DIP 0-70 35 Degrees   previous 20  R Ring  MCP 0-90 72 Degrees   previous 68  R Ring PIP 0-100 78 Degrees   previous 70  R Ring DIP 0-70 70 Degrees   previous 90  R Little  MCP 0-90 78 Degrees   previous 62  R Little PIP 0-100 90 Degrees   previous 70  R Little DIP 0-70 60 Degrees   previous 50    Encounter Date: 06/21/2020  OT End of Session - 06/21/20 1220    Visit Number 10    Number of Visits 18    Date for OT Re-Evaluation 07/24/20    Authorization Type [employer]    Authorization Time Period 60 visit limit. No copay    Authorization - Visit Number 10    Authorization - Number of Visits 89    OT Start Time 1660    OT Stop Time 1159    OT Time Calculation (min) 45 min    Activity Tolerance Patient tolerated treatment well    Behavior During Therapy WFL for tasks assessed/performed           Past Medical History:  Diagnosis Date  . Constipation   . GERD (gastroesophageal reflux disease)   . PONV (postoperative nausea and vomiting)   . Ulcer of esophagus     Past Surgical History:  Procedure Laterality Date  . CERVICAL CONIZATION W/BX  1983  . COLONOSCOPY  06/27/2011   Procedure: COLONOSCOPY;  Surgeon: Rogene Houston, MD;  [facility];  Service: Endoscopy;  Laterality: N/A;  12:00  . ESOPHAGOGASTRODUODENOSCOPY N/A 08/10/2015   Procedure: ESOPHAGOGASTRODUODENOSCOPY (EGD);  Surgeon: Rogene Houston, MD;  [facility];   Service: Endoscopy;  Laterality: N/A;  1:00  . FLEXOR TENOTOMY Right 05/09/2020   Procedure: Tenotomy flexor tendons right hand;  Surgeon: Carole Civil, MD;  Location: [facility];  Service: Orthopedics;  Laterality: Right;  . OPEN REDUCTION INTERNAL FIXATION (ORIF) DISTAL RADIAL FRACTURE Right 03/03/2020   Procedure: OPEN REDUCTION INTERNAL FIXATION (ORIF) RIGHT WRIST FRACTURE;  Surgeon: Carole Civil, MD;  Location: [facility];  Service: Orthopedics;  Laterality: Right;    There were no vitals filed for this visit.   Subjective Assessment - 06/21/20 1116    Subjective  S: My wrist hurts when I move it more so where my nerve issues are    Currently in Pain? Yes    Pain Score 3     Pain Location Wrist    Pain Orientation Right    Pain Descriptors / Indicators Aching    Pain Type Acute pain              [facility] OT Assessment - 06/21/20 1116      Assessment   Medical Diagnosis Tenotomy Flexor tendons    Referring Provider (OT) Arther Abbott, MD    Onset Date/Surgical Date 05/09/20      Precautions   Precautions Other (comment)    Precaution Comments Splint only at night, A/ROM AA/ROM P/ROM hand and wrist      Coordination   9 Hole Peg Test Right;Left    Right 9 Hole Peg Test 1'01"   not assessed prior to this date   Left 9 Hole Peg Test 24.60"      ROM / Strength   AROM / PROM / Strength AROM;PROM      AROM   Overall AROM Comments Not assessed prior to this date due to precautions    AROM Assessment Site Forearm;Wrist;Finger;Thumb    Right/Left Forearm Right    Right Forearm Pronation 50 Degrees    Right Forearm Supination 48 Degrees    Right/Left Wrist Right    Right Wrist Extension 20 Degrees    Right Wrist Flexion 32 Degrees    Right Wrist Radial Deviation 24 Degrees    Right Wrist Ulnar Deviation 0 Degrees    Right/Left Finger Right    Right Composite Finger Extension 75%  Right Composite Finger Flexion 75%    Right/Left Thumb Right    Right Thumb  Opposition Digit 2;Digit 3      PROM   PROM Assessment Site Forearm;Wrist;Finger;Thumb    Right/Left Forearm Right    Right Forearm Pronation 52 Degrees   previous 40   Right Forearm Supination 65 Degrees   previous 65   Right/Left Wrist Right    Right Wrist Extension 36 Degrees   previous 12   Right Wrist Flexion 40 Degrees   previous 22   Right Wrist Radial Deviation 34 Degrees   not assessed prior to this date   Right Wrist Ulnar Deviation 10 Degrees   not assessed prior to this date   Right/Left Finger Right    Right Composite Finger Extension 75%   not assessed prior to this date   Right Composite Finger Flexion 75%   not assessed prior to this date   Right/Left Thumb --      Strength   Overall Strength Unable to assess;Due to precautions    Overall Strength Comments not assessed due to recent surgery       Right Hand AROM   R Thumb MCP 0-60 50 Degrees    R Thumb IP 0-80 42 Degrees    R Index  MCP 0-90 64 Degrees    R Index PIP 0-100 62 Degrees    R Index DIP 0-70 0 Degrees    R Long  MCP 0-90 70 Degrees    R Long PIP 0-100 64 Degrees    R Long DIP 0-70 0 Degrees    R Ring  MCP 0-90 52 Degrees    R Ring PIP 0-100 72 Degrees    R Ring DIP 0-70 0 Degrees    R Little  MCP 0-90 56 Degrees    R Little PIP 0-100 76 Degrees    R Little DIP 0-70 30 Degrees      Right Hand PROM   R Thumb MCP 0-60 64 Degrees   previous 50   R Thumb IP 0-80 44 Degrees   previous 40   R Index  MCP 0-90 70 Degrees   previous 70   R Index PIP 0-100 76 Degrees   previous 72   R Index DIP 0-70 42 Degrees   previous 38   R Long  MCP 0-90 80 Degrees   previous 66   R Long PIP 0-100 78 Degrees   previous 70   R Long DIP 0-70 35 Degrees   previous 20   R Ring  MCP 0-90 72 Degrees   previous 68   R Ring PIP 0-100 78 Degrees   previous 70   R Ring DIP 0-70 70 Degrees   previous 90   R Little  MCP 0-90 78 Degrees   previous 62   R Little PIP 0-100 90 Degrees   previous 70   R Little DIP 0-70 60  Degrees   previous 32                           OT Education - 06/21/20 1218    Education Details Reviewed goals and discussed progress, discussed splint-wear for night time only and that patient can do stretches within her comfort at home    Person(s) Educated Patient    Methods Explanation    Comprehension Verbalized understanding            OT Short Term Goals -  06/21/20 [place] #1   Title Patient will be educated and independent with HEP in order to safely mobilize her right hand in order to decrease risk of contracture and increase functional ROM.    Time 4    Period Weeks    Status --    Target Date 06/27/20      OT SHORT TERM GOAL #2   Title Patient will be provided a fabricated right hand dynamic splint in order to provided joint protection during healing phase of procedure while allowing for appropriate ROM.    Time 4    Period Weeks    Status --      OT SHORT TERM GOAL #3   Title Patient will improve RUE forearm, wrist, and hand P/ROM to WNL for improved functional use of RUE.    Time 4    Period Weeks    Status On-going      OT SHORT TERM GOAL #4   Title Patient will decrease pain to 3/10 or better in RUE.    Time 4    Period Weeks    Status --             OT Long Term Goals - 06/21/20 1158      OT LONG TERM GOAL #1   Title Patient will return to prior level of function using her RUE as dominant with daily tasks.    Time 8    Period Weeks    Status On-going      OT LONG TERM GOAL #2   Title Patient will improve RUE a/rom to [facility] for improved ability to write, drive, and complete desired daily tasks.    Time 8    Period Weeks    Status On-going      OT LONG TERM GOAL #3   Title Patient will improve RUE strength to 4/5 or better for improved ability to complete tasks such as picking up bags of groceries.    Time 8    Period Weeks    Status On-going      OT LONG TERM GOAL #4   Title Patient will improve  RUE grip and pinch strength to [facility] in order to open up jars and containers.    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    Baseline 06/21/20 goal revised for a specific time using nine hole peg test    Time 8    Period Weeks    Status Revised                 Plan - 06/21/20 1225    Clinical Impression Statement A: Reassesed measurements for MD appointment with addition of nine hole peg test for measuring coordination. Overall patient is demonstrating improvements with P/ROM and A/ROM, however still demonstrates limitations in functional hand, wrist and forearm movements. Patient completed 9 hole peg test this date, with her right affected hand requiring increased time and effort to complete.    Body Structure / Function / Physical Skills ADL;UE functional use;Fascial restriction;Pain;FMC;ROM;GMC;Coordination;IADL;Strength;Edema    Plan P: Follow up on use of foam handles on pens, other utensils at home, coordination task, begin light-stretching on  fingers and provide HEP per patient request, attempt 1 min sustained stretch for wrist flexion and extension    Consulted and Agree with Plan of Care Patient           Patient will benefit from skilled therapeutic intervention in order to improve the following deficits and impairments:   Body Structure / Function / Physical Skills: ADL, UE functional use, Fascial restriction, Pain, FMC, ROM, GMC, Coordination, IADL, Strength, Edema       Visit Diagnosis: Stiffness of right hand, not elsewhere classified  Other lack of coordination  Pain in right hand  Other symptoms and signs involving the musculoskeletal system    Problem List Patient Active Problem List   Diagnosis Date Noted  . S/P carpal tunnel release and right hand  flexor tenodesis multiple on 05/09/20 05/09/2020  . Contracture of joint of right forearm   . Complex regional pain syndrome i of right lower limb 04/28/2020  . S/P ORIF (open reduction internal fixation) fracture right wrist 03/03/20 03/07/2020  . Closed Barton's fracture of right radius   . Kyphosis of thoracic region 10/03/2017  . Erosive esophagitis 03/08/2013  . Unspecified constipation 03/08/2013     [place], [place] Student [phone]   06/21/2020, 4:43 PM  [facility] [address] [place], [place], [address] Phone: [phone]   Fax:  [phone]  Name: KIMRA KANTOR MRN: [id number] Date of Birth: Jul 23, 1961

## 2020-06-22 ENCOUNTER — Ambulatory Visit: Payer: 59 | Admitting: Orthopedic Surgery

## 2020-06-22 DIAGNOSIS — Z9889 Other specified postprocedural states: Secondary | ICD-10-CM

## 2020-06-26 ENCOUNTER — Encounter (HOSPITAL_COMMUNITY): Payer: Self-pay | Admitting: Specialist

## 2020-06-26 ENCOUNTER — Ambulatory Visit (HOSPITAL_COMMUNITY): Payer: 59 | Admitting: Specialist

## 2020-06-26 ENCOUNTER — Other Ambulatory Visit: Payer: Self-pay

## 2020-06-26 DIAGNOSIS — M25641 Stiffness of right hand, not elsewhere classified: Secondary | ICD-10-CM

## 2020-06-26 DIAGNOSIS — M79641 Pain in right hand: Secondary | ICD-10-CM

## 2020-06-26 DIAGNOSIS — R29898 Other symptoms and signs involving the musculoskeletal system: Secondary | ICD-10-CM

## 2020-06-26 DIAGNOSIS — R278 Other lack of coordination: Secondary | ICD-10-CM

## 2020-06-26 NOTE — Therapy (Signed)
[place], [place], [address] Phone: [phone]   Fax:  [phone]  Occupational Therapy Treatment  Patient Details  Name: Madison Walsh MRN: [id number] Date of Birth: 11/06/1960 Referring Provider (OT): Arther Abbott, MD   Encounter Date: 06/26/2020   OT End of Session - 06/26/20 1209    Visit Number 11    Number of Visits 18    Date for OT Re-Evaluation 07/24/20    Authorization Type [employer]    Authorization Time Period 60 visit limit. No copay    Authorization - Visit Number 11    Authorization - Number of Visits 53    OT Start Time 1115    OT Stop Time 1200    OT Time Calculation (min) 45 min    Activity Tolerance Patient tolerated treatment well    Behavior During Therapy WFL for tasks assessed/performed           Past Medical History:  Diagnosis Date  . Constipation   . GERD (gastroesophageal reflux disease)   . PONV (postoperative nausea and vomiting)   . Ulcer of esophagus     Past Surgical History:  Procedure Laterality Date  . CERVICAL CONIZATION W/BX  1983  . COLONOSCOPY  06/27/2011   Procedure: COLONOSCOPY;  Surgeon: Rogene Houston, MD;  [facility];  Service: Endoscopy;  Laterality: N/A;  12:00  . ESOPHAGOGASTRODUODENOSCOPY N/A 08/10/2015   Procedure: ESOPHAGOGASTRODUODENOSCOPY (EGD);  Surgeon: Rogene Houston, MD;  [facility];  Service: Endoscopy;  Laterality: N/A;  1:00  . FLEXOR TENOTOMY Right 05/09/2020   Procedure: Tenotomy flexor tendons right hand;  Surgeon: Carole Civil, MD;  Location: [facility];  Service: Orthopedics;  Laterality: Right;  . OPEN REDUCTION INTERNAL FIXATION (ORIF) DISTAL RADIAL FRACTURE Right 03/03/2020   Procedure: OPEN REDUCTION INTERNAL FIXATION (ORIF) RIGHT WRIST FRACTURE;  Surgeon: Carole Civil, MD;  Location: [facility];  Service: Orthopedics;  Laterality: Right;    There were no vitals filed for this visit.    Subjective Assessment - 06/26/20 1209    Subjective  S:  I have been trying to use my arm and hand around the house more.  I emptied the dryer today using my right hand.    Currently in Pain? Yes    Pain Score 3     Pain Location Wrist    Pain Orientation Right    Pain Descriptors / Indicators Aching    Pain Type Acute pain              [facility] OT Assessment - 06/26/20 0001      Assessment   Medical Diagnosis Tenotomy Flexor tendons    Referring Provider (OT) Arther Abbott, MD      Precautions   Precautions Other (comment)    Precaution Comments Splint only at night, A/ROM AA/ROM P/ROM hand and wrist                    OT Treatments/Exercises (OP) - 06/26/20 0001      Exercises   Exercises Wrist;Hand;Theraputty      Weighted Stretch Over Towel Roll   Supination - Weighted Stretch 1 pound;60 seconds    Pronation - Weighted Stretch 1 pound;60 seconds    Wrist Flexion - Weighted Stretch 1 pound;60 seconds    Wrist Extension - Weighted Stretch 1 pound;60 seconds      Wrist Exercises   Wrist Flexion PROM;AROM;10 reps  Wrist Extension PROM;AROM;10 reps    Wrist Radial Deviation PROM;AROM;10 reps    Wrist Ulnar Deviation PROM;AROM;10 reps    Other wrist exercises supination/pronation PROM and AROM 10 times       Hand Exercises   Joint Blocking Exercises all digits and joints p/arom 5 times each     Opposition PROM;AROM;10 reps    Digit Abduction/Adduction a/rom x 10     Other Hand Exercises digit extension 5 times each at each joint       Theraputty   Theraputty - Flatten yellow in standing to promote wrist and digit extension    Theraputty - Roll yellow     Theraputty - Grip pronated grip       Manual Therapy   Manual Therapy Myofascial release;Other (comment)    Manual therapy comments manual therapy completed seperately from all other interventions    Myofascial Release myofascial release and manual stretching to decrease pain and restrictions and  improve pain free mobility in right hand, wrist, and forearm     Other Manual Therapy gentle manual traction to right thumb      Fine Motor Coordination (Hand/Wrist)   Fine Motor Coordination Tendon glides    Tendon Glides 5 times with max verbal guidance                     OT Short Term Goals - 06/21/20 1221      OT SHORT TERM GOAL #1   Title Patient will be educated and independent with HEP in order to safely mobilize her right hand in order to decrease risk of contracture and increase functional ROM.    Time 4    Period Weeks    Status --    Target Date 06/27/20      OT SHORT TERM GOAL #2   Title Patient will be provided a fabricated right hand dynamic splint in order to provided joint protection during healing phase of procedure while allowing for appropriate ROM.    Time 4    Period Weeks    Status --      OT SHORT TERM GOAL #3   Title Patient will improve RUE forearm, wrist, and hand P/ROM to WNL for improved functional use of RUE.    Time 4    Period Weeks    Status On-going      OT SHORT TERM GOAL #4   Title Patient will decrease pain to 3/10 or better in RUE.    Time 4    Period Weeks    Status --             OT Long Term Goals - 06/21/20 1158      OT LONG TERM GOAL #1   Title Patient will return to prior level of function using her RUE as dominant with daily tasks.    Time 8    Period Weeks    Status On-going      OT LONG TERM GOAL #2   Title Patient will improve RUE a/rom to [facility] for improved ability to write, drive, and complete desired daily tasks.    Time 8    Period Weeks    Status On-going      OT LONG TERM GOAL #3   Title Patient will improve RUE strength to 4/5 or better for improved ability to complete tasks such as picking up bags of groceries.    Time 8    Period Weeks    Status On-going  OT LONG TERM GOAL #4   Title Patient will improve RUE grip and pinch strength to [facility] in order to open up jars and containers.    Time  8    Period Weeks    Status On-going      OT LONG TERM GOAL #5   Title Patient will decrease her nine hole peg test time by at least 20" while using her right hand to promote ability to fasten jewelery and write with her dominant right hand.    Baseline 06/21/20 goal revised for a specific time using nine hole peg test    Time 8    Period Weeks    Status Revised                 Plan - 06/26/20 1210    Clinical Impression Statement A:  Patient able to tolerate increased P/ROM and stretching to right forearm, wrist, and hand this date.  Added digit mobiity exercises and yellow tputty for wrist and digit extension.    Body Structure / Function / Physical Skills ADL;UE functional use;Fascial restriction;Pain;FMC;ROM;GMC;Coordination;IADL;Strength;Edema    Plan P:  increase to 90" stretch with 1# for supination, pronation, wrist flexion, extension.  continue gentle passive traction to right thumb for greater mobility needed for use of dominant right hand manipulating daily objects.           Patient will benefit from skilled therapeutic intervention in order to improve the following deficits and impairments:   Body Structure / Function / Physical Skills: ADL, UE functional use, Fascial restriction, Pain, FMC, ROM, GMC, Coordination, IADL, Strength, Edema       Visit Diagnosis: Other lack of coordination  Stiffness of right hand, not elsewhere classified  Pain in right hand  Other symptoms and signs involving the musculoskeletal system    Problem List Patient Active Problem List   Diagnosis Date Noted  . S/P carpal tunnel release and right hand flexor tenodesis multiple on 05/09/20 05/09/2020  . Contracture of joint of right forearm   . Complex regional pain syndrome i of right lower limb 04/28/2020  . S/P ORIF (open reduction internal fixation) fracture right wrist 03/03/20 03/07/2020  . Closed Barton's fracture of right radius   . Kyphosis of thoracic region  10/03/2017  . Erosive esophagitis 03/08/2013  . Unspecified constipation 03/08/2013    Vangie Bicker, [place], OTR/L [phone]  06/26/2020, 12:15 PM  [place] [address] [place], [place], [address] Phone: [phone]   Fax:  [phone]  Name: DENYCE HARR MRN: [id number] Date of Birth: November 24, 1960

## 2020-06-28 ENCOUNTER — Other Ambulatory Visit: Payer: Self-pay

## 2020-06-28 ENCOUNTER — Ambulatory Visit (HOSPITAL_COMMUNITY): Payer: 59

## 2020-06-28 ENCOUNTER — Encounter (HOSPITAL_COMMUNITY): Payer: Self-pay

## 2020-06-28 DIAGNOSIS — M25641 Stiffness of right hand, not elsewhere classified: Secondary | ICD-10-CM | POA: Diagnosis not present

## 2020-06-28 DIAGNOSIS — R278 Other lack of coordination: Secondary | ICD-10-CM

## 2020-06-28 DIAGNOSIS — M79641 Pain in right hand: Secondary | ICD-10-CM

## 2020-06-28 DIAGNOSIS — R29898 Other symptoms and signs involving the musculoskeletal system: Secondary | ICD-10-CM

## 2020-06-28 NOTE — Therapy (Addendum)
[place], [place], [address] Phone: [phone]   Fax:  [phone]  Occupational Therapy Treatment  Patient Details  Name: Madison Walsh MRN: [id number] Date of Birth: [birth year] Referring Provider (OT): Arther Abbott, MD   Encounter Date: 06/28/2020   OT End of Session - 06/28/20 1135    Visit Number 12    Number of Visits 18    Date for OT Re-Evaluation 07/24/20    Authorization Type [employer]    Authorization Time Period 60 visit limit. No copay    Authorization - Visit Number 12    Authorization - Number of Visits 56    OT Start Time 1324    OT Stop Time 1158    OT Time Calculation (min) 42 min    Activity Tolerance Patient tolerated treatment well    Behavior During Therapy WFL for tasks assessed/performed           Past Medical History:  Diagnosis Date   Constipation    GERD (gastroesophageal reflux disease)    PONV (postoperative nausea and vomiting)    Ulcer of esophagus     Past Surgical History:  Procedure Laterality Date   CERVICAL CONIZATION W/BX  1983   COLONOSCOPY  06/27/2011   Procedure: COLONOSCOPY;  Surgeon: Rogene Houston, MD;  [facility];  Service: Endoscopy;  Laterality: N/A;  12:00   ESOPHAGOGASTRODUODENOSCOPY N/A 08/10/2015   Procedure: ESOPHAGOGASTRODUODENOSCOPY (EGD);  Surgeon: Rogene Houston, MD;  [facility];  Service: Endoscopy;  Laterality: N/A;  1:00   FLEXOR TENOTOMY Right 05/09/2020   Procedure: Tenotomy flexor tendons right hand;  Surgeon: Carole Civil, MD;  Location: [facility];  Service: Orthopedics;  Laterality: Right;   OPEN REDUCTION INTERNAL FIXATION (ORIF) DISTAL RADIAL FRACTURE Right 03/03/2020   Procedure: OPEN REDUCTION INTERNAL FIXATION (ORIF) RIGHT WRIST FRACTURE;  Surgeon: Carole Civil, MD;  Location: [facility];  Service: Orthopedics;  Laterality: Right;    There were no vitals filed for this visit.    Subjective Assessment - 06/28/20 1119    Subjective  S: My hand is less stiff today    Pain Score 3     Pain Location Wrist    Pain Orientation Right    Pain Descriptors / Indicators Aching    Pain Type Acute pain    Pain Radiating Towards n/a    Pain Onset More than a month ago    Pain Frequency Intermittent    Aggravating Factors  movement    Pain Relieving Factors meds    Effect of Pain on Daily Activities mod    Multiple Pain Sites No              [facility] OT Assessment - 06/28/20 0001      Assessment   Medical Diagnosis Tenotomy Flexor tendons      Precautions   Precautions Other (comment)    Precaution Comments Splint only at night, A/ROM AA/ROM P/ROM hand and wrist                    OT Treatments/Exercises (OP) - 06/28/20 1120      Exercises   Exercises Wrist;Hand;Theraputty      Weighted Stretch Over Towel Roll   Supination - Weighted Stretch 1 pound;90 seconds    Pronation - Weighted Stretch 1 pound;90 seconds    Wrist Flexion - Weighted Stretch 1 pound;90 seconds    Wrist Extension - Weighted  Stretch 1 pound;90 seconds      Wrist Exercises   Wrist Flexion PROM;AROM;10 reps    Wrist Extension PROM;AROM;10 reps    Wrist Radial Deviation PROM;AROM;10 reps    Wrist Ulnar Deviation PROM;AROM;10 reps    Other wrist exercises supination/pronation PROM and AROM 10 times       Hand Exercises   Opposition PROM;AROM;10 reps    Other Hand Exercises digit extension 5 times each at each joint       Theraputty   Theraputty - Flatten yellow in standing to promote wrist and digit extension    Theraputty - Roll yellow     Theraputty - Grip pronated grip       Manual Therapy   Manual Therapy Myofascial release;Other (comment)    Manual therapy comments manual therapy completed seperately from all other interventions    Myofascial Release myofascial release and manual stretching to decrease pain and restrictions and improve pain free mobility in right hand,  wrist, and forearm     Other Manual Therapy gentle manual traction to right thumb      Fine Motor Coordination (Hand/Wrist)   Fine Motor Coordination Tendon glides    Tendon Glides 5 times with max verbal guidance                     OT Short Term Goals - 06/21/20 1221      OT SHORT TERM GOAL #1   Title Patient will be educated and independent with HEP in order to safely mobilize her right hand in order to decrease risk of contracture and increase functional ROM.    Time 4    Period Weeks    Status --    Target Date 06/27/20      OT SHORT TERM GOAL #2   Title Patient will be provided a fabricated right hand dynamic splint in order to provided joint protection during healing phase of procedure while allowing for appropriate ROM.    Time 4    Period Weeks    Status --      OT SHORT TERM GOAL #3   Title Patient will improve RUE forearm, wrist, and hand P/ROM to WNL for improved functional use of RUE.    Time 4    Period Weeks    Status On-going      OT SHORT TERM GOAL #4   Title Patient will decrease pain to 3/10 or better in RUE.    Time 4    Period Weeks    Status --             OT Long Term Goals - 06/28/20 1334      OT LONG TERM GOAL #1   Title Patient will return to prior level of function using her RUE as dominant with daily tasks.    Time 8    Period Weeks    Status On-going      OT LONG TERM GOAL #2   Title Patient will improve RUE a/rom to [facility] for improved ability to write, drive, and complete desired daily tasks.    Time 8    Period Weeks    Status On-going      OT LONG TERM GOAL #3   Title Patient will improve RUE strength to 4/5 or better for improved ability to complete tasks such as picking up bags of groceries.    Time 8    Period Weeks    Status On-going  OT LONG TERM GOAL #4   Title Patient will improve RUE grip and pinch strength to [facility] in order to open up jars and containers.    Time 8    Period Weeks    Status On-going       OT LONG TERM GOAL #5   Title Patient will decrease her nine hole peg test time by at least 20" while using her right hand to promote ability to fasten jewelery and write with her dominant right hand.    Baseline 06/21/20 goal revised for a specific time using nine hole peg test    Time 8    Period Weeks    Status On-going                 Plan - 06/28/20 1153    Clinical Impression Statement A: Continued traction of thumb during passive stretches as well as continued A/ROM of forearm, wrist and fingers with tactile cues and stabilization required throughout for form and technique. Patient progressed to 90" for prolonged stretch this session with no report of increased pain or discomfort.    Body Structure / Function / Physical Skills ADL;UE functional use;Fascial restriction;Pain;FMC;ROM;GMC;Coordination;IADL;Strength;Edema    Plan P: continue prolonged stretch for 90", attempt pvc pipe with putty for wrist mobility and/or rolling putty into ball for finger extension    Consulted and Agree with Plan of Care Patient           Patient will benefit from skilled therapeutic intervention in order to improve the following deficits and impairments:   Body Structure / Function / Physical Skills: ADL, UE functional use, Fascial restriction, Pain, FMC, ROM, GMC, Coordination, IADL, Strength, Edema       Visit Diagnosis: Other lack of coordination  Stiffness of right hand, not elsewhere classified  Pain in right hand  Other symptoms and signs involving the musculoskeletal system    Problem List Patient Active Problem List   Diagnosis Date Noted   S/P carpal tunnel release and right hand flexor tenodesis multiple on 05/09/20 05/09/2020   Contracture of joint of right forearm    Complex regional pain syndrome i of right lower limb 04/28/2020   S/P ORIF (open reduction internal fixation) fracture right wrist 03/03/20 03/07/2020   Closed Barton's fracture of right radius     Kyphosis of thoracic region 10/03/2017   Erosive esophagitis 03/08/2013   Unspecified constipation 03/08/2013     [place], [place] Student [phone]   06/28/2020, 1:35 PM  [place], [place], [address] Phone: [phone]   Fax:  [phone]  Name: Madison Walsh MRN: [id number] Date of Birth: May 13, 1961

## 2020-07-03 ENCOUNTER — Other Ambulatory Visit: Payer: Self-pay

## 2020-07-03 ENCOUNTER — Ambulatory Visit (HOSPITAL_COMMUNITY): Payer: 59

## 2020-07-03 ENCOUNTER — Encounter (HOSPITAL_COMMUNITY): Payer: Self-pay

## 2020-07-03 DIAGNOSIS — M25641 Stiffness of right hand, not elsewhere classified: Secondary | ICD-10-CM | POA: Diagnosis not present

## 2020-07-03 DIAGNOSIS — M79641 Pain in right hand: Secondary | ICD-10-CM

## 2020-07-03 DIAGNOSIS — R278 Other lack of coordination: Secondary | ICD-10-CM

## 2020-07-03 DIAGNOSIS — R29898 Other symptoms and signs involving the musculoskeletal system: Secondary | ICD-10-CM

## 2020-07-03 NOTE — Therapy (Addendum)
[place], [place], [address] Phone: [phone]   Fax:  [phone]  Occupational Therapy Treatment  Patient Details  Name: Madison Walsh MRN: [id number] Date of Birth: December 02, 1960 Referring Provider (OT): Arther Abbott, MD   Encounter Date: 07/03/2020   OT End of Session - 07/03/20 1531    Visit Number 13    Number of Visits 18    Date for OT Re-Evaluation 07/24/20    Authorization Type [employer]    Authorization Time Period 60 visit limit. No copay    Authorization - Visit Number 12    Authorization - Number of Visits 28    OT Start Time 2229    OT Stop Time 1557    OT Time Calculation (min) 42 min    Activity Tolerance Patient tolerated treatment well    Behavior During Therapy WFL for tasks assessed/performed           Past Medical History:  Diagnosis Date  . Constipation   . GERD (gastroesophageal reflux disease)   . PONV (postoperative nausea and vomiting)   . Ulcer of esophagus     Past Surgical History:  Procedure Laterality Date  . CERVICAL CONIZATION W/BX  1983  . COLONOSCOPY  06/27/2011   Procedure: COLONOSCOPY;  Surgeon: Rogene Houston, MD;  [facility];  Service: Endoscopy;  Laterality: N/A;  12:00  . ESOPHAGOGASTRODUODENOSCOPY N/A 08/10/2015   Procedure: ESOPHAGOGASTRODUODENOSCOPY (EGD);  Surgeon: Rogene Houston, MD;  [facility];  Service: Endoscopy;  Laterality: N/A;  1:00  . FLEXOR TENOTOMY Right 05/09/2020   Procedure: Tenotomy flexor tendons right hand;  Surgeon: Carole Civil, MD;  Location: [facility];  Service: Orthopedics;  Laterality: Right;  . OPEN REDUCTION INTERNAL FIXATION (ORIF) DISTAL RADIAL FRACTURE Right 03/03/2020   Procedure: OPEN REDUCTION INTERNAL FIXATION (ORIF) RIGHT WRIST FRACTURE;  Surgeon: Carole Civil, MD;  Location: [facility];  Service: Orthopedics;  Laterality: Right;    There were no vitals filed for this visit.    Subjective Assessment - 07/03/20 1517    Subjective  S: I don't know if I overdid it but my fingers felt a little numb after    Currently in Pain? No/denies              [facility] OT Assessment - 07/03/20 1531      Assessment   Medical Diagnosis Tenotomy Flexor tendons      Precautions   Precautions Other (comment)    Precaution Comments Splint only at night, A/ROM AA/ROM P/ROM hand and wrist                    OT Treatments/Exercises (OP) - 07/03/20 1519      Exercises   Exercises Wrist;Hand;Theraputty      Weighted Stretch Over Towel Roll   Supination - Weighted Stretch 1 pound;90 seconds    Pronation - Weighted Stretch 1 pound;90 seconds    Wrist Flexion - Weighted Stretch 1 pound;90 seconds    Wrist Extension - Weighted Stretch 1 pound;90 seconds      Wrist Exercises   Wrist Flexion PROM;AROM;Other reps (comment)   12X   Wrist Extension PROM;AROM;Other reps (comment)   12X   Wrist Radial Deviation PROM;AROM;Other reps (comment)   12X   Wrist Ulnar Deviation PROM;AROM;Other reps (comment)   12X   Other wrist exercises supination/pronation PROM and AROM 12 times       Additional Wrist  Exercises   Sponges Yellow clip, using 3 point pinch to stack 6 towers of 4 sponges, then lateral pinch to transfer 24 sponges back into box.      Hand Exercises   Digit Abduction/Adduction a/rom x 10     Other Hand Exercises digit extension 5 times each at each joint       Manual Therapy   Manual Therapy Myofascial release;Other (comment)    Manual therapy comments manual therapy completed seperately from all other interventions    Myofascial Release myofascial release and manual stretching to decrease pain and restrictions and improve pain free mobility in right hand, wrist, and forearm     Other Manual Therapy --                  OT Education - 07/03/20 1613    Education Details Discussed not overdoing the stretching exercises if numbness and tingling persists,  but educated that some sensation is normal to feel after surgery as the wrist and nerves heal. Recommended to use ice and heat if pain occurs and educated that ice prevents soreness after therapy sessions    Person(s) Educated Patient    Methods Explanation    Comprehension Verbalized understanding            OT Short Term Goals - 06/21/20 1221      OT SHORT TERM GOAL #1   Title Patient will be educated and independent with HEP in order to safely mobilize her right hand in order to decrease risk of contracture and increase functional ROM.    Time 4    Period Weeks    Status --    Target Date 06/27/20      OT SHORT TERM GOAL #2   Title Patient will be provided a fabricated right hand dynamic splint in order to provided joint protection during healing phase of procedure while allowing for appropriate ROM.    Time 4    Period Weeks    Status --      OT SHORT TERM GOAL #3   Title Patient will improve RUE forearm, wrist, and hand P/ROM to WNL for improved functional use of RUE.    Time 4    Period Weeks    Status On-going      OT SHORT TERM GOAL #4   Title Patient will decrease pain to 3/10 or better in RUE.    Time 4    Period Weeks    Status --             OT Long Term Goals - 06/28/20 1334      OT LONG TERM GOAL #1   Title Patient will return to prior level of function using her RUE as dominant with daily tasks.    Time 8    Period Weeks    Status On-going      OT LONG TERM GOAL #2   Title Patient will improve RUE a/rom to [facility] for improved ability to write, drive, and complete desired daily tasks.    Time 8    Period Weeks    Status On-going      OT LONG TERM GOAL #3   Title Patient will improve RUE strength to 4/5 or better for improved ability to complete tasks such as picking up bags of groceries.    Time 8    Period Weeks    Status On-going      OT LONG TERM GOAL #4   Title Patient will  improve RUE grip and pinch strength to [facility] in order to open up jars  and containers.    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    Baseline 06/21/20 goal revised for a specific time using nine hole peg test    Time 8    Period Weeks    Status On-going                 Plan - 07/03/20 1548    Clinical Impression Statement A: Continued manual techniques, along with prolonged wrist stretches. Increased A/ROM to 12 reps, and introduced clip and sponge activity for promoting pinch strength with patient demonstrating mod difficulty with lateral pinch due to limited wrist supination however improved with side approach to sponge as well as sponge being placed near edge of table with elbow bent and by side versus arm straightened out to pinch. VC required for technique and positioning of fingers for each pinch technique.    Body Structure / Function / Physical Skills ADL;UE functional use;Fascial restriction;Pain;FMC;ROM;GMC;Coordination;IADL;Strength;Edema    Plan P: continue pronlonged stretch and pinching sponges,  rolling putty in ball for focus on finger extension    Consulted and Agree with Plan of Care Patient           Patient will benefit from skilled therapeutic intervention in order to improve the following deficits and impairments:   Body Structure / Function / Physical Skills: ADL, UE functional use, Fascial restriction, Pain, FMC, ROM, GMC, Coordination, IADL, Strength, Edema       Visit Diagnosis: Other lack of coordination  Stiffness of right hand, not elsewhere classified  Pain in right hand  Other symptoms and signs involving the musculoskeletal system    Problem List Patient Active Problem List   Diagnosis Date Noted  . S/P carpal tunnel release and right hand flexor tenodesis multiple on 05/09/20 05/09/2020  .  Contracture of joint of right forearm   . Complex regional pain syndrome i of right lower limb 04/28/2020  . S/P ORIF (open reduction internal fixation) fracture right wrist 03/03/20 03/07/2020  . Closed Barton's fracture of right radius   . Kyphosis of thoracic region 10/03/2017  . Erosive esophagitis 03/08/2013  . Unspecified constipation 03/08/2013    [place], [place] Student [phone]   07/03/2020, 4:15 PM  [place] [address] [place], [place], [address] Phone: [phone]   Fax:  [phone]  Name: Madison Walsh MRN: [id number] Date of Birth: 27-Sep-1960

## 2020-07-05 ENCOUNTER — Ambulatory Visit (HOSPITAL_COMMUNITY): Payer: 59 | Admitting: Occupational Therapy

## 2020-07-05 ENCOUNTER — Encounter (HOSPITAL_COMMUNITY): Payer: Self-pay | Admitting: Occupational Therapy

## 2020-07-05 ENCOUNTER — Other Ambulatory Visit: Payer: Self-pay

## 2020-07-05 DIAGNOSIS — M79641 Pain in right hand: Secondary | ICD-10-CM

## 2020-07-05 DIAGNOSIS — R278 Other lack of coordination: Secondary | ICD-10-CM

## 2020-07-05 DIAGNOSIS — M25641 Stiffness of right hand, not elsewhere classified: Secondary | ICD-10-CM | POA: Diagnosis not present

## 2020-07-05 DIAGNOSIS — R29898 Other symptoms and signs involving the musculoskeletal system: Secondary | ICD-10-CM

## 2020-07-05 NOTE — Therapy (Signed)
[place], [place], [address] Phone: [phone]   Fax:  [phone]  Occupational Therapy Treatment  Patient Details  Name: Madison Walsh MRN: [id number] Date of Birth: June 13, 1961 Referring Provider (OT): Arther Abbott, MD   Encounter Date: 07/05/2020   OT End of Session - 07/05/20 1704    Visit Number 14    Number of Visits 18    Date for OT Re-Evaluation 07/24/20    Authorization Type [employer]    Authorization Time Period 60 visit limit. No copay    Authorization - Visit Number 13    Authorization - Number of Visits 39    OT Start Time 1119    OT Stop Time 1200    OT Time Calculation (min) 41 min    Activity Tolerance Patient tolerated treatment well    Behavior During Therapy WFL for tasks assessed/performed           Past Medical History:  Diagnosis Date  . Constipation   . GERD (gastroesophageal reflux disease)   . PONV (postoperative nausea and vomiting)   . Ulcer of esophagus     Past Surgical History:  Procedure Laterality Date  . CERVICAL CONIZATION W/BX  1983  . COLONOSCOPY  06/27/2011   Procedure: COLONOSCOPY;  Surgeon: Rogene Houston, MD;  [facility];  Service: Endoscopy;  Laterality: N/A;  12:00  . ESOPHAGOGASTRODUODENOSCOPY N/A 08/10/2015   Procedure: ESOPHAGOGASTRODUODENOSCOPY (EGD);  Surgeon: Rogene Houston, MD;  Location: [facility];  Service: Endoscopy;  Laterality: N/A;  1:00  . FLEXOR TENOTOMY Right 05/09/2020   Procedure: Tenotomy flexor tendons right hand;  Surgeon: Carole Civil, MD;  Location: [facility];  Service: Orthopedics;  Laterality: Right;  . OPEN REDUCTION INTERNAL FIXATION (ORIF) DISTAL RADIAL FRACTURE Right 03/03/2020   Procedure: OPEN REDUCTION INTERNAL FIXATION (ORIF) RIGHT WRIST FRACTURE;  Surgeon: Carole Civil, MD;  Location: [facility];  Service: Orthopedics;  Laterality: Right;    There were no vitals filed for this visit.    Subjective Assessment - 07/05/20 1116    Subjective  Pain S: I can't really make a fist No/denies             [facility] OT Assessment - 07/05/20 1116      Assessment   Medical Diagnosis Tenotomy Flexor tendons      Precautions   Precautions Other (comment)    Precaution Comments Splint only at night, A/ROM AA/ROM P/ROM hand and wrist                    OT Treatments/Exercises (OP) - 07/05/20 1142      Exercises   Exercises Wrist;Hand;Theraputty      Weighted Stretch Over Towel Roll   Supination - Weighted Stretch 2 pounds;60 seconds    Pronation - Weighted Stretch 2 pounds;60 seconds    Wrist Flexion - Weighted Stretch 2 pounds;60 seconds    Wrist Extension - Weighted Stretch 2 pounds;60 seconds      Wrist Exercises   Wrist Flexion PROM;5 reps;AROM;10 reps    Wrist Extension PROM;5 reps;AROM;10 reps    Wrist Radial Deviation PROM;5 reps;AROM;10 reps    Wrist Ulnar Deviation PROM;5 reps;AROM;10 reps    Other wrist exercises supination/pronation PROM 5X and AROM 10 times       Additional Wrist Exercises   Sponges 13, 13, 14      Hand Exercises   Other Hand Exercises towel crumple, 10X  Other Hand Exercises Finger taps: 5X each      Manual Therapy   Manual Therapy Myofascial release;Other (comment)    Manual therapy comments manual therapy completed seperately from all other interventions    Myofascial Release myofascial release and manual stretching to decrease pain and restrictions and improve pain free mobility in right hand, wrist, and forearm                   OT Education - 07/05/20 1156    Education Details hand A/ROM exercises    Person(s) Educated Patient    Methods Explanation;Demonstration;Handout    Comprehension Verbalized understanding;Returned demonstration            OT Short Term Goals - 06/21/20 1221      OT SHORT TERM GOAL #1   Title Patient will be educated and independent with HEP in order to safely mobilize her right  hand in order to decrease risk of contracture and increase functional ROM.    Time 4    Period Weeks    Status --    Target Date 06/27/20      OT SHORT TERM GOAL #2   Title Patient will be provided a fabricated right hand dynamic splint in order to provided joint protection during healing phase of procedure while allowing for appropriate ROM.    Time 4    Period Weeks    Status --      OT SHORT TERM GOAL #3   Title Patient will improve RUE forearm, wrist, and hand P/ROM to WNL for improved functional use of RUE.    Time 4    Period Weeks    Status On-going      OT SHORT TERM GOAL #4   Title Patient will decrease pain to 3/10 or better in RUE.    Time 4    Period Weeks    Status --             OT Long Term Goals - 06/28/20 1334      OT LONG TERM GOAL #1   Title Patient will return to prior level of function using her RUE as dominant with daily tasks.    Time 8    Period Weeks    Status On-going      OT LONG TERM GOAL #2   Title Patient will improve RUE a/rom to [facility] for improved ability to write, drive, and complete desired daily tasks.    Time 8    Period Weeks    Status On-going      OT LONG TERM GOAL #3   Title Patient will improve RUE strength to 4/5 or better for improved ability to complete tasks such as picking up bags of groceries.    Time 8    Period Weeks    Status On-going      OT LONG TERM GOAL #4   Title Patient will improve RUE grip and pinch strength to [facility] in order to open up jars and containers.    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    Baseline 06/21/20 goal revised for a specific time using nine hole peg test    Time 8  Period Weeks    Status On-going                 Plan - 07/05/20 1157    Clinical Impression Statement  A: Continued with manual techniques and passive stretching, pt able to achieve straight fist after passive stretches. Increased weight to 2# for weighted wrist stretches. Added towel crumple and finger taps and updated for HEP. Pt reports she is not using her flexion glove currently as the MD did not say anything about wearing it. Continues to be limited in supination/pronation. Verbal cuing for form and technique.    Body Structure / Function / Physical Skills ADL;UE functional use;Fascial restriction;Pain;FMC;ROM;GMC;Coordination;IADL;Strength;Edema    Plan P: Continue with manual techniques and passive stretching. work on improving ability to make a full fist and improve her functional grasp    OT Home Exercise Plan eval: passive finger flexion with active extension. Splint education; 10/27: hand/finger A/ROM    Consulted and Agree with Plan of Care Patient           Patient will benefit from skilled therapeutic intervention in order to improve the following deficits and impairments:   Body Structure / Function / Physical Skills: ADL, UE functional use, Fascial restriction, Pain, FMC, ROM, GMC, Coordination, IADL, Strength, Edema       Visit Diagnosis: Other lack of coordination  Stiffness of right hand, not elsewhere classified  Pain in right hand  Other symptoms and signs involving the musculoskeletal system    Problem List Patient Active Problem List   Diagnosis Date Noted  . S/P carpal tunnel release and right hand flexor tenodesis multiple on 05/09/20 05/09/2020  . Contracture of joint of right forearm   . Complex regional pain syndrome i of right lower limb 04/28/2020  . S/P ORIF (open reduction internal fixation) fracture right wrist 03/03/20 03/07/2020  . Closed Barton's fracture of right radius   . Kyphosis of thoracic region 10/03/2017  . Erosive esophagitis 03/08/2013  . Unspecified constipation 03/08/2013   Guadelupe Sabin, OTR/L  [phone] 07/05/2020, 5:05  PM  [place] [address] [place], [place], [address] Phone: [phone]   Fax:  [phone]  Name: Madison Walsh MRN: [id number] Date of Birth: [birth year]

## 2020-07-05 NOTE — Patient Instructions (Signed)
Complete each exercise 10-15X, 2-3X/day  1) Towel crunch Place a small towel on a firm table top. Flatten out the towel and then place your hand on one end of it.  Next, flex your fingers 2-5 (index finger through pinky finger) as you pull the towel towards your hand.    2) Digit composite flexion/adduction (make a fist) Hold your hand up as shown. Open and close your hand into a fist and repeat. If you cannot make a full fist, then make a partial fist.     3) Finger Taps Start with the hand flat and fingers slightly spread.  One at a time, starting with the thumb, lift each finger up separately.

## 2020-07-10 ENCOUNTER — Ambulatory Visit (HOSPITAL_COMMUNITY): Payer: 59 | Attending: Orthopedic Surgery

## 2020-07-10 ENCOUNTER — Other Ambulatory Visit: Payer: Self-pay

## 2020-07-10 ENCOUNTER — Encounter (HOSPITAL_COMMUNITY): Payer: Self-pay

## 2020-07-10 DIAGNOSIS — M25641 Stiffness of right hand, not elsewhere classified: Secondary | ICD-10-CM

## 2020-07-10 DIAGNOSIS — M79641 Pain in right hand: Secondary | ICD-10-CM

## 2020-07-10 DIAGNOSIS — R29898 Other symptoms and signs involving the musculoskeletal system: Secondary | ICD-10-CM

## 2020-07-10 DIAGNOSIS — R278 Other lack of coordination: Secondary | ICD-10-CM | POA: Diagnosis not present

## 2020-07-10 NOTE — Therapy (Signed)
[place], [place], [address] Phone: [phone]   Fax:  [phone]  Occupational Therapy Treatment  Patient Details  Name: Madison Walsh MRN: [id number] Date of Birth: [birth year] Referring Provider (OT): Arther Abbott, MD   Encounter Date: 07/10/2020   OT End of Session - 07/10/20 1130    Visit Number 15    Number of Visits 18    Date for OT Re-Evaluation 07/24/20    Authorization Type [employer]    Authorization Time Period 60 visit limit. No copay    Authorization - Visit Number 14    Authorization - Number of Visits 36    OT Start Time 1116    OT Stop Time 1155    OT Time Calculation (min) 39 min    Activity Tolerance Patient tolerated treatment well    Behavior During Therapy WFL for tasks assessed/performed           Past Medical History:  Diagnosis Date  . Constipation   . GERD (gastroesophageal reflux disease)   . PONV (postoperative nausea and vomiting)   . Ulcer of esophagus     Past Surgical History:  Procedure Laterality Date  . CERVICAL CONIZATION W/BX  1983  . COLONOSCOPY  06/27/2011   Procedure: COLONOSCOPY;  Surgeon: Rogene Houston, MD;  [facility];  Service: Endoscopy;  Laterality: N/A;  12:00  . ESOPHAGOGASTRODUODENOSCOPY N/A 08/10/2015   Procedure: ESOPHAGOGASTRODUODENOSCOPY (EGD);  Surgeon: Rogene Houston, MD;  [facility];  Service: Endoscopy;  Laterality: N/A;  1:00  . FLEXOR TENOTOMY Right 05/09/2020   Procedure: Tenotomy flexor tendons right hand;  Surgeon: Carole Civil, MD;  Location: [facility];  Service: Orthopedics;  Laterality: Right;  . OPEN REDUCTION INTERNAL FIXATION (ORIF) DISTAL RADIAL FRACTURE Right 03/03/2020   Procedure: OPEN REDUCTION INTERNAL FIXATION (ORIF) RIGHT WRIST FRACTURE;  Surgeon: Carole Civil, MD;  Location: [facility];  Service: Orthopedics;  Laterality: Right;    There were no vitals filed for this visit.    Subjective Assessment - 07/10/20 1117    Subjective  S: I'm not having any pain out of the ordinary    Currently in Pain? No/denies              [facility] OT Assessment - 07/10/20 1127      Assessment   Medical Diagnosis Tenotomy Flexor tendons      Precautions   Precautions Other (comment)    Precaution Comments Splint only at night, A/ROM AA/ROM P/ROM hand and wrist                    OT Treatments/Exercises (OP) - 07/10/20 1118      Exercises   Exercises Wrist;Hand;Theraputty      Weighted Stretch Over Towel Roll   Supination - Weighted Stretch 2 pounds;60 seconds    Pronation - Weighted Stretch 2 pounds;60 seconds    Wrist Flexion - Weighted Stretch 2 pounds;60 seconds    Wrist Extension - Weighted Stretch 2 pounds;60 seconds      Wrist Exercises   Wrist Flexion PROM;5 reps;AROM;10 reps    Wrist Extension PROM;5 reps;AROM;10 reps    Wrist Radial Deviation PROM;5 reps;AROM;10 reps    Wrist Ulnar Deviation PROM;5 reps;AROM;10 reps    Other wrist exercises supination/pronation PROM 5X and AROM 10 times       Additional Wrist Exercises   Sponges 15, 15, 15      Hand  Exercises   Other Hand Exercises towel crumple, 10X    Other Hand Exercises Finger taps: 5X each      Manual Therapy   Manual Therapy Myofascial release;Other (comment)    Manual therapy comments manual therapy completed seperately from all other interventions    Myofascial Release myofascial release and manual stretching to decrease pain and restrictions and improve pain free mobility in right hand, wrist, and forearm       Fine Motor Coordination (Hand/Wrist)   Fine Motor Coordination Tendon glides    Tendon Glides 5 times with max verbal guidance                     OT Short Term Goals - 06/21/20 1221      OT SHORT TERM GOAL #1   Title Patient will be educated and independent with HEP in order to safely mobilize her right hand in order to decrease risk of contracture and  increase functional ROM.    Time 4    Period Weeks    Status --    Target Date 06/27/20      OT SHORT TERM GOAL #2   Title Patient will be provided a fabricated right hand dynamic splint in order to provided joint protection during healing phase of procedure while allowing for appropriate ROM.    Time 4    Period Weeks    Status --      OT SHORT TERM GOAL #3   Title Patient will improve RUE forearm, wrist, and hand P/ROM to WNL for improved functional use of RUE.    Time 4    Period Weeks    Status On-going      OT SHORT TERM GOAL #4   Title Patient will decrease pain to 3/10 or better in RUE.    Time 4    Period Weeks    Status --             OT Long Term Goals - 06/28/20 1334      OT LONG TERM GOAL #1   Title Patient will return to prior level of function using her RUE as dominant with daily tasks.    Time 8    Period Weeks    Status On-going      OT LONG TERM GOAL #2   Title Patient will improve RUE a/rom to [facility] for improved ability to write, drive, and complete desired daily tasks.    Time 8    Period Weeks    Status On-going      OT LONG TERM GOAL #3   Title Patient will improve RUE strength to 4/5 or better for improved ability to complete tasks such as picking up bags of groceries.    Time 8    Period Weeks    Status On-going      OT LONG TERM GOAL #4   Title Patient will improve RUE grip and pinch strength to [facility] in order to open up jars and containers.    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    Baseline 06/21/20 goal revised for a specific time using nine hole peg test    Time 8  Period Weeks    Status On-going                 Plan - 07/10/20 1156    Clinical Impression Statement A: Continued manual techniques to decrease  incision scar tissue. Patient demonstrated improvement in ability to make active fist this session followed by passive stretches, however is still unable to bring finger tips to palm. Continued towel crumples and grabbing sponges to promote functional fist movement. VC required for form and technique.    Body Structure / Function / Physical Skills ADL;UE functional use;Fascial restriction;Pain;FMC;ROM;GMC;Coordination;IADL;Strength;Edema    Plan P: Work on improving ability to make full fist and improve functional grasping    Consulted and Agree with Plan of Care Patient           Patient will benefit from skilled therapeutic intervention in order to improve the following deficits and impairments:   Body Structure / Function / Physical Skills: ADL, UE functional use, Fascial restriction, Pain, FMC, ROM, GMC, Coordination, IADL, Strength, Edema       Visit Diagnosis: Other lack of coordination  Stiffness of right hand, not elsewhere classified  Pain in right hand  Other symptoms and signs involving the musculoskeletal system    Problem List Patient Active Problem List   Diagnosis Date Noted  . S/P carpal tunnel release and right hand flexor tenodesis multiple on 05/09/20 05/09/2020  . Contracture of joint of right forearm   . Complex regional pain syndrome i of right lower limb 04/28/2020  . S/P ORIF (open reduction internal fixation) fracture right wrist 03/03/20 03/07/2020  . Closed Barton's fracture of right radius   . Kyphosis of thoracic region 10/03/2017  . Erosive esophagitis 03/08/2013  . Unspecified constipation 03/08/2013     [place], [place] Student [phone]   07/10/2020, 11:59 AM  [place] [address] [place], [place], [address] Phone: [phone]   Fax:  [phone]  Name: Madison Walsh MRN: [id number] Date of Birth: 04/27/1961

## 2020-07-12 ENCOUNTER — Other Ambulatory Visit: Payer: Self-pay

## 2020-07-12 ENCOUNTER — Encounter (HOSPITAL_COMMUNITY): Payer: Self-pay

## 2020-07-12 ENCOUNTER — Ambulatory Visit (HOSPITAL_COMMUNITY): Payer: 59

## 2020-07-12 DIAGNOSIS — R278 Other lack of coordination: Secondary | ICD-10-CM

## 2020-07-12 DIAGNOSIS — R29898 Other symptoms and signs involving the musculoskeletal system: Secondary | ICD-10-CM

## 2020-07-12 DIAGNOSIS — M79641 Pain in right hand: Secondary | ICD-10-CM

## 2020-07-12 DIAGNOSIS — M25641 Stiffness of right hand, not elsewhere classified: Secondary | ICD-10-CM

## 2020-07-12 NOTE — Therapy (Signed)
[facility] [place], [place], [address] Phone: [phone]   Fax:  [phone]  Occupational Therapy Treatment  Patient Details  Name: Madison Walsh MRN: [id number] Date of Birth: 1961-02-09 Referring Provider (OT): Arther Abbott, MD   Encounter Date: 07/12/2020   OT End of Session - 07/12/20 1152    Visit Number 16    Number of Visits 18    Date for OT Re-Evaluation 07/24/20    Authorization Type [employer]    Authorization Time Period 60 visit limit. No copay    Authorization - Visit Number 15    Authorization - Number of Visits 35    OT Start Time 6789    OT Stop Time 1157    OT Time Calculation (min) 42 min    Activity Tolerance Patient tolerated treatment well    Behavior During Therapy WFL for tasks assessed/performed           Past Medical History:  Diagnosis Date  . Constipation   . GERD (gastroesophageal reflux disease)   . PONV (postoperative nausea and vomiting)   . Ulcer of esophagus     Past Surgical History:  Procedure Laterality Date  . CERVICAL CONIZATION W/BX  1983  . COLONOSCOPY  06/27/2011   Procedure: COLONOSCOPY;  Surgeon: Rogene Houston, MD;  [facility];  Service: Endoscopy;  Laterality: N/A;  12:00  . ESOPHAGOGASTRODUODENOSCOPY N/A 08/10/2015   Procedure: ESOPHAGOGASTRODUODENOSCOPY (EGD);  Surgeon: Rogene Houston, MD;  [facility]: [facility];  Service: Endoscopy;  Laterality: N/A;  1:00  . FLEXOR TENOTOMY Right 05/09/2020   Procedure: Tenotomy flexor tendons right hand;  Surgeon: Carole Civil, MD;  Location: [facility];  Service: Orthopedics;  Laterality: Right;  . OPEN REDUCTION INTERNAL FIXATION (ORIF) DISTAL RADIAL FRACTURE Right 03/03/2020   Procedure: OPEN REDUCTION INTERNAL FIXATION (ORIF) RIGHT WRIST FRACTURE;  Surgeon: Carole Civil, MD;  Location: [facility];  Service: Orthopedics;  Laterality: Right;    There were no vitals filed for this visit.    Subjective Assessment - 07/12/20 1117    Subjective  S: Whatever you did last visit really helped the swelling to go down    Currently in Pain? Yes    Pain Score 2     Pain Location Wrist    Pain Orientation Right    Pain Descriptors / Indicators Sore    Pain Type Acute pain    Pain Radiating Towards n/a    Pain Onset More than a month ago    Pain Frequency Intermittent    Aggravating Factors  movement    Pain Relieving Factors meds    Effect of Pain on Daily Activities mod    Multiple Pain Sites No              [facility] OT Assessment - 07/12/20 1140      Assessment   Medical Diagnosis Tenotomy Flexor tendons      Precautions   Precautions Other (comment)    Precaution Comments Splint only at night, A/ROM AA/ROM P/ROM hand and wrist                    OT Treatments/Exercises (OP) - 07/12/20 1139      Exercises   Exercises Wrist;Hand      Weighted Stretch Over Towel Roll   Supination - Weighted Stretch 2 pounds;60 seconds   Moist heat applied during stretch   Pronation - Weighted Stretch 2 pounds;60  seconds   Moist heat applied during stretch   Wrist Flexion - Weighted Stretch 2 pounds;60 seconds   Moist heat applied during stretch   Wrist Extension - Weighted Stretch 2 pounds;60 seconds   Moist heat applied during stretch     Wrist Exercises   Wrist Flexion PROM;5 reps;AROM;10 reps    Wrist Extension PROM;5 reps;AROM;10 reps    Wrist Radial Deviation PROM;5 reps;AROM;10 reps    Wrist Ulnar Deviation PROM;5 reps;AROM;10 reps    Other wrist exercises supination/pronation PROM 5X and AROM 10 times    used towel in between body and elbow for form     Hand Exercises   Other Hand Exercises Using velcro board, patient worked on finger flexion and functional fist making to roll round piece number 3 from start of velcro strip to end of board. 5X. Attempted block piece number 1, for finger flexion however patient unable when strap placed distally on finger. Improved  with proximal placement    Other Hand Exercises Finger taps: 5X each      Modalities   Modalities Moist Heat      Moist Heat Therapy   Number Minutes Moist Heat 4 Minutes    Moist Heat Location Wrist      Manual Therapy   Manual Therapy Myofascial release    Manual therapy comments manual therapy completed seperately from all other interventions    Myofascial Release myofascial release and manual stretching to decrease pain and restrictions and improve pain free mobility in right hand, wrist, and forearm                     OT Short Term Goals - 06/21/20 1221      OT SHORT TERM GOAL #1   Title Patient will be educated and independent with HEP in order to safely mobilize her right hand in order to decrease risk of contracture and increase functional ROM.    Time 4    Period Weeks    Status --    Target Date 06/27/20      OT SHORT TERM GOAL #2   Title Patient will be provided a fabricated right hand dynamic splint in order to provided joint protection during healing phase of procedure while allowing for appropriate ROM.    Time 4    Period Weeks    Status --      OT SHORT TERM GOAL #3   Title Patient will improve RUE forearm, wrist, and hand P/ROM to WNL for improved functional use of RUE.    Time 4    Period Weeks    Status On-going      OT SHORT TERM GOAL #4   Title Patient will decrease pain to 3/10 or better in RUE.    Time 4    Period Weeks    Status --             OT Long Term Goals - 06/28/20 1334      OT LONG TERM GOAL #1   Title Patient will return to prior level of function using her RUE as dominant with daily tasks.    Time 8    Period Weeks    Status On-going      OT LONG TERM GOAL #2   Title Patient will improve RUE a/rom to [facility] for improved ability to write, drive, and complete desired daily tasks.    Time 8    Period Weeks    Status On-going  OT LONG TERM GOAL #3   Title Patient will improve RUE strength to 4/5 or better for  improved ability to complete tasks such as picking up bags of groceries.    Time 8    Period Weeks    Status On-going      OT LONG TERM GOAL #4   Title Patient will improve RUE grip and pinch strength to [facility] in order to open up jars and containers.    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    Baseline 06/21/20 goal revised for a specific time using nine hole peg test    Time 8    Period Weeks    Status On-going                 Plan - 07/12/20 1157    Clinical Impression Statement A: Continued manual techniques and prolonged stretch, however tried moist heat during prolonged stretch to promote comfort and increased mobility with patient reporting that she preferred the stretches with heat. Continued P/ROM and A/ROM of wrist and fingers. Had patient hold towel in between elbow and torso to promote better form during A/ROM forearm pronation and supination, with improvement in available ROM. Introduced Programmer, applications with patient using size 3 round piece to promote finger flexion, specifically DIP joint. Therapist stabilized at elbow, as patient had tendency to lift elbow and use arm to help pull. Attempted using block piece number 1 for isolated single digit finger flexion, however patient demonstrated difficulty with focusing on DIP joint. Ability improved with velcro piece placed more proximal on digit.    Body Structure / Function / Physical Skills ADL;UE functional use;Fascial restriction;Pain;FMC;ROM;GMC;Coordination;IADL;Strength;Edema    Plan P: Follow up on if patient felt like heat helped during prolonged stretch and continue if so, continue velcro board, functional grasping    Consulted and Agree with Plan of Care Patient           Patient will benefit from skilled  therapeutic intervention in order to improve the following deficits and impairments:   Body Structure / Function / Physical Skills: ADL, UE functional use, Fascial restriction, Pain, FMC, ROM, GMC, Coordination, IADL, Strength, Edema       Visit Diagnosis: Other lack of coordination  Stiffness of right hand, not elsewhere classified  Pain in right hand  Other symptoms and signs involving the musculoskeletal system    Problem List Patient Active Problem List   Diagnosis Date Noted  . S/P carpal tunnel release and right hand flexor tenodesis multiple on 05/09/20 05/09/2020  . Contracture of joint of right forearm   . Complex regional pain syndrome i of right lower limb 04/28/2020  . S/P ORIF (open reduction internal fixation) fracture right wrist 03/03/20 03/07/2020  . Closed Barton's fracture of right radius   . Kyphosis of thoracic region 10/03/2017  . Erosive esophagitis 03/08/2013  . Unspecified constipation 03/08/2013     [place], [place] Student [phone]   07/12/2020, 12:06 PM  [facility] [address] [place], [place], [address] Phone: [phone]   Fax:  [phone]  Name: DEBBY CLYNE MRN: [id number] Date of Birth: July 24, 1961

## 2020-07-17 ENCOUNTER — Encounter (HOSPITAL_COMMUNITY): Payer: Self-pay

## 2020-07-17 ENCOUNTER — Other Ambulatory Visit: Payer: Self-pay

## 2020-07-17 ENCOUNTER — Ambulatory Visit (HOSPITAL_COMMUNITY): Payer: 59

## 2020-07-17 DIAGNOSIS — R278 Other lack of coordination: Secondary | ICD-10-CM | POA: Diagnosis not present

## 2020-07-17 DIAGNOSIS — M25641 Stiffness of right hand, not elsewhere classified: Secondary | ICD-10-CM

## 2020-07-17 DIAGNOSIS — M79641 Pain in right hand: Secondary | ICD-10-CM

## 2020-07-17 DIAGNOSIS — R29898 Other symptoms and signs involving the musculoskeletal system: Secondary | ICD-10-CM

## 2020-07-17 NOTE — Therapy (Signed)
[facility] [place], [place], [address] Phone: [phone]   Fax:  [phone]  Occupational Therapy Treatment  Patient Details  Name: Madison Walsh MRN: [id number] Date of Birth: 28-Feb-1961 Referring Provider (OT): Arther Abbott, MD   Encounter Date: 07/17/2020   OT End of Session - 07/17/20 1504    Visit Number 17    Number of Visits 18    Date for OT Re-Evaluation 07/24/20    Authorization Type [employer]    Authorization Time Period 60 visit limit. No copay    Authorization - Visit Number 15    Authorization - Number of Visits 16    OT Start Time 0938   pt checked in late   OT Stop Time 1513    OT Time Calculation (min) 38 min    Activity Tolerance Patient tolerated treatment well    Behavior During Therapy WFL for tasks assessed/performed           Past Medical History:  Diagnosis Date  . Constipation   . GERD (gastroesophageal reflux disease)   . PONV (postoperative nausea and vomiting)   . Ulcer of esophagus     Past Surgical History:  Procedure Laterality Date  . CERVICAL CONIZATION W/BX  1983  . COLONOSCOPY  06/27/2011   Procedure: COLONOSCOPY;  Surgeon: Rogene Houston, MD;  [facility];  Service: Endoscopy;  Laterality: N/A;  12:00  . ESOPHAGOGASTRODUODENOSCOPY N/A 08/10/2015   Procedure: ESOPHAGOGASTRODUODENOSCOPY (EGD);  Surgeon: Rogene Houston, MD;  [facility];  Service: Endoscopy;  Laterality: N/A;  1:00  . FLEXOR TENOTOMY Right 05/09/2020   Procedure: Tenotomy flexor tendons right hand;  Surgeon: Carole Civil, MD;  Location: [facility];  Service: Orthopedics;  Laterality: Right;  . OPEN REDUCTION INTERNAL FIXATION (ORIF) DISTAL RADIAL FRACTURE Right 03/03/2020   Procedure: OPEN REDUCTION INTERNAL FIXATION (ORIF) RIGHT WRIST FRACTURE;  Surgeon: Carole Civil, MD;  Location: [facility];  Service: Orthopedics;  Laterality: Right;    There were no vitals filed  for this visit.   Subjective Assessment - 07/17/20 1436    Subjective  S: Look I can touch my pinky!!    Currently in Pain? No/denies              [facility] OT Assessment - 07/17/20 1503      Assessment   Medical Diagnosis Tenotomy Flexor tendons      Precautions   Precautions Other (comment)    Precaution Comments Splint only at night, A/ROM AA/ROM P/ROM hand and wrist                    OT Treatments/Exercises (OP) - 07/17/20 1437      Exercises   Exercises Wrist;Hand      Weighted Stretch Over Towel Roll   Supination - Weighted Stretch 2 pounds;90 seconds   moist heat applied   Pronation - Weighted Stretch 2 pounds;90 seconds   moist heat applied   Wrist Flexion - Weighted Stretch 2 pounds;90 seconds   moist heat applied   Wrist Extension - Weighted Stretch 2 pounds;90 seconds   moist heat applied     Wrist Exercises   Wrist Flexion PROM;5 reps;AROM;Other reps (comment)   12 reps   Wrist Extension PROM;5 reps;AROM;Other reps (comment)   12 reps   Wrist Radial Deviation PROM;5 reps;AROM;Other reps (comment)   12 reps   Wrist Ulnar Deviation PROM;5 reps;AROM;Other reps (comment)   12  reps   Other wrist exercises supination/pronation PROM 5X and AROM 12 times       Additional Wrist Exercises   Sponges 11, 13, 12      Hand Exercises   Other Hand Exercises Velcro board, round piece number 3 for finger flexion. 7X from one end of board to the other.     Other Hand Exercises Finger taps: 5X each      Modalities   Modalities Moist Heat      Moist Heat Therapy   Number Minutes Moist Heat 5.5 Minutes    Moist Heat Location Wrist                    OT Short Term Goals - 06/21/20 1221      OT SHORT TERM GOAL #1   Title Patient will be educated and independent with HEP in order to safely mobilize her right hand in order to decrease risk of contracture and increase functional ROM.    Time 4    Period Weeks    Status --    Target Date 06/27/20       OT SHORT TERM GOAL #2   Title Patient will be provided a fabricated right hand dynamic splint in order to provided joint protection during healing phase of procedure while allowing for appropriate ROM.    Time 4    Period Weeks    Status --      OT SHORT TERM GOAL #3   Title Patient will improve RUE forearm, wrist, and hand P/ROM to WNL for improved functional use of RUE.    Time 4    Period Weeks    Status On-going      OT SHORT TERM GOAL #4   Title Patient will decrease pain to 3/10 or better in RUE.    Time 4    Period Weeks    Status --             OT Long Term Goals - 06/28/20 1334      OT LONG TERM GOAL #1   Title Patient will return to prior level of function using her RUE as dominant with daily tasks.    Time 8    Period Weeks    Status On-going      OT LONG TERM GOAL #2   Title Patient will improve RUE a/rom to [facility] for improved ability to write, drive, and complete desired daily tasks.    Time 8    Period Weeks    Status On-going      OT LONG TERM GOAL #3   Title Patient will improve RUE strength to 4/5 or better for improved ability to complete tasks such as picking up bags of groceries.    Time 8    Period Weeks    Status On-going      OT LONG TERM GOAL #4   Title Patient will improve RUE grip and pinch strength to [facility] in order to open up jars and containers.    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    Baseline 06/21/20 goal revised for a specific time using nine hole peg test    Time 8  Period Weeks    Status On-going                 Plan - 07/17/20 1507    Clinical Impression Statement A: Patient reported she was able to bring her pinky to her thumb for the first time yesterday. Continued with moist heat during prolonged stretch,  with patient progressing to 90" for weighted stretch. Therapist applying assist at thumb for functional grasp with supination and pronation stretches. Progressed velcro board reps to 7X this date.    Body Structure / Function / Physical Skills ADL;UE functional use;Fascial restriction;Pain;FMC;ROM;GMC;Coordination;IADL;Strength;Edema    Plan P: Re-evaluation, continue moist heat for prolonged stretch and functional grasping activities    Consulted and Agree with Plan of Care Patient           Patient will benefit from skilled therapeutic intervention in order to improve the following deficits and impairments:   Body Structure / Function / Physical Skills: ADL, UE functional use, Fascial restriction, Pain, FMC, ROM, GMC, Coordination, IADL, Strength, Edema       Visit Diagnosis: Other lack of coordination  Stiffness of right hand, not elsewhere classified  Pain in right hand  Other symptoms and signs involving the musculoskeletal system    Problem List Patient Active Problem List   Diagnosis Date Noted  . S/P carpal tunnel release and right hand flexor tenodesis multiple on 05/09/20 05/09/2020  . Contracture of joint of right forearm   . Complex regional pain syndrome i of right lower limb 04/28/2020  . S/P ORIF (open reduction internal fixation) fracture right wrist 03/03/20 03/07/2020  . Closed Barton's fracture of right radius   . Kyphosis of thoracic region 10/03/2017  . Erosive esophagitis 03/08/2013  . Unspecified constipation 03/08/2013     [place], [place] Student [phone]   07/17/2020, 3:13 PM  [facility] [address] [place], [place], [address] Phone: [phone]   Fax:  [phone]  Name: Madison Walsh MRN: [id number] Date of Birth: 12-Aug-1961

## 2020-07-19 ENCOUNTER — Ambulatory Visit (INDEPENDENT_AMBULATORY_CARE_PROVIDER_SITE_OTHER): Payer: 59 | Admitting: Orthopedic Surgery

## 2020-07-19 ENCOUNTER — Ambulatory Visit (HOSPITAL_COMMUNITY): Payer: 59 | Admitting: Occupational Therapy

## 2020-07-19 ENCOUNTER — Encounter: Payer: Self-pay | Admitting: Orthopedic Surgery

## 2020-07-19 ENCOUNTER — Other Ambulatory Visit: Payer: Self-pay

## 2020-07-19 ENCOUNTER — Encounter (HOSPITAL_COMMUNITY): Payer: Self-pay | Admitting: Occupational Therapy

## 2020-07-19 DIAGNOSIS — M25641 Stiffness of right hand, not elsewhere classified: Secondary | ICD-10-CM

## 2020-07-19 DIAGNOSIS — R278 Other lack of coordination: Secondary | ICD-10-CM | POA: Diagnosis not present

## 2020-07-19 DIAGNOSIS — M79641 Pain in right hand: Secondary | ICD-10-CM

## 2020-07-19 DIAGNOSIS — Z9889 Other specified postprocedural states: Secondary | ICD-10-CM

## 2020-07-19 DIAGNOSIS — Z8781 Personal history of (healed) traumatic fracture: Secondary | ICD-10-CM

## 2020-07-19 DIAGNOSIS — G90511 Complex regional pain syndrome I of right upper limb: Secondary | ICD-10-CM

## 2020-07-19 DIAGNOSIS — R29898 Other symptoms and signs involving the musculoskeletal system: Secondary | ICD-10-CM

## 2020-07-19 NOTE — Patient Instructions (Signed)
Tendon Gliding Exercises: Complete 10X, hold for 3-5 seconds, 1-2x per day  1) Straight: begin with wrist in extended position and fingers straight      2) Hook: Bend your fingers making them look like a hook while keeping your thumb straight.      3) Fist: Make your hand into a fist.      4) Table Top: Straighten your fingers straight out making them look like a table top.      5) Straight Fist: Bend your fingers straight down into a straight fist.

## 2020-07-19 NOTE — Therapy (Signed)
[place], [place], [address] Phone: [phone]   Fax:  [phone]  Occupational Therapy Reassessment, Treatment (recertification)  Patient Details  Name: Madison Walsh MRN: [id number] Date of Birth: 01-10-1961 Referring Provider (OT): Arther Abbott, MD   Encounter Date: 07/19/2020   OT End of Session - 07/19/20 0949    Visit Number 18    Number of Visits 24    Date for OT Re-Evaluation 08/09/20    Authorization Type [employer]    Authorization Time Period 60 visit limit. No copay    Authorization - Visit Number 16    Authorization - Number of Visits 51    OT Start Time [phone]    OT Stop Time 0945    OT Time Calculation (min) 41 min    Activity Tolerance Patient tolerated treatment well    Behavior During Therapy [facility] for tasks assessed/performed            [facility] OT Assessment - 07/19/20 0904      Assessment   Medical Diagnosis Tenotomy Flexor tendons      Precautions   Precautions Other (comment)    Precaution Comments Splint only at night, A/ROM AA/ROM P/ROM hand and wrist      Coordination   Right 9 Hole Peg Test 38.68"   1'01" previous     AROM   Right/Left Forearm Right    Right Forearm Pronation 75 Degrees   50 previous   Right Forearm Supination 80 Degrees   48 previous   Right/Left Wrist Right    Right Wrist Extension 36 Degrees   20 previous   Right Wrist Flexion 40 Degrees   32 previous   Right Wrist Radial Deviation 30 Degrees   24 previous   Right Wrist Ulnar Deviation 15 Degrees   0 previous   Right Thumb Opposition Digit 2;Digit 3;Digit 4;Digit 5   digits 2, 3 previous     PROM   Right Forearm Pronation 90 Degrees   52 previous   Right Forearm Supination 65 Degrees   same as previous   Right Wrist Extension 42 Degrees   36 previous   Right Wrist Flexion 50 Degrees   40 previous   Right Wrist Radial Deviation 34 Degrees   same as previous   Right Wrist Ulnar Deviation 15  Degrees   10 previous     Strength   Strength Assessment Site Forearm;Wrist;Hand    Right/Left Forearm Right    Right Forearm Pronation 4+/5   not previously assessed   Right Forearm Supination 4+/5   not previously assessed   Right/Left Wrist Right    Right Wrist Flexion 4/5   not previously assessed   Right Wrist Extension 4/5   not previously assessed   Right Wrist Radial Deviation 5/5   not previously assessed   Right Wrist Ulnar Deviation 4/5   not previously assessed   Right/Left hand Right;Left    Right Hand Grip (lbs) 12   not previously assessed   Right Hand Lateral Pinch 6 lbs   not previously assessed   Right Hand 3 Point Pinch 4 lbs   not previously assessed   Left Hand Grip (lbs) 35   not previously assessed   Left Hand Lateral Pinch 16 lbs   not previously assessed   Left Hand 3 Point Pinch 15 lbs   not previously assessed     Right Hand AROM   R Thumb MCP 0-60 50  Degrees   same as previous   R Thumb IP 0-80 35 Degrees   42 previous   R Index  MCP 0-90 62 Degrees   64 previous   R Index PIP 0-100 80 Degrees   62 previous   R Index DIP 0-70 18 Degrees   0 previous   R Long  MCP 0-90 74 Degrees   70 previous   R Long PIP 0-100 80 Degrees   64 previous   R Long DIP 0-70 10 Degrees   0 previous   R Ring  MCP 0-90 62 Degrees   52 previous   R Ring PIP 0-100 80 Degrees   72 previous   R Ring DIP 0-70 16 Degrees   0 previous   R Little  MCP 0-90 48 Degrees   56 previous   R Little PIP 0-100 74 Degrees   76 previous   R Little DIP 0-70 16 Degrees   30 previous     Right Hand PROM   R Thumb MCP 0-60 70 Degrees   64    R Thumb IP 0-80 55 Degrees   44   R Index  MCP 0-90 72 Degrees   70   R Index PIP 0-100 62 Degrees   76   R Index DIP 0-70 48 Degrees   42   R Long  MCP 0-90 58 Degrees   80   R Long PIP 0-100 78 Degrees   78   R Long DIP 0-70 55 Degrees   35   R Ring  MCP 0-90 72 Degrees   72   R Ring PIP 0-100 80 Degrees   78   R Ring DIP 0-70 50 Degrees   70   R  Little  MCP 0-90 68 Degrees   78   R Little PIP 0-100 80 Degrees   90   R Little DIP 0-70 50 Degrees   60              Past Medical History:  Diagnosis Date  . Constipation   . GERD (gastroesophageal reflux disease)   . PONV (postoperative nausea and vomiting)   . Ulcer of esophagus     Past Surgical History:  Procedure Laterality Date  . CERVICAL CONIZATION W/BX  1983  . COLONOSCOPY  06/27/2011   Procedure: COLONOSCOPY;  Surgeon: Rogene Houston, MD;  [facility];  Service: Endoscopy;  Laterality: N/A;  12:00  . ESOPHAGOGASTRODUODENOSCOPY N/A 08/10/2015   Procedure: ESOPHAGOGASTRODUODENOSCOPY (EGD);  Surgeon: Rogene Houston, MD;  Location: [facility];  Service: Endoscopy;  Laterality: N/A;  1:00  . FLEXOR TENOTOMY Right 05/09/2020   Procedure: Tenotomy flexor tendons right hand;  Surgeon: Carole Civil, MD;  Location: [facility];  Service: Orthopedics;  Laterality: Right;  . OPEN REDUCTION INTERNAL FIXATION (ORIF) DISTAL RADIAL FRACTURE Right 03/03/2020   Procedure: OPEN REDUCTION INTERNAL FIXATION (ORIF) RIGHT WRIST FRACTURE;  Surgeon: Carole Civil, MD;  Location: [facility];  Service: Orthopedics;  Laterality: Right;    There were no vitals filed for this visit.   Subjective Assessment - 07/19/20 0904    Subjective  S: I took the splint off at night about a week ago.    Currently in Pain? No/denies               Katina Dung - 07/19/20 4782    Open a tight or new jar Unable    Do heavy household chores (wash walls,  wash floors) Mild difficulty    Carry a shopping bag or briefcase Severe difficulty    Wash your back Unable    Use a knife to cut food Unable    Recreational activities in which you take some force or impact through your arm, shoulder, or hand (golf, hammering, tennis) Unable    During the past week, to what extent has your arm, shoulder or hand problem interfered with your normal social activities with family, friends, neighbors,  or groups? Not at all    During the past week, to what extent has your arm, shoulder or hand problem limited your work or other regular daily activities Slightly    Arm, shoulder, or hand pain. Moderate    Tingling (pins and needles) in your arm, shoulder, or hand Mild    Difficulty Sleeping No difficulty    DASH Score 54.55 %                OT Treatments/Exercises (OP) - 07/19/20 0907      Exercises   Exercises Wrist;Hand      Weighted Stretch Over Towel Roll   Supination - Weighted Stretch 2 pounds;90 seconds      Wrist Exercises   Wrist Flexion PROM;5 reps    Wrist Extension PROM;5 reps    Wrist Radial Deviation PROM;5 reps    Wrist Ulnar Deviation PROM;5 reps      Hand Exercises   MCPJ Flexion PROM;5 reps    MCPJ Extension PROM;5 reps    PIPJ Flexion PROM;5 reps    PIPJ Extension PROM;5 reps    DIPJ Flexion PROM;5 reps    DIPJ Extension PROM;5 reps      Fine Motor Coordination (Hand/Wrist)   Fine Motor Coordination Tendon glides    Tendon Glides 10X with HEP instructions for reference                  OT Education - 07/19/20 0930    Education Details tendon glides; recommended pt resume wearing flexion glove 2-3x/day for 30 minutes at a time.    Person(s) Educated Patient    Methods Explanation;Demonstration;Handout    Comprehension Verbalized understanding;Returned demonstration            OT Short Term Goals - 07/19/20 0947      OT SHORT TERM GOAL #1   Title Patient will be educated and independent with HEP in order to safely mobilize her right hand in order to decrease risk of contracture and increase functional ROM.    Time 4    Period Weeks    Status Achieved    Target Date 06/27/20      OT SHORT TERM GOAL #2   Title Patient will be provided a fabricated right hand dynamic splint in order to provided joint protection during healing phase of procedure while allowing for appropriate ROM.    Time 4    Period Weeks    Status Achieved        OT SHORT TERM GOAL #3   Title Patient will improve RUE forearm, wrist, and hand P/ROM to WNL for improved functional use of RUE.    Time 4    Period Weeks    Status On-going      OT SHORT TERM GOAL #4   Title Patient will decrease pain to 3/10 or better in RUE.    Time 4    Period Weeks    Status Achieved  OT Long Term Goals - 07/19/20 0941      OT LONG TERM GOAL #1   Title Patient will return to prior level of function using her RUE as dominant with daily tasks.    Time 8    Period Weeks    Status On-going      OT LONG TERM GOAL #2   Title Patient will improve RUE a/rom to [facility] for improved ability to write, drive, and complete desired daily tasks.    Time 8    Period Weeks    Status On-going      OT LONG TERM GOAL #3   Title Patient will improve RUE strength to 4/5 or better for improved ability to complete tasks such as picking up bags of groceries.    Time 8    Period Weeks    Status Achieved      OT LONG TERM GOAL #4   Title Patient will improve RUE grip and pinch strength to [facility] in order to open up jars and containers.    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    Baseline 06/21/20 goal revised for a specific time using nine hole peg test    Time 8    Period Weeks    Status Achieved                 Plan - 07/19/20 0949    Clinical Impression Statement A: Reassessment completed this session. Pt has met 3/4 STGs and 2/5 LTGs making progress in the areas of ROM, strength, and coordination of the right hand and wrist. Measured wrist strength, grip and pinch strength for the first time today. Pt continues to be limited in digit flexion and unable to make a full fist, however improves with passive stretching. Discussed resuming use of  flexion glove daily and updated HEP for tendon gliding exercises. Pt has made small progress with wrist ROM, will begin to focus more on hand and digit ROM and strength as pt may be plateauing with wrist ROM. Pt reports it is getting easier to use the RUE during ADLs, is still unable to cut food, grasp items, and manipulate items during daily tasks.    OT Occupational Profile and History Problem Focused Assessment - Including review of records relating to presenting problem    Occupational performance deficits (Please refer to evaluation for details): ADL's;IADL's;Rest and Sleep;Leisure    Body Structure / Function / Physical Skills ADL;UE functional use;Fascial restriction;Pain;FMC;ROM;GMC;Coordination;IADL;Strength;Edema    Rehab Potential Good    Clinical Decision Making Several treatment options, min-mod task modification necessary    Comorbidities Affecting Occupational Performance: May have comorbidities impacting occupational performance    Modification or Assistance to Complete Evaluation  No modification of tasks or assist necessary to complete eval    OT Frequency 2x / week    OT Duration --   3 weeks   OT Treatment/Interventions Self-care/ADL training;Electrical Stimulation;Therapeutic exercise;Patient/family education;Splinting;Neuromuscular education;Moist Heat;Energy conservation;Scar mobilization;Therapeutic activities;Passive range of motion;Manual Therapy;DME and/or AE instruction;Ultrasound;Cryotherapy;Contrast Bath    Plan P: Continue skilled OT services to address ROM, strength, and functional use of right hand and wrist. Next session: follow up on MD appt and flexion glove use, review tendon glides  and work on Product/process development scientist tasks    OT Home Exercise Plan eval: passive finger flexion with active extension. Splint education; 10/27: hand/finger A/ROM; 11/10: tendon glides, educated on flexion glove use    Consulted and Agree with Plan of Care Patient           Patient will  benefit from skilled therapeutic intervention in order to improve the following deficits and impairments:   Body Structure / Function / Physical Skills: ADL, UE functional use, Fascial restriction, Pain, FMC, ROM, GMC, Coordination, IADL, Strength, Edema       Visit Diagnosis: Other lack of coordination  Stiffness of right hand, not elsewhere classified  Pain in right hand  Other symptoms and signs involving the musculoskeletal system    Problem List Patient Active Problem List   Diagnosis Date Noted  . S/P carpal tunnel release and right hand flexor tenodesis multiple on 05/09/20 05/09/2020  . Contracture of joint of right forearm   . Complex regional pain syndrome i of right lower limb 04/28/2020  . S/P ORIF (open reduction internal fixation) fracture right wrist 03/03/20 03/07/2020  . Closed Barton's fracture of right radius   . Kyphosis of thoracic region 10/03/2017  . Erosive esophagitis 03/08/2013  . Unspecified constipation 03/08/2013   Guadelupe Sabin, OTR/L  [phone] 07/19/2020, 9:55 AM  [facility] [address] [place], [place], [address] Phone: [phone]   Fax:  [phone]  Name: Madison Walsh MRN: [id number] Date of Birth: 09/28/1960

## 2020-07-19 NOTE — Progress Notes (Signed)
Chief Complaint  Patient presents with  . Routine Post Op    05/09/20 flexor tendon releases / CTR     Encounter Diagnoses  Name Primary?  . S/P carpal tunnel release and right hand flexor tenodesis multiple on 05/09/20 Yes  . S/P ORIF (open reduction internal fixation) fracture right wrist 03/03/20   . Complex regional pain syndrome type 1 of right upper extremity    Madison Walsh continues to improve she can now oppose the thumb to the small finger her RSD symptoms are getting better she has no numbness or tingling she does have a little bit of temperature changes here and there  She still has some pronation deficits but that is better as well she is starting to do some typing at home  I want her to continue her physical therapy and come back in 4 weeks

## 2020-07-24 ENCOUNTER — Encounter (HOSPITAL_COMMUNITY): Payer: Self-pay

## 2020-07-24 ENCOUNTER — Ambulatory Visit (HOSPITAL_COMMUNITY): Payer: 59

## 2020-07-24 ENCOUNTER — Other Ambulatory Visit: Payer: Self-pay

## 2020-07-24 DIAGNOSIS — R29898 Other symptoms and signs involving the musculoskeletal system: Secondary | ICD-10-CM

## 2020-07-24 DIAGNOSIS — M25641 Stiffness of right hand, not elsewhere classified: Secondary | ICD-10-CM

## 2020-07-24 DIAGNOSIS — R278 Other lack of coordination: Secondary | ICD-10-CM

## 2020-07-24 DIAGNOSIS — M79641 Pain in right hand: Secondary | ICD-10-CM

## 2020-07-24 NOTE — Therapy (Signed)
[place], [place], [address] Phone: [phone]   Fax:  [phone]  Occupational Therapy Treatment  Patient Details  Name: Madison Walsh MRN: [id number] Date of Birth: 1961-06-22 Referring Provider (OT): Arther Abbott, MD   Encounter Date: 07/24/2020   OT End of Session - 07/24/20 1216    Visit Number 19    Number of Visits 24    Date for OT Re-Evaluation 08/09/20    Authorization Type [employer]    Authorization Time Period 60 visit limit. No copay    Authorization - Visit Number 17    Authorization - Number of Visits 37    OT Start Time [phone]    OT Stop Time 1027    OT Time Calculation (min) 41 min    Activity Tolerance Patient tolerated treatment well    Behavior During Therapy WFL for tasks assessed/performed           Past Medical History:  Diagnosis Date  . Constipation   . GERD (gastroesophageal reflux disease)   . PONV (postoperative nausea and vomiting)   . Ulcer of esophagus     Past Surgical History:  Procedure Laterality Date  . CERVICAL CONIZATION W/BX  1983  . COLONOSCOPY  06/27/2011   Procedure: COLONOSCOPY;  Surgeon: Rogene Houston, MD;  [facility];  Service: Endoscopy;  Laterality: N/A;  12:00  . ESOPHAGOGASTRODUODENOSCOPY N/A 08/10/2015   Procedure: ESOPHAGOGASTRODUODENOSCOPY (EGD);  Surgeon: Rogene Houston, MD;  [facility];  Service: Endoscopy;  Laterality: N/A;  1:00  . FLEXOR TENOTOMY Right 05/09/2020   Procedure: Tenotomy flexor tendons right hand;  Surgeon: Carole Civil, MD;  Location: [facility];  Service: Orthopedics;  Laterality: Right;  . OPEN REDUCTION INTERNAL FIXATION (ORIF) DISTAL RADIAL FRACTURE Right 03/03/2020   Procedure: OPEN REDUCTION INTERNAL FIXATION (ORIF) RIGHT WRIST FRACTURE;  Surgeon: Carole Civil, MD;  Location: [facility];  Service: Orthopedics;  Laterality: Right;    There were no vitals filed for this visit.    Subjective Assessment - 07/24/20 1007    Subjective  S: It's hard to get the glove to flex these joints (DIP).    Currently in Pain? No/denies              [facility] OT Assessment - 07/24/20 1008      Assessment   Medical Diagnosis Tenotomy Flexor tendons    Next MD Visit --   4 weeks     Precautions   Precautions Other (comment)    Precaution Comments Splint only at night, A/ROM AA/ROM P/ROM hand and wrist                    OT Treatments/Exercises (OP) - 07/24/20 1008      Exercises   Exercises Wrist;Hand      Additional Wrist Exercises   Sponges 12,10 (soft)   8, 8 (hard)     Hand Exercises   Other Hand Exercises Utilizing yellow clothespin and a 3 point pinch, patient picked up 10 sponges and placed in container.     Other Hand Exercises Yellow theraputty: Thumb flexion, Digits 2-4 composite flexion, 3 point pinch; all completed with 10 second decent. completed for 2' each.      Fine Motor Coordination (Hand/Wrist)   Manipulation of small objects Initialled attempted in hand manipulation with sequence chips although unsuccessful. Transitioned to using Connect 4 checker pieces for greater success. Completed 10 times, pt  picked up 2 checkers one at time transfering from finger tip to palm then back to palm. Also completed same technique to stack all pieces into one single tower.                  OT Education - 07/24/20 1214    Education Details provided yellow theraputy - verbal education to complete same three activities from today's session (thumb flexion, 3 point pinch, and compsite digit flexion at a slow controlled pace for 2 minutes each). Pt provided with 1 inch coban and therapist demonstrated wrapping of composite DIP joints to work on flexion. Complete after use of flexion glove for 30 minutes.    Person(s) Educated Patient    Methods Explanation;Demonstration;Verbal cues    Comprehension Verbalized understanding            OT Short Term Goals  - 07/24/20 1220      OT SHORT TERM GOAL #1   Title Patient will be educated and independent with HEP in order to safely mobilize her right hand in order to decrease risk of contracture and increase functional ROM.    Time 4    Period Weeks    Target Date 06/27/20      OT SHORT TERM GOAL #2   Title Patient will be provided a fabricated right hand dynamic splint in order to provided joint protection during healing phase of procedure while allowing for appropriate ROM.    Time 4    Period Weeks      OT SHORT TERM GOAL #3   Title Patient will improve RUE forearm, wrist, and hand P/ROM to WNL for improved functional use of RUE.    Time 4    Period Weeks    Status On-going      OT SHORT TERM GOAL #4   Title Patient will decrease pain to 3/10 or better in RUE.    Time 4    Period Weeks             OT Long Term Goals - 07/24/20 1221      OT LONG TERM GOAL #1   Title Patient will return to prior level of function using her RUE as dominant with daily tasks.    Time 8    Period Weeks    Status On-going      OT LONG TERM GOAL #2   Title Patient will improve RUE a/rom to [facility] for improved ability to write, drive, and complete desired daily tasks.    Time 8    Period Weeks    Status On-going      OT LONG TERM GOAL #3   Title Patient will improve RUE strength to 4/5 or better for improved ability to complete tasks such as picking up bags of groceries.    Time 8    Period Weeks      OT LONG TERM GOAL #4   Title Patient will improve RUE grip and pinch strength to [facility] in order to open up jars and containers.    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    Baseline 06/21/20 goal revised for a specific time using nine hole peg test    Time 8  Period Weeks                  Plan - 07/24/20 1218    Clinical Impression Statement A: Education provided on technique to work on increasing DIP flexion when flexion glove does not provide enough passive stretch. patient reports she is wearing her glove as recommended. MD appointment went well. Nothing new to add. Return to MD in 4 weeks. Pt demonstrated increased difficulty with in hand manipulation when presented with sequence pieces due to decreased finger flexion. Paraffin wax not available to use during session to aid with passive stretch.    Body Structure / Function / Physical Skills ADL;UE functional use;Fascial restriction;Pain;FMC;ROM;GMC;Coordination;IADL;Strength;Edema    Plan P: Try use of paraffin next session. use coban to provided additional passive stretching while using paraffin.    Consulted and Agree with Plan of Care Patient           Patient will benefit from skilled therapeutic intervention in order to improve the following deficits and impairments:   Body Structure / Function / Physical Skills: ADL, UE functional use, Fascial restriction, Pain, FMC, ROM, GMC, Coordination, IADL, Strength, Edema       Visit Diagnosis: Other lack of coordination  Stiffness of right hand, not elsewhere classified  Other symptoms and signs involving the musculoskeletal system  Pain in right hand    Problem List Patient Active Problem List   Diagnosis Date Noted  . S/P carpal tunnel release and right hand flexor tenodesis multiple on 05/09/20 05/09/2020  . Contracture of joint of right forearm   . Complex regional pain syndrome i of right lower limb 04/28/2020  . S/P ORIF (open reduction internal fixation) fracture right wrist 03/03/20 03/07/2020  . Closed Barton's fracture of right radius   . Kyphosis of thoracic region 10/03/2017  . Erosive esophagitis 03/08/2013  . Unspecified constipation 03/08/2013   Ailene Ravel, OTR/L,CBIS  [phone]  07/24/2020, 12:21 PM  [facility] [address] [place], [place], [address] Phone: [phone]   Fax:  [phone]  Name: AALEEYAH BIAS MRN: [id number] Date of Birth: [birth year]

## 2020-07-26 ENCOUNTER — Encounter (HOSPITAL_COMMUNITY): Payer: Self-pay | Admitting: Occupational Therapy

## 2020-07-26 ENCOUNTER — Other Ambulatory Visit: Payer: Self-pay

## 2020-07-26 ENCOUNTER — Ambulatory Visit (HOSPITAL_COMMUNITY): Payer: 59 | Admitting: Occupational Therapy

## 2020-07-26 DIAGNOSIS — M25641 Stiffness of right hand, not elsewhere classified: Secondary | ICD-10-CM

## 2020-07-26 DIAGNOSIS — R278 Other lack of coordination: Secondary | ICD-10-CM

## 2020-07-26 DIAGNOSIS — M79641 Pain in right hand: Secondary | ICD-10-CM

## 2020-07-26 DIAGNOSIS — R29898 Other symptoms and signs involving the musculoskeletal system: Secondary | ICD-10-CM

## 2020-07-26 NOTE — Therapy (Signed)
[place], [place], [address] Phone: [phone]   Fax:  [phone]  Occupational Therapy Treatment  Patient Details  Name: Madison Walsh MRN: [id number] Date of Birth: [birth year] Referring Provider (OT): Arther Abbott, MD   Encounter Date: 07/26/2020   OT End of Session - 07/26/20 1154    Visit Number 20    Number of Visits 24    Date for OT Re-Evaluation 08/09/20    Authorization Type [employer]    Authorization Time Period 60 visit limit. No copay    Authorization - Visit Number 18    Authorization - Number of Visits 72    OT Start Time 5809    OT Stop Time 1155    OT Time Calculation (min) 38 min    Activity Tolerance Patient tolerated treatment well    Behavior During Therapy WFL for tasks assessed/performed           Past Medical History:  Diagnosis Date  . Constipation   . GERD (gastroesophageal reflux disease)   . PONV (postoperative nausea and vomiting)   . Ulcer of esophagus     Past Surgical History:  Procedure Laterality Date  . CERVICAL CONIZATION W/BX  1983  . COLONOSCOPY  06/27/2011   Procedure: COLONOSCOPY;  Surgeon: Rogene Houston, MD;  [facility];  Service: Endoscopy;  Laterality: N/A;  12:00  . ESOPHAGOGASTRODUODENOSCOPY N/A 08/10/2015   Procedure: ESOPHAGOGASTRODUODENOSCOPY (EGD);  Surgeon: Rogene Houston, MD;  [facility];  Service: Endoscopy;  Laterality: N/A;  1:00  . FLEXOR TENOTOMY Right 05/09/2020   Procedure: Tenotomy flexor tendons right hand;  Surgeon: Carole Civil, MD;  Location: [facility];  Service: Orthopedics;  Laterality: Right;  . OPEN REDUCTION INTERNAL FIXATION (ORIF) DISTAL RADIAL FRACTURE Right 03/03/2020   Procedure: OPEN REDUCTION INTERNAL FIXATION (ORIF) RIGHT WRIST FRACTURE;  Surgeon: Carole Civil, MD;  Location: [facility];  Service: Orthopedics;  Laterality: Right;    There were no vitals filed for this visit.    Subjective Assessment - 07/26/20 1117    Subjective  S: I  think I'm getting more stretch with my fingertips.    Currently in Pain? No/denies              [facility] OT Assessment - 07/26/20 1117      Assessment   Medical Diagnosis Tenotomy Flexor tendons      Precautions   Precautions Other (comment)    Precaution Comments Splint only at night, A/ROM AA/ROM P/ROM hand and wrist                    OT Treatments/Exercises (OP) - 07/26/20 1119      Exercises   Exercises Wrist;Hand      Additional Wrist Exercises   Sponges 14, 15    Hand Gripper with Large Beads All beads gripper at 7#    Hand Gripper with Medium Beads all beads gripper at 7#      Hand Exercises   Opposition AROM;5 reps   5x5" holds   Other Hand Exercises Pt using red and yellow resistive clothespins to grasp and stack 4 piles of 5 high resistance sponges using 3 point pinch. Pt stacking 2 piles with red and 2 piles with yellow. Pt then replacing all sponges into bucket using red clothespin.      Fine Motor Coordination (Hand/Wrist)   Fine Motor Coordination Tendon glides;Manipulation of small objects;In hand manipuation  training    In Hand Manipulation Training Pt holding 5 shell casings in palm, working on translating to fingertips, flipping to hold by the round end, and placing into container. Increased time for task.     Tendon Glides 10X     Manipulation of small objects Pt holding 4 medium sized beads in her palm and working on palm to fingertip translation to place into bucket. Completed 2x. Pt then transitioning to small beads, holding 5 to translate. Completed 2x. Increased time for task.                     OT Short Term Goals - 07/24/20 1220      OT SHORT TERM GOAL #1   Title Patient will be educated and independent with HEP in order to safely mobilize her right hand in order to decrease risk of contracture and increase functional ROM.    Time 4    Period Weeks    Target Date  06/27/20      OT SHORT TERM GOAL #2   Title Patient will be provided a fabricated right hand dynamic splint in order to provided joint protection during healing phase of procedure while allowing for appropriate ROM.    Time 4    Period Weeks      OT SHORT TERM GOAL #3   Title Patient will improve RUE forearm, wrist, and hand P/ROM to WNL for improved functional use of RUE.    Time 4    Period Weeks    Status On-going      OT SHORT TERM GOAL #4   Title Patient will decrease pain to 3/10 or better in RUE.    Time 4    Period Weeks             OT Long Term Goals - 07/24/20 1221      OT LONG TERM GOAL #1   Title Patient will return to prior level of function using her RUE as dominant with daily tasks.    Time 8    Period Weeks    Status On-going      OT LONG TERM GOAL #2   Title Patient will improve RUE a/rom to [facility] for improved ability to write, drive, and complete desired daily tasks.    Time 8    Period Weeks    Status On-going      OT LONG TERM GOAL #3   Title Patient will improve RUE strength to 4/5 or better for improved ability to complete tasks such as picking up bags of groceries.    Time 8    Period Weeks      OT LONG TERM GOAL #4   Title Patient will improve RUE grip and pinch strength to [facility] in order to open up jars and containers.    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    Baseline 06/21/20 goal revised for a specific time using nine hole peg test    Time 8    Period Weeks                 Plan - 07/26/20 1137    Clinical Impression Statement A: Continued working on digit  flexion and in-hand manipulation. Added hand gripper tasks to target grip strength. Pt requiring intermittent rest breaks during gripper activity. Added in-hand  manipulation tasks with beads and shell casings. Continued with pinch strengthening using red and yellow clothespins. Verbal cuing for form and technique.    Body Structure / Function / Physical Skills ADL;UE functional use;Fascial restriction;Pain;FMC;ROM;GMC;Coordination;IADL;Strength;Edema    Plan P: Try use of paraffin next session. use coban to provided additional passive stretching while using paraffin.    OT Home Exercise Plan eval: passive finger flexion with active extension. Splint education; 10/27: hand/finger A/ROM; 11/10: tendon glides, educated on flexion glove use    Consulted and Agree with Plan of Care Patient           Patient will benefit from skilled therapeutic intervention in order to improve the following deficits and impairments:   Body Structure / Function / Physical Skills: ADL, UE functional use, Fascial restriction, Pain, FMC, ROM, GMC, Coordination, IADL, Strength, Edema       Visit Diagnosis: Other lack of coordination  Stiffness of right hand, not elsewhere classified  Other symptoms and signs involving the musculoskeletal system  Pain in right hand    Problem List Patient Active Problem List   Diagnosis Date Noted  . S/P carpal tunnel release and right hand flexor tenodesis multiple on 05/09/20 05/09/2020  . Contracture of joint of right forearm   . Complex regional pain syndrome i of right lower limb 04/28/2020  . S/P ORIF (open reduction internal fixation) fracture right wrist 03/03/20 03/07/2020  . Closed Barton's fracture of right radius   . Kyphosis of thoracic region 10/03/2017  . Erosive esophagitis 03/08/2013  . Unspecified constipation 03/08/2013   Guadelupe Sabin, OTR/L  [phone] 07/26/2020, 11:55 AM  [place] [address] [place], [place], [address] Phone: [phone]   Fax:  [phone]  Name: Madison Walsh MRN: [id number] Date of Birth: 09-Sep-1961

## 2020-07-31 ENCOUNTER — Ambulatory Visit (HOSPITAL_COMMUNITY): Payer: 59

## 2020-07-31 ENCOUNTER — Encounter (HOSPITAL_COMMUNITY): Payer: Self-pay

## 2020-07-31 ENCOUNTER — Other Ambulatory Visit: Payer: Self-pay

## 2020-07-31 DIAGNOSIS — M79641 Pain in right hand: Secondary | ICD-10-CM

## 2020-07-31 DIAGNOSIS — M25641 Stiffness of right hand, not elsewhere classified: Secondary | ICD-10-CM

## 2020-07-31 DIAGNOSIS — R278 Other lack of coordination: Secondary | ICD-10-CM | POA: Diagnosis not present

## 2020-07-31 DIAGNOSIS — R29898 Other symptoms and signs involving the musculoskeletal system: Secondary | ICD-10-CM

## 2020-07-31 NOTE — Therapy (Signed)
[place], [place], [address] Phone: [phone]   Fax:  [phone]  Occupational Therapy Treatment  Patient Details  Name: Madison Walsh MRN: [id number] Date of Birth: Mar 04, 1961 Referring Provider (OT): Arther Abbott, MD   Encounter Date: 07/31/2020   OT End of Session - 07/31/20 1125    Visit Number 21    Number of Visits 24    Date for OT Re-Evaluation 08/09/20    Authorization Type [employer]    Authorization Time Period 60 visit limit. No copay    Authorization - Visit Number 19    Authorization - Number of Visits 41    OT Start Time 9163    OT Stop Time 1111    OT Time Calculation (min) 38 min    Activity Tolerance Patient tolerated treatment well    Behavior During Therapy WFL for tasks assessed/performed           Past Medical History:  Diagnosis Date  . Constipation   . GERD (gastroesophageal reflux disease)   . PONV (postoperative nausea and vomiting)   . Ulcer of esophagus     Past Surgical History:  Procedure Laterality Date  . CERVICAL CONIZATION W/BX  1983  . COLONOSCOPY  06/27/2011   Procedure: COLONOSCOPY;  Surgeon: Rogene Houston, MD;  [facility];  Service: Endoscopy;  Laterality: N/A;  12:00  . ESOPHAGOGASTRODUODENOSCOPY N/A 08/10/2015   Procedure: ESOPHAGOGASTRODUODENOSCOPY (EGD);  Surgeon: Rogene Houston, MD;  [facility];  Service: Endoscopy;  Laterality: N/A;  1:00  . FLEXOR TENOTOMY Right 05/09/2020   Procedure: Tenotomy flexor tendons right hand;  Surgeon: Carole Civil, MD;  Location: [facility];  Service: Orthopedics;  Laterality: Right;  . OPEN REDUCTION INTERNAL FIXATION (ORIF) DISTAL RADIAL FRACTURE Right 03/03/2020   Procedure: OPEN REDUCTION INTERNAL FIXATION (ORIF) RIGHT WRIST FRACTURE;  Surgeon: Carole Civil, MD;  Location: [facility];  Service: Orthopedics;  Laterality: Right;    There were no vitals filed for this visit.    Subjective Assessment - 07/31/20 1041    Subjective  S: Saturday we went shopping and it was so cold. I couldn't feel my hands.    Currently in Pain? No/denies              [facility] OT Assessment - 07/31/20 1041      Assessment   Medical Diagnosis Tenotomy Flexor tendons      Precautions   Precautions Other (comment)    Precaution Comments Splint only at night, A/ROM AA/ROM P/ROM hand and wrist                    OT Treatments/Exercises (OP) - 07/31/20 1042      Exercises   Exercises Wrist;Hand      Additional Wrist Exercises   Sponges 15,16    Hand Gripper with Large Beads All beads gripper at 7#    Hand Gripper with Medium Beads all beads gripper at 7#      Hand Exercises   Opposition AROM;10 reps   5" hold during MCP flexion and MCP extension also.    Other Hand Exercises Composite digit flexion and extension with 5" hold at end of each position. 10X total      Modalities   Modalities Moist Heat;Paraffin      Moist Heat Therapy   Number Minutes Moist Heat 5 Minutes    Moist Heat Location Hand;Wrist  RUE Paraffin   Number Minutes Paraffin 5 Minutes    RUE Paraffin Location Hand    Comments with coban wrap after paraffin dip to increase flexion of hand.       Fine Motor Coordination (Hand/Wrist)   Manipulation of small objects Pt holding 4 medium sized beads in her palm and working on palm to fingertip translation to place into bucket. Completed 2x. Pt then transitioning to small beads, holding 5 to translate. Completed 2x. Increased time for task.                     OT Short Term Goals - 07/24/20 1220      OT SHORT TERM GOAL #1   Title Patient will be educated and independent with HEP in order to safely mobilize her right hand in order to decrease risk of contracture and increase functional ROM.    Time 4    Period Weeks    Target Date 06/27/20      OT SHORT TERM GOAL #2   Title Patient will be provided a fabricated right hand  dynamic splint in order to provided joint protection during healing phase of procedure while allowing for appropriate ROM.    Time 4    Period Weeks      OT SHORT TERM GOAL #3   Title Patient will improve RUE forearm, wrist, and hand P/ROM to WNL for improved functional use of RUE.    Time 4    Period Weeks    Status On-going      OT SHORT TERM GOAL #4   Title Patient will decrease pain to 3/10 or better in RUE.    Time 4    Period Weeks             OT Long Term Goals - 07/24/20 1221      OT LONG TERM GOAL #1   Title Patient will return to prior level of function using her RUE as dominant with daily tasks.    Time 8    Period Weeks    Status On-going      OT LONG TERM GOAL #2   Title Patient will improve RUE a/rom to [facility] for improved ability to write, drive, and complete desired daily tasks.    Time 8    Period Weeks    Status On-going      OT LONG TERM GOAL #3   Title Patient will improve RUE strength to 4/5 or better for improved ability to complete tasks such as picking up bags of groceries.    Time 8    Period Weeks      OT LONG TERM GOAL #4   Title Patient will improve RUE grip and pinch strength to [facility] in order to open up jars and containers.    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    Baseline 06/21/20 goal revised for a specific time using nine hole peg test    Time 8    Period Weeks                 Plan - 07/31/20 1126    Clinical Impression Statement A: Paraffin wax in combination of coban wrap  and moist heat used at start of session to help with passive ROM of right hand digits. patient focused on functional grip and pinch activities. VC for form and technique were provided. Fatigue noted as session progressed with patient taking rest  breaks as needed.    Body Structure / Function / Physical Skills ADL;UE functional use;Fascial restriction;Pain;FMC;ROM;GMC;Coordination;IADL;Strength;Edema           Patient will benefit from skilled therapeutic intervention in order to improve the following deficits and impairments:   Body Structure / Function / Physical Skills: ADL, UE functional use, Fascial restriction, Pain, FMC, ROM, GMC, Coordination, IADL, Strength, Edema       Visit Diagnosis: Pain in right hand  Other symptoms and signs involving the musculoskeletal system  Stiffness of right hand, not elsewhere classified  Other lack of coordination    Problem List Patient Active Problem List   Diagnosis Date Noted  . S/P carpal tunnel release and right hand flexor tenodesis multiple on 05/09/20 05/09/2020  . Contracture of joint of right forearm   . Complex regional pain syndrome i of right lower limb 04/28/2020  . S/P ORIF (open reduction internal fixation) fracture right wrist 03/03/20 03/07/2020  . Closed Barton's fracture of right radius   . Kyphosis of thoracic region 10/03/2017  . Erosive esophagitis 03/08/2013  . Unspecified constipation 03/08/2013   Ailene Ravel, OTR/L,CBIS  [phone]  07/31/2020, 11:28 AM  [place] [address] [place], [place], [address] Phone: [phone]   Fax:  [phone]  Name: ERINA HAMME MRN: [id number] Date of Birth: [birth year]

## 2020-08-02 ENCOUNTER — Other Ambulatory Visit: Payer: Self-pay

## 2020-08-02 ENCOUNTER — Encounter (HOSPITAL_COMMUNITY): Payer: Self-pay

## 2020-08-02 ENCOUNTER — Ambulatory Visit (HOSPITAL_COMMUNITY): Payer: 59

## 2020-08-02 DIAGNOSIS — R278 Other lack of coordination: Secondary | ICD-10-CM

## 2020-08-02 DIAGNOSIS — M25641 Stiffness of right hand, not elsewhere classified: Secondary | ICD-10-CM

## 2020-08-02 DIAGNOSIS — M79641 Pain in right hand: Secondary | ICD-10-CM

## 2020-08-02 DIAGNOSIS — R29898 Other symptoms and signs involving the musculoskeletal system: Secondary | ICD-10-CM

## 2020-08-02 NOTE — Therapy (Signed)
[place], [place], [address] Phone: [phone]   Fax:  [phone]  Occupational Therapy Treatment  Patient Details  Name: Madison Walsh MRN: [id number] Date of Birth: 06-23-1961 Referring Provider (OT): Arther Abbott, MD   Encounter Date: 08/02/2020   OT End of Session - 08/02/20 1328    Visit Number 22    Number of Visits 24    Date for OT Re-Evaluation 08/09/20    Authorization Type [employer]    Authorization Time Period 60 visit limit. No copay    Authorization - Visit Number 20    Authorization - Number of Visits 69    OT Start Time 1115    OT Stop Time 1154    OT Time Calculation (min) 39 min    Activity Tolerance Patient tolerated treatment well    Behavior During Therapy WFL for tasks assessed/performed           Past Medical History:  Diagnosis Date  . Constipation   . GERD (gastroesophageal reflux disease)   . PONV (postoperative nausea and vomiting)   . Ulcer of esophagus     Past Surgical History:  Procedure Laterality Date  . CERVICAL CONIZATION W/BX  1983  . COLONOSCOPY  06/27/2011   Procedure: COLONOSCOPY;  Surgeon: Rogene Houston, MD;  [facility];  Service: Endoscopy;  Laterality: N/A;  12:00  . ESOPHAGOGASTRODUODENOSCOPY N/A 08/10/2015   Procedure: ESOPHAGOGASTRODUODENOSCOPY (EGD);  Surgeon: Rogene Houston, MD;  [facility];  Service: Endoscopy;  Laterality: N/A;  1:00  . FLEXOR TENOTOMY Right 05/09/2020   Procedure: Tenotomy flexor tendons right hand;  Surgeon: Carole Civil, MD;  Location: [facility];  Service: Orthopedics;  Laterality: Right;  . OPEN REDUCTION INTERNAL FIXATION (ORIF) DISTAL RADIAL FRACTURE Right 03/03/2020   Procedure: OPEN REDUCTION INTERNAL FIXATION (ORIF) RIGHT WRIST FRACTURE;  Surgeon: Carole Civil, MD;  Location: [facility];  Service: Orthopedics;  Laterality: Right;    There were no vitals filed for this visit.    Subjective Assessment - 08/02/20 1121    Subjective  S: I can tell it's getting better. I liked that paraffin wax last time.    Currently in Pain? Yes    Pain Score 3     Pain Location Wrist    Pain Orientation Right    Pain Descriptors / Indicators Sore    Pain Type Acute pain    Pain Radiating Towards N/A    Pain Onset In the past 7 days    Pain Frequency Intermittent    Aggravating Factors  movement and increased use    Pain Relieving Factors pain meds if needed.    Effect of Pain on Daily Activities min effect    Multiple Pain Sites No              [facility] OT Assessment - 08/02/20 1122      Assessment   Medical Diagnosis Tenotomy Flexor tendons      Precautions   Precautions Other (comment)    Precaution Comments Splint only at night, A/ROM AA/ROM P/ROM hand and wrist                    OT Treatments/Exercises (OP) - 08/02/20 1123      Exercises   Exercises Wrist;Hand      Additional Wrist Exercises   Theraputty - Flatten yellow in standing to promote wrist and digit extension    Theraputty -  Pinch Yellow - completed 2 point pinch using thumb and pointer finger.    Theraputty Hand- Locate Pegs yellow - 10 beads located      Hand Exercises   Opposition AROM;10 reps   5" hold for both MCP flexion and MCP extension   Other Hand Exercises Composite digit flexion and extension with 5" hold at end of each position. 10X total      Modalities   Modalities Moist Heat;Paraffin      Moist Heat Therapy   Number Minutes Moist Heat 5 Minutes    Moist Heat Location Hand;Wrist      RUE Paraffin   Number Minutes Paraffin 5 Minutes    RUE Paraffin Location Hand                    OT Short Term Goals - 07/24/20 1220      OT SHORT TERM GOAL #1   Title Patient will be educated and independent with HEP in order to safely mobilize her right hand in order to decrease risk of contracture and increase functional ROM.    Time 4    Period Weeks    Target  Date 06/27/20      OT SHORT TERM GOAL #2   Title Patient will be provided a fabricated right hand dynamic splint in order to provided joint protection during healing phase of procedure while allowing for appropriate ROM.    Time 4    Period Weeks      OT SHORT TERM GOAL #3   Title Patient will improve RUE forearm, wrist, and hand P/ROM to WNL for improved functional use of RUE.    Time 4    Period Weeks    Status On-going      OT SHORT TERM GOAL #4   Title Patient will decrease pain to 3/10 or better in RUE.    Time 4    Period Weeks             OT Long Term Goals - 07/24/20 1221      OT LONG TERM GOAL #1   Title Patient will return to prior level of function using her RUE as dominant with daily tasks.    Time 8    Period Weeks    Status On-going      OT LONG TERM GOAL #2   Title Patient will improve RUE a/rom to [facility] for improved ability to write, drive, and complete desired daily tasks.    Time 8    Period Weeks    Status On-going      OT LONG TERM GOAL #3   Title Patient will improve RUE strength to 4/5 or better for improved ability to complete tasks such as picking up bags of groceries.    Time 8    Period Weeks      OT LONG TERM GOAL #4   Title Patient will improve RUE grip and pinch strength to [facility] in order to open up jars and containers.    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    Baseline 06/21/20 goal revised for a specific time using nine hole peg test    Time 8  Period Weeks                 Plan - 08/02/20 1329    Clinical Impression Statement A: Focused on grip and pinch strengthening and functional use with use of theraputty. Patient was able to complete all activities with increased time. VC for form and technique.    Body  Structure / Function / Physical Skills ADL;UE functional use;Fascial restriction;Pain;FMC;ROM;GMC;Coordination;IADL;Strength;Edema    Plan P: Continue to work on increase hand and digit ROM. Paraffin wax to start session.           Patient will benefit from skilled therapeutic intervention in order to improve the following deficits and impairments:   Body Structure / Function / Physical Skills: ADL, UE functional use, Fascial restriction, Pain, FMC, ROM, GMC, Coordination, IADL, Strength, Edema       Visit Diagnosis: Pain in right hand  Other symptoms and signs involving the musculoskeletal system  Stiffness of right hand, not elsewhere classified  Other lack of coordination    Problem List Patient Active Problem List   Diagnosis Date Noted  . S/P carpal tunnel release and right hand flexor tenodesis multiple on 05/09/20 05/09/2020  . Contracture of joint of right forearm   . Complex regional pain syndrome i of right lower limb 04/28/2020  . S/P ORIF (open reduction internal fixation) fracture right wrist 03/03/20 03/07/2020  . Closed Barton's fracture of right radius   . Kyphosis of thoracic region 10/03/2017  . Erosive esophagitis 03/08/2013  . Unspecified constipation 03/08/2013   Ailene Ravel, OTR/L,CBIS  [phone]  08/02/2020, 1:37 PM  [place] [address] [place], [place], [address] Phone: [phone]   Fax:  [phone]  Name: Madison Walsh MRN: [id number] Date of Birth: 02-Apr-1961

## 2020-08-07 ENCOUNTER — Ambulatory Visit (HOSPITAL_COMMUNITY): Payer: 59

## 2020-08-07 ENCOUNTER — Other Ambulatory Visit: Payer: Self-pay

## 2020-08-07 ENCOUNTER — Encounter (HOSPITAL_COMMUNITY): Payer: Self-pay

## 2020-08-07 DIAGNOSIS — R278 Other lack of coordination: Secondary | ICD-10-CM | POA: Diagnosis not present

## 2020-08-07 DIAGNOSIS — M79641 Pain in right hand: Secondary | ICD-10-CM

## 2020-08-07 DIAGNOSIS — R29898 Other symptoms and signs involving the musculoskeletal system: Secondary | ICD-10-CM

## 2020-08-07 DIAGNOSIS — M25641 Stiffness of right hand, not elsewhere classified: Secondary | ICD-10-CM

## 2020-08-07 NOTE — Therapy (Signed)
[place], [place], [address] Phone: [phone]   Fax:  [phone]  Occupational Therapy Treatment  Patient Details  Name: Madison Walsh MRN: [id number] Date of Birth: May 09, 1961 Referring Provider (OT): Arther Abbott, MD   Encounter Date: 08/07/2020   OT End of Session - 08/07/20 1302    Visit Number 23    Number of Visits 24    Date for OT Re-Evaluation 08/09/20    Authorization Type [employer]    Authorization Time Period 60 visit limit. No copay    Authorization - Visit Number 21    Authorization - Number of Visits 66    OT Start Time 1116    OT Stop Time 1154    OT Time Calculation (min) 38 min    Activity Tolerance Patient tolerated treatment well    Behavior During Therapy WFL for tasks assessed/performed           Past Medical History:  Diagnosis Date  . Constipation   . GERD (gastroesophageal reflux disease)   . PONV (postoperative nausea and vomiting)   . Ulcer of esophagus     Past Surgical History:  Procedure Laterality Date  . CERVICAL CONIZATION W/BX  1983  . COLONOSCOPY  06/27/2011   Procedure: COLONOSCOPY;  Surgeon: Rogene Houston, MD;  [facility];  Service: Endoscopy;  Laterality: N/A;  12:00  . ESOPHAGOGASTRODUODENOSCOPY N/A 08/10/2015   Procedure: ESOPHAGOGASTRODUODENOSCOPY (EGD);  Surgeon: Rogene Houston, MD;  Location: [facility];  Service: Endoscopy;  Laterality: N/A;  1:00  . FLEXOR TENOTOMY Right 05/09/2020   Procedure: Tenotomy flexor tendons right hand;  Surgeon: Carole Civil, MD;  Location: [facility];  Service: Orthopedics;  Laterality: Right;  . OPEN REDUCTION INTERNAL FIXATION (ORIF) DISTAL RADIAL FRACTURE Right 03/03/2020   Procedure: OPEN REDUCTION INTERNAL FIXATION (ORIF) RIGHT WRIST FRACTURE;  Surgeon: Carole Civil, MD;  Location: [facility];  Service: Orthopedics;  Laterality: Right;    There were no vitals filed for this visit.    Subjective Assessment - 08/07/20 1125    Subjective  S: I had trouble opening the green bean can. My husband was sleeping. I was able to get it open it eventually.    Currently in Pain? Yes    Pain Score 3     Pain Location Wrist    Pain Orientation Right    Pain Descriptors / Indicators Sore    Pain Type Acute pain    Pain Radiating Towards N/A    Pain Onset In the past 7 days    Pain Frequency Intermittent   with movement/stretches   Aggravating Factors  movement and using it.    Pain Relieving Factors Nothing needed.    Effect of Pain on Daily Activities no effect    Multiple Pain Sites No              [facility] OT Assessment - 08/07/20 1127      Assessment   Medical Diagnosis Tenotomy Flexor tendons      Precautions   Precautions Other (comment)    Precaution Comments A/ROM, AA/ROM, P/ROM    Other Brace/Splint Pt is wearing the flexion gloves twice a day.                    OT Treatments/Exercises (OP) - 08/07/20 1127      Exercises   Exercises Wrist;Hand      Additional Wrist Exercises  Sponges 16,18    Hand Gripper with Large Beads All beads gripper at 11#   horizontal   Hand Gripper with Medium Beads all beads gripper at 11#   horizontal   Hand Gripper with Small Beads 8 beads with gripper set at 11#   horizontal     Hand Exercises   Opposition AROM;10 reps;Other (comment)   5" hold for MCP extension and MCP flexion   Other Hand Exercises Composite digit flexion and extension with 5" hold at end of each position. 10X total    Other Hand Exercises red resistive clothespin used in right hand with a 3 point pinch to pick up and place 20 sponges on container lid from table.       Modalities   Modalities Moist Heat;Paraffin      Moist Heat Therapy   Number Minutes Moist Heat 5 Minutes    Moist Heat Location Hand;Wrist      RUE Paraffin   Number Minutes Paraffin 5 Minutes    RUE Paraffin Location Hand    Comments With coban wrap to increase stretch.                     OT Short Term Goals - 07/24/20 1220      OT SHORT TERM GOAL #1   Title Patient will be educated and independent with HEP in order to safely mobilize her right hand in order to decrease risk of contracture and increase functional ROM.    Time 4    Period Weeks    Target Date 06/27/20      OT SHORT TERM GOAL #2   Title Patient will be provided a fabricated right hand dynamic splint in order to provided joint protection during healing phase of procedure while allowing for appropriate ROM.    Time 4    Period Weeks      OT SHORT TERM GOAL #3   Title Patient will improve RUE forearm, wrist, and hand P/ROM to WNL for improved functional use of RUE.    Time 4    Period Weeks    Status On-going      OT SHORT TERM GOAL #4   Title Patient will decrease pain to 3/10 or better in RUE.    Time 4    Period Weeks             OT Long Term Goals - 07/24/20 1221      OT LONG TERM GOAL #1   Title Patient will return to prior level of function using her RUE as dominant with daily tasks.    Time 8    Period Weeks    Status On-going      OT LONG TERM GOAL #2   Title Patient will improve RUE a/rom to [facility] for improved ability to write, drive, and complete desired daily tasks.    Time 8    Period Weeks    Status On-going      OT LONG TERM GOAL #3   Title Patient will improve RUE strength to 4/5 or better for improved ability to complete tasks such as picking up bags of groceries.    Time 8    Period Weeks      OT LONG TERM GOAL #4   Title Patient will improve RUE grip and pinch strength to [facility] in order to open up jars and containers.    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    Baseline 06/21/20 goal revised for a specific time using nine hole peg  test    Time 8    Period Weeks                 Plan - 08/07/20 1303    Clinical Impression Statement A: Continued to focus on grip and pinch strengthening. patient was able to progress resistance to 11# with handgripper for large and mediumd beads. Started working with small beads as well using same resistance to pick up 8 with increased time and rest breaks as needed. VC for form and technique were provided.    Body Structure / Function / Physical Skills ADL;UE functional use;Fascial restriction;Pain;FMC;ROM;GMC;Coordination;IADL;Strength;Edema    Plan P: Reassessment. Continue OT if patient is interested to focus on remaining deficits.    Consulted and Agree with Plan of Care Patient           Patient will benefit from skilled therapeutic intervention in order to improve the following deficits and impairments:   Body Structure / Function / Physical Skills: ADL, UE functional use, Fascial restriction, Pain, FMC, ROM, GMC, Coordination, IADL, Strength, Edema       Visit Diagnosis: Pain in right hand  Other symptoms and signs involving the musculoskeletal system  Stiffness of right hand, not elsewhere classified  Other lack of coordination    Problem List Patient Active Problem List   Diagnosis Date Noted  . S/P carpal tunnel release and right hand flexor tenodesis multiple on 05/09/20 05/09/2020  . Contracture of joint of right forearm   . Complex regional pain syndrome i of right lower limb 04/28/2020  . S/P ORIF (open reduction internal fixation) fracture right wrist 03/03/20 03/07/2020  . Closed Barton's fracture of right radius   . Kyphosis of thoracic region 10/03/2017  . Erosive esophagitis 03/08/2013  . Unspecified constipation 03/08/2013   Ailene Ravel, OTR/L,CBIS  [phone]  08/07/2020, 1:05 PM  [place] [address] [place], [place], [address] Phone: [phone]   Fax:  [phone]  Name:  Madison Walsh MRN: [id number] Date of Birth: 08-12-1961

## 2020-08-09 ENCOUNTER — Encounter (HOSPITAL_COMMUNITY): Payer: Self-pay

## 2020-08-09 ENCOUNTER — Other Ambulatory Visit: Payer: Self-pay

## 2020-08-09 ENCOUNTER — Ambulatory Visit (HOSPITAL_COMMUNITY): Payer: 59 | Attending: Orthopedic Surgery

## 2020-08-09 DIAGNOSIS — R29898 Other symptoms and signs involving the musculoskeletal system: Secondary | ICD-10-CM | POA: Diagnosis present

## 2020-08-09 DIAGNOSIS — R278 Other lack of coordination: Secondary | ICD-10-CM | POA: Diagnosis present

## 2020-08-09 DIAGNOSIS — M79641 Pain in right hand: Secondary | ICD-10-CM | POA: Diagnosis present

## 2020-08-09 DIAGNOSIS — M25641 Stiffness of right hand, not elsewhere classified: Secondary | ICD-10-CM | POA: Diagnosis present

## 2020-08-09 NOTE — Therapy (Signed)
[place], [place], [address] Phone: [phone]   Fax:  [phone]  Occupational Therapy Treatment  Patient Details  Name: Madison Walsh MRN: [id number] Date of Birth: [birth year] Referring Provider (OT): Arther Abbott, MD   Encounter Date: 08/09/2020   OT End of Session - 08/09/20 7001    Visit Number 24    Number of Visits 32    Date for OT Re-Evaluation 09/06/20    Authorization Type [employer]    Authorization Time Period 60 visit limit. No copay    Authorization - Visit Number 22    Authorization - Number of Visits 60    OT Start Time 1119   reassessment   OT Stop Time 1200    OT Time Calculation (min) 41 min    Activity Tolerance Patient tolerated treatment well    Behavior During Therapy WFL for tasks assessed/performed           Past Medical History:  Diagnosis Date  . Constipation   . GERD (gastroesophageal reflux disease)   . PONV (postoperative nausea and vomiting)   . Ulcer of esophagus     Past Surgical History:  Procedure Laterality Date  . CERVICAL CONIZATION W/BX  1983  . COLONOSCOPY  06/27/2011   Procedure: COLONOSCOPY;  Surgeon: Rogene Houston, MD;  [facility];  Service: Endoscopy;  Laterality: N/A;  12:00  . ESOPHAGOGASTRODUODENOSCOPY N/A 08/10/2015   Procedure: ESOPHAGOGASTRODUODENOSCOPY (EGD);  Surgeon: Rogene Houston, MD;  [facility];  Service: Endoscopy;  Laterality: N/A;  1:00  . FLEXOR TENOTOMY Right 05/09/2020   Procedure: Tenotomy flexor tendons right hand;  Surgeon: Carole Civil, MD;  Location: [facility];  Service: Orthopedics;  Laterality: Right;  . OPEN REDUCTION INTERNAL FIXATION (ORIF) DISTAL RADIAL FRACTURE Right 03/03/2020   Procedure: OPEN REDUCTION INTERNAL FIXATION (ORIF) RIGHT WRIST FRACTURE;  Surgeon: Carole Civil, MD;  Location: [facility];  Service: Orthopedics;  Laterality: Right;    There were no vitals filed for  this visit.   Subjective Assessment - 08/09/20 1234    Subjective  S: I feel i'm made improvements with everything but I still need to work on ROM and strength.    Currently in Pain? No/denies              [facility] OT Assessment - 08/09/20 1125      Assessment   Medical Diagnosis Tenotomy Flexor tendons    Referring Provider (OT) Arther Abbott, MD    Hand Dominance Right      Precautions   Precautions Other (comment)    Precaution Comments A/ROM, AA/ROM, P/ROM    Other Brace/Splint Pt is wearing the flexion gloves twice a day.      Prior Function   Level of Independence Independent      Coordination   Right 9 Hole Peg Test 24.2"   previous: 38.68"     ROM / Strength   AROM / PROM / Strength AROM;Strength;PROM      AROM   AROM Assessment Site Wrist    Right/Left Wrist Right    Right Wrist Extension 52 Degrees   previous: 36   Right Wrist Flexion 46 Degrees   previous: 40   Right Wrist Ulnar Deviation 18 Degrees   previous; 15   Right Thumb Opposition Digit 2;Digit 3;Digit 5;Digit 4      PROM   Right Wrist Extension 68 Degrees   previous: 42  Right Wrist Flexion 60 Degrees   previous: 50   Right Wrist Ulnar Deviation 20 Degrees   previous: 15     Strength   Right Forearm Pronation 4+/5   previous: same   Right Forearm Supination 4+/5   previous: same   Right Wrist Flexion 4/5   previous : same   Right Wrist Extension 5/5   previous: 4/5   Right Wrist Radial Deviation 5/5   previous: same   Right Wrist Ulnar Deviation 5/5   previous: 4/5   Right Hand Grip (lbs) 15   previous: 12   Right Hand Lateral Pinch 7 lbs   previous: 6   Right Hand 3 Point Pinch 4 lbs   previous: same     Right Hand AROM   R Thumb MCP 0-60 62 Degrees   previous: 50   R Thumb IP 0-80 34 Degrees   previous: 35   R Index  MCP 0-90 68 Degrees   previous: 62   R Index PIP 0-100 82 Degrees   previous: 82   R Index DIP 0-70 42 Degrees   previous: 18   R Long  MCP 0-90 78 Degrees   previous:  74   R Long PIP 0-100 80 Degrees   previous:  same   R Long DIP 0-70 48 Degrees   previous: 10   R Ring  MCP 0-90 72 Degrees   previous: 62   R Ring PIP 0-100 92 Degrees   previous: 80   R Ring DIP 0-70 28 Degrees   previous: 16   R Little  MCP 0-90 80 Degrees   previous: 48   R Little PIP 0-100 90 Degrees   previous: 74   R Little DIP 0-70 36 Degrees   previous: 16     Right Hand PROM   R Thumb MCP 0-60 64 Degrees   previous: 70   R Thumb IP 0-80 54 Degrees   previous: 55   R Index  MCP 0-90 88 Degrees   previous: 72   R Index PIP 0-100 100 Degrees   previous: 62   R Index DIP 0-70 62 Degrees   previous: 48   R Long  MCP 0-90 98 Degrees   previous: 58   R Long PIP 0-100 100 Degrees   previous: 78   R Long DIP 0-70 64 Degrees   prevoius: 55   R Ring  MCP 0-90 98 Degrees   previous: 72   R Ring PIP 0-100 100 Degrees   previous: 80   R Ring DIP 0-70 56 Degrees   previous: 50   R Little  MCP 0-90 94 Degrees   previous: 68   R Little PIP 0-100 100 Degrees   previous: 80   R Little DIP 0-70 50 Degrees              Quick Dash - 08/09/20 1126    Open a tight or new jar Unable    Do heavy household chores (wash walls, wash floors) Mild difficulty    Carry a shopping bag or briefcase Moderate difficulty    Wash your back Unable    Use a knife to cut food Moderate difficulty    Recreational activities in which you take some force or impact through your arm, shoulder, or hand (golf, hammering, tennis) Severe difficulty    During the past week, to what extent has your arm, shoulder or hand problem interfered with your normal social activities with family,  friends, neighbors, or groups? Not at all    During the past week, to what extent has your arm, shoulder or hand problem limited your work or other regular daily activities Modererately    Arm, shoulder, or hand pain. Moderate    Tingling (pins and needles) in your arm, shoulder, or hand Mild    Difficulty Sleeping No difficulty     DASH Score 47.73 %                OT Treatments/Exercises (OP) - 08/09/20 1235      Modalities   Modalities Moist Heat;Paraffin      Moist Heat Therapy   Number Minutes Moist Heat 5 Minutes    Moist Heat Location Hand;Wrist      RUE Paraffin   Number Minutes Paraffin 5 Minutes    RUE Paraffin Location Hand    Comments with coban wrap to increase passive stretch.                  OT Education - 08/09/20 1236    Education Details discussed progress in therapy and therapy goals.    Person(s) Educated Patient    Methods Explanation    Comprehension Verbalized understanding            OT Short Term Goals - 08/09/20 1155      OT SHORT TERM GOAL #1   Title Patient will be educated and independent with HEP in order to safely mobilize her right hand in order to decrease risk of contracture and increase functional ROM.    Time 4    Period Weeks    Target Date 06/27/20      OT SHORT TERM GOAL #2   Title Patient will be provided a fabricated right hand dynamic splint in order to provided joint protection during healing phase of procedure while allowing for appropriate ROM.    Time 4    Period Weeks      OT SHORT TERM GOAL #3   Title Patient will improve RUE forearm, wrist, and hand P/ROM to WNL for improved functional use of RUE.    Time 4    Period Weeks    Status Achieved      OT SHORT TERM GOAL #4   Title Patient will decrease pain to 3/10 or better in RUE.    Time 4    Period Weeks             OT Long Term Goals - 08/09/20 1155      OT LONG TERM GOAL #1   Title Patient will return to prior level of function using her RUE as dominant with daily tasks.    Baseline 12/1: pt reports she is 50% of functional use with RUE during daily tasks.    Time 8    Period Weeks    Status On-going      OT LONG TERM GOAL #2   Title Patient will improve RUE a/rom to [facility] for improved ability to write, drive, and complete desired daily tasks.    Time 8    Period  Weeks    Status On-going      OT LONG TERM GOAL #3   Title Patient will improve RUE strength to 4/5 or better for improved ability to complete tasks such as picking up bags of groceries.    Time 8    Period Weeks      OT LONG TERM GOAL #4   Title Patient will improve RUE  grip and pinch strength to [facility] in order to open up jars and containers.    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    Baseline 06/21/20 goal revised for a specific time using nine hole peg test    Time 8    Period Weeks                 Plan - 08/09/20 1238    Clinical Impression Statement A: Reassessment completed this date. Patient has made great progress with passive and active ROM measurements since last progress note. Passively she is able to achieve full/WNL. Hand strength continues to be limited although improvement is apparent. Pt reports that she is able to see progress although she is not using her right hand as her dominant for all daily tasks; reports 50% use. Pt will benefitt from continuing OT services to focus on strength and ROM.    Body Structure / Function / Physical Skills ADL;UE functional use;Fascial restriction;Pain;FMC;ROM;GMC;Coordination;IADL;Strength;Edema    OT Frequency 2x / week    OT Duration 4 weeks    Plan P: Continue skilled OT services 2x a week for 4 weeks focusing on mentioned deficits. Follow up on MD appointment. complete sustained grip strength activity.Review HEP update/remove any exercises as needed.    Consulted and Agree with Plan of Care Patient           Patient will benefit from skilled therapeutic intervention in order to improve the following deficits and impairments:   Body Structure / Function / Physical Skills: ADL, UE functional use, Fascial restriction,  Pain, FMC, ROM, GMC, Coordination, IADL, Strength, Edema       Visit Diagnosis: Pain in right hand - Plan: Ot plan of care cert/re-cert  Other symptoms and signs involving the musculoskeletal system - Plan: Ot plan of care cert/re-cert  Stiffness of right hand, not elsewhere classified - Plan: Ot plan of care cert/re-cert  Other lack of coordination - Plan: Ot plan of care cert/re-cert    Problem List Patient Active Problem List   Diagnosis Date Noted  . S/P carpal tunnel release and right hand flexor tenodesis multiple on 05/09/20 05/09/2020  . Contracture of joint of right forearm   . Complex regional pain syndrome i of right lower limb 04/28/2020  . S/P ORIF (open reduction internal fixation) fracture right wrist 03/03/20 03/07/2020  . Closed Barton's fracture of right radius   . Kyphosis of thoracic region 10/03/2017  . Erosive esophagitis 03/08/2013  . Unspecified constipation 03/08/2013    Ailene Ravel, OTR/L,CBIS  [phone]  08/09/2020, 12:48 PM  [place] [address] [place], [place], [address] Phone: [phone]   Fax:  [phone]  Name: Madison Walsh MRN: [id number] Date of Birth: 1961-08-18

## 2020-08-15 ENCOUNTER — Other Ambulatory Visit: Payer: Self-pay

## 2020-08-15 ENCOUNTER — Encounter (HOSPITAL_COMMUNITY): Payer: Self-pay

## 2020-08-15 ENCOUNTER — Ambulatory Visit (HOSPITAL_COMMUNITY): Payer: 59

## 2020-08-15 DIAGNOSIS — M79641 Pain in right hand: Secondary | ICD-10-CM | POA: Diagnosis not present

## 2020-08-15 DIAGNOSIS — R278 Other lack of coordination: Secondary | ICD-10-CM

## 2020-08-15 DIAGNOSIS — R29898 Other symptoms and signs involving the musculoskeletal system: Secondary | ICD-10-CM

## 2020-08-15 DIAGNOSIS — M25641 Stiffness of right hand, not elsewhere classified: Secondary | ICD-10-CM

## 2020-08-15 NOTE — Patient Instructions (Signed)
Tendon Gliding Exercises: Complete 10X, hold for 3-5 seconds, 1-2x per day  1) Straight: begin with wrist in extended position and fingers straight      2) Hook: Bend your fingers making them look like a hook while keeping your thumb straight.      3) Fist: Make your hand into a fist.      4) Table Top: Straighten your fingers straight out making them look like a table top.      5) Straight Fist: Bend your fingers straight down into a straight fist.    2) Digit composite flexion/adduction (make a fist) Hold your hand up as shown. Open and close your hand into a fist and repeat. If you cannot make a full fist, then make a partial fist. Complete 10-15X Periodically throughout the day.     Pinch middle knuckle of __each___ finger individually of right hand to prevent bending. Bend end knuckle until stretch is felt. Hold __2__ seconds. Relax. Straighten finger as far as possible. Repeat _10___ times per set. Do ____ sets per session. Do __1-2__ sessions per day.  Copyright  [employer]. All rights reserved.    AROM: PIP Flexion / Extension   Pinch bottom knuckle of ____each___ finger individually of right hand to prevent bending. Actively bend middle knuckle until stretch is felt. Hold _2___ seconds. Relax. Straighten finger as far as possible. Repeat ____ times per set. Do ____ sets per session. Do _1-2___ sessions per day.  Copyright  [employer]. All rights reserved.   Posterior Capsule Stretch   Stand or sit, one arm across body so hand rests over opposite shoulder. Gently push on crossed elbow with other hand until stretch is felt in shoulder of crossed arm. Hold _20-30__ seconds.  Repeat _2__ times per session. Do _1__ sessions per day.  Wall Flexion  Slide your arm up the wall or door frame until a stretch is felt in your shoulder . Hold for 20-30 seconds. Complete 2 times     Shoulder Abduction Stretch  Stand side ways by a wall with affected up  on wall. Gently step in toward wall to feel stretch. Hold for 20-30 seconds. Complete 2 times.

## 2020-08-15 NOTE — Therapy (Signed)
[place], [place], [address] Phone: [phone]   Fax:  [phone]  Occupational Therapy Treatment  Patient Details  Name: Madison Walsh MRN: [id number] Date of Birth: 1961/01/18 Referring Provider (OT): Arther Abbott, MD   Encounter Date: 08/15/2020   OT End of Session - 08/15/20 1315    Visit Number 25    Number of Visits 32    Date for OT Re-Evaluation 09/06/20    Authorization Type [employer]    Authorization Time Period 60 visit limit. No copay    Authorization - Visit Number 23    Authorization - Number of Visits 31    OT Start Time 0945    OT Stop Time 1027    OT Time Calculation (min) 42 min    Activity Tolerance Patient tolerated treatment well    Behavior During Therapy WFL for tasks assessed/performed           Past Medical History:  Diagnosis Date  . Constipation   . GERD (gastroesophageal reflux disease)   . PONV (postoperative nausea and vomiting)   . Ulcer of esophagus     Past Surgical History:  Procedure Laterality Date  . CERVICAL CONIZATION W/BX  1983  . COLONOSCOPY  06/27/2011   Procedure: COLONOSCOPY;  Surgeon: Rogene Houston, MD;  [facility];  Service: Endoscopy;  Laterality: N/A;  12:00  . ESOPHAGOGASTRODUODENOSCOPY N/A 08/10/2015   Procedure: ESOPHAGOGASTRODUODENOSCOPY (EGD);  Surgeon: Rogene Houston, MD;  [facility];  Service: Endoscopy;  Laterality: N/A;  1:00  . FLEXOR TENOTOMY Right 05/09/2020   Procedure: Tenotomy flexor tendons right hand;  Surgeon: Carole Civil, MD;  Location: [facility];  Service: Orthopedics;  Laterality: Right;  . OPEN REDUCTION INTERNAL FIXATION (ORIF) DISTAL RADIAL FRACTURE Right 03/03/2020   Procedure: OPEN REDUCTION INTERNAL FIXATION (ORIF) RIGHT WRIST FRACTURE;  Surgeon: Carole Civil, MD;  Location: [facility];  Service: Orthopedics;  Laterality: Right;    There were no vitals filed for this visit.    Subjective Assessment - 08/15/20 1009    Subjective  S: I drove for 5 hours to [place] over the weekend so I didn't get to do my exercises.    Currently in Pain? No/denies              [facility] OT Assessment - 08/15/20 1010      Assessment   Medical Diagnosis Tenotomy Flexor tendons      Precautions   Precautions Other (comment)    Precaution Comments A/ROM, AA/ROM, P/ROM    Other Brace/Splint Pt is wearing the flexion gloves twice a day.                    OT Treatments/Exercises (OP) - 08/15/20 1010      Exercises   Exercises Wrist;Hand;Theraputty      Hand Exercises   Other Hand Exercises Utilized PVC pipe to press circles into flattened putty focusing on wrist flexion/extension and grip strength.      Theraputty   Theraputty - Flatten yellow in standing to promote wrist and digit extension    Theraputty - Roll yellow     Theraputty - Grip pronated grip     Theraputty - Pinch Yellow - completed 2 point pinch using thumb and pointer finger.      Modalities   Modalities Moist Heat;Paraffin      Moist Heat Therapy   Number Minutes Moist Heat 5 Minutes  Moist Heat Location Hand;Wrist      RUE Paraffin   Number Minutes Paraffin 5 Minutes    RUE Paraffin Location Hand    Comments with coban wrap to increase passive stretch.                  OT Education - 08/15/20 1008    Education Details Reviewed HEP and re-printed exercises and deleted the ones that she may stop.    Person(s) Educated Patient    Methods Explanation;Handout    Comprehension Verbalized understanding            OT Short Term Goals - 08/15/20 1318      OT SHORT TERM GOAL #1   Title Patient will be educated and independent with HEP in order to safely mobilize her right hand in order to decrease risk of contracture and increase functional ROM.    Time 4    Period Weeks    Target Date 06/27/20      OT SHORT TERM GOAL #2   Title Patient will be provided a fabricated right  hand dynamic splint in order to provided joint protection during healing phase of procedure while allowing for appropriate ROM.    Time 4    Period Weeks      OT SHORT TERM GOAL #3   Title Patient will improve RUE forearm, wrist, and hand P/ROM to WNL for improved functional use of RUE.    Time 4    Period Weeks      OT SHORT TERM GOAL #4   Title Patient will decrease pain to 3/10 or better in RUE.    Time 4    Period Weeks             OT Long Term Goals - 08/15/20 1318      OT LONG TERM GOAL #1   Title Patient will return to prior level of function using her RUE as dominant with daily tasks.    Baseline 12/1: pt reports she is 50% of functional use with RUE during daily tasks.    Time 8    Period Weeks    Status On-going      OT LONG TERM GOAL #2   Title Patient will improve RUE a/rom to [facility] for improved ability to write, drive, and complete desired daily tasks.    Time 8    Period Weeks    Status On-going      OT LONG TERM GOAL #3   Title Patient will improve RUE strength to 4/5 or better for improved ability to complete tasks such as picking up bags of groceries.    Time 8    Period Weeks      OT LONG TERM GOAL #4   Title Patient will improve RUE grip and pinch strength to [facility] in order to open up jars and containers.    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    Baseline 06/21/20 goal revised for a specific time using nine hole peg test    Time 8    Period Weeks                 Plan - 08/15/20 1316  Clinical Impression Statement A: Reviewed HEP to update and remove any exercises that no longer are required. Focused on hand strengthening with use of yellow theraputty. patient completed all activities with rest breaks if needed. VC for form and  technique.    Body Structure / Function / Physical Skills ADL;UE functional use;Fascial restriction;Pain;FMC;ROM;GMC;Coordination;IADL;Strength;Edema    Plan P: Follow up on MD appointment. Complete sustained grip strength activity.    OT Home Exercise Plan eval: passive finger flexion with active extension. Splint education; 10/27: hand/finger A/ROM; 11/10: tendon glides, educated on flexion glove use 12/7: HEP updated this date.    Consulted and Agree with Plan of Care Patient           Patient will benefit from skilled therapeutic intervention in order to improve the following deficits and impairments:   Body Structure / Function / Physical Skills: ADL, UE functional use, Fascial restriction, Pain, FMC, ROM, GMC, Coordination, IADL, Strength, Edema       Visit Diagnosis: Pain in right hand  Other symptoms and signs involving the musculoskeletal system  Stiffness of right hand, not elsewhere classified  Other lack of coordination    Problem List Patient Active Problem List   Diagnosis Date Noted  . S/P carpal tunnel release and right hand flexor tenodesis multiple on 05/09/20 05/09/2020  . Contracture of joint of right forearm   . Complex regional pain syndrome i of right lower limb 04/28/2020  . S/P ORIF (open reduction internal fixation) fracture right wrist 03/03/20 03/07/2020  . Closed Barton's fracture of right radius   . Kyphosis of thoracic region 10/03/2017  . Erosive esophagitis 03/08/2013  . Unspecified constipation 03/08/2013   Ailene Ravel, OTR/L,CBIS  [phone]  08/15/2020, 1:20 PM  [facility] [address] [place], [place], [address] Phone: [phone]   Fax:  [phone]  Name: ADALEEN HULGAN MRN: [id number] Date of Birth: 1961/03/18

## 2020-08-16 ENCOUNTER — Encounter: Payer: Self-pay | Admitting: Orthopedic Surgery

## 2020-08-16 ENCOUNTER — Ambulatory Visit (INDEPENDENT_AMBULATORY_CARE_PROVIDER_SITE_OTHER): Payer: 59 | Admitting: Orthopedic Surgery

## 2020-08-16 VITALS — BP 132/76 | HR 78 | Ht 62.0 in | Wt 129.0 lb

## 2020-08-16 DIAGNOSIS — Z9889 Other specified postprocedural states: Secondary | ICD-10-CM

## 2020-08-16 DIAGNOSIS — Z8781 Personal history of (healed) traumatic fracture: Secondary | ICD-10-CM | POA: Diagnosis not present

## 2020-08-16 NOTE — Patient Instructions (Signed)
Continue clonidine  Continue therapy

## 2020-08-16 NOTE — Progress Notes (Signed)
Chief Complaint  Patient presents with  . Post-op Follow-up    05/09/20 right hand flexor tendon releases improving , orif wrist 03/03/20   59 year old female right-handed types at work status post ORIF right wrist for Barton's fracture dislocation developed flexor tendon contractures which were released on August 31 index surgery on June 25  She is making progress still having a lot of trouble but with her pronation and supination with some ulnar-sided wrist pain her RSD symptoms have improved significantly where she only has a little bit of tingling at times and occasionally temperature changes in the hands she is on clonidine no complications from that she is not requiring any other medications at this time  Recommend she continue with functional activity training she does have a ergonomic keyboard which should help  I will see her beginning of the year

## 2020-08-17 ENCOUNTER — Other Ambulatory Visit: Payer: Self-pay

## 2020-08-17 ENCOUNTER — Ambulatory Visit (HOSPITAL_COMMUNITY): Payer: 59

## 2020-08-17 DIAGNOSIS — M25641 Stiffness of right hand, not elsewhere classified: Secondary | ICD-10-CM

## 2020-08-17 DIAGNOSIS — M79641 Pain in right hand: Secondary | ICD-10-CM

## 2020-08-17 DIAGNOSIS — R29898 Other symptoms and signs involving the musculoskeletal system: Secondary | ICD-10-CM

## 2020-08-17 DIAGNOSIS — R278 Other lack of coordination: Secondary | ICD-10-CM

## 2020-08-17 NOTE — Therapy (Signed)
[place], [place], [address] Phone: [phone]   Fax:  [phone]  Occupational Therapy Treatment  Patient Details  Name: Madison Walsh MRN: [id number] Date of Birth: April 12, 1961 Referring Provider (OT): Arther Abbott, MD   Encounter Date: 08/17/2020   OT End of Session - 08/17/20 1218    Visit Number 26    Number of Visits 32    Date for OT Re-Evaluation 09/06/20    Authorization Type [employer]    Authorization Time Period 60 visit limit. No copay    Authorization - Visit Number 24    Authorization - Number of Visits 66    OT Start Time 8889    OT Stop Time 1153    OT Time Calculation (min) 38 min    Activity Tolerance Patient tolerated treatment well    Behavior During Therapy WFL for tasks assessed/performed           Past Medical History:  Diagnosis Date  . Constipation   . GERD (gastroesophageal reflux disease)   . PONV (postoperative nausea and vomiting)   . Ulcer of esophagus     Past Surgical History:  Procedure Laterality Date  . CERVICAL CONIZATION W/BX  1983  . COLONOSCOPY  06/27/2011   Procedure: COLONOSCOPY;  Surgeon: Rogene Houston, MD;  [facility];  Service: Endoscopy;  Laterality: N/A;  12:00  . ESOPHAGOGASTRODUODENOSCOPY N/A 08/10/2015   Procedure: ESOPHAGOGASTRODUODENOSCOPY (EGD);  Surgeon: Rogene Houston, MD;  [facility];  Service: Endoscopy;  Laterality: N/A;  1:00  . FLEXOR TENOTOMY Right 05/09/2020   Procedure: Tenotomy flexor tendons right hand;  Surgeon: Carole Civil, MD;  Location: [facility];  Service: Orthopedics;  Laterality: Right;  . OPEN REDUCTION INTERNAL FIXATION (ORIF) DISTAL RADIAL FRACTURE Right 03/03/2020   Procedure: OPEN REDUCTION INTERNAL FIXATION (ORIF) RIGHT WRIST FRACTURE;  Surgeon: Carole Civil, MD;  Location: [facility];  Service: Orthopedics;  Laterality: Right;    There were no vitals filed for this visit.    Subjective Assessment - 08/17/20 1121    Subjective  S: Dr. Aline Brochure wants to see me in january again.    Currently in Pain? Yes    Pain Score 3     Pain Location Other (Comment)   forearm   Pain Orientation Right    Pain Descriptors / Indicators Sore    Pain Type Acute pain    Pain Radiating Towards N/A    Pain Onset In the past 7 days    Pain Frequency Intermittent    Aggravating Factors  wrist flexion and extension stretches and exercises    Pain Relieving Factors not doing anything    Effect of Pain on Daily Activities no effect    Multiple Pain Sites No              [facility] OT Assessment - 08/17/20 1123      Assessment   Medical Diagnosis Tenotomy Flexor tendons      Precautions   Precautions Other (comment)    Precaution Comments Progress as tolerated    Other Brace/Splint Pt is wearing the flexion gloves twice a day.                    OT Treatments/Exercises (OP) - 08/17/20 1124      Exercises   Exercises Wrist;Hand;Theraputty      Additional Wrist Exercises   Hand Gripper with Large Beads All beads gripper  at 11#   vertical. Completed as a sustained grip task   Hand Gripper with Medium Beads all beads except 4 with gripper at 11#. picked up remaining 4 beads with gripper set at 7#   Completed as sustained grip task   Hand Gripper with Small Beads all beads with gripper at 7#   completed as sustained grip task     Hand Exercises   Other Hand Exercises Composite finger flexion completed passively 5X with prolonged hold. P/ROM supination with prolonged stretch hold completed 5X      Modalities   Modalities Moist Heat;Paraffin      Moist Heat Therapy   Number Minutes Moist Heat 5 Minutes    Moist Heat Location Hand;Wrist      RUE Paraffin   Number Minutes Paraffin 5 Minutes    RUE Paraffin Location Hand    Comments with coban wrap to increase stretch                    OT Short Term Goals - 08/15/20 1318      OT SHORT TERM GOAL #1    Title Patient will be educated and independent with HEP in order to safely mobilize her right hand in order to decrease risk of contracture and increase functional ROM.    Time 4    Period Weeks    Target Date 06/27/20      OT SHORT TERM GOAL #2   Title Patient will be provided a fabricated right hand dynamic splint in order to provided joint protection during healing phase of procedure while allowing for appropriate ROM.    Time 4    Period Weeks      OT SHORT TERM GOAL #3   Title Patient will improve RUE forearm, wrist, and hand P/ROM to WNL for improved functional use of RUE.    Time 4    Period Weeks      OT SHORT TERM GOAL #4   Title Patient will decrease pain to 3/10 or better in RUE.    Time 4    Period Weeks             OT Long Term Goals - 08/15/20 1318      OT LONG TERM GOAL #1   Title Patient will return to prior level of function using her RUE as dominant with daily tasks.    Baseline 12/1: pt reports she is 50% of functional use with RUE during daily tasks.    Time 8    Period Weeks    Status On-going      OT LONG TERM GOAL #2   Title Patient will improve RUE a/rom to [facility] for improved ability to write, drive, and complete desired daily tasks.    Time 8    Period Weeks    Status On-going      OT LONG TERM GOAL #3   Title Patient will improve RUE strength to 4/5 or better for improved ability to complete tasks such as picking up bags of groceries.    Time 8    Period Weeks      OT LONG TERM GOAL #4   Title Patient will improve RUE grip and pinch strength to [facility] in order to open up jars and containers.    Time 8    Period Weeks    Status On-going      OT LONG TERM GOAL #5   Title Patient will decrease her nine hole peg test  time by at least 20" while using her right hand to promote ability to fasten jewelery and write with her dominant right hand.    Baseline 06/21/20 goal revised for a specific time using nine hole peg test    Time 8    Period  Weeks                 Plan - 08/17/20 1222    Clinical Impression Statement A: After use of paraffin and heat at start of session, passive stretching was completed for composite flexion with patient able to touch finger tips to palm. Discussed using heat at home prior to supination stretch and completing manual massage to forarm afterwards to help with soreness.Focused on sustained grip activity with handgripper. Started with resistance of 11# then transitioned down to 7# due to muscle/hand fatigue. VC for form and technique were provided.    Body Structure / Function / Physical Skills ADL;UE functional use;Fascial restriction;Pain;FMC;ROM;GMC;Coordination;IADL;Strength;Edema    Plan P: Sustained pinch task using resisticve clothespins.    Consulted and Agree with Plan of Care Patient           Patient will benefit from skilled therapeutic intervention in order to improve the following deficits and impairments:   Body Structure / Function / Physical Skills: ADL,UE functional use,Fascial restriction,Pain,FMC,ROM,GMC,Coordination,IADL,Strength,Edema       Visit Diagnosis: Other symptoms and signs involving the musculoskeletal system  Stiffness of right hand, not elsewhere classified  Other lack of coordination  Pain in right hand    Problem List Patient Active Problem List   Diagnosis Date Noted  . S/P carpal tunnel release and right hand flexor tenodesis multiple on 05/09/20 05/09/2020  . Contracture of joint of right forearm   . Complex regional pain syndrome i of right lower limb 04/28/2020  . S/P ORIF (open reduction internal fixation) fracture right wrist 03/03/20 03/07/2020  . Closed Barton's fracture of right radius   . Kyphosis of thoracic region 10/03/2017  . Erosive esophagitis 03/08/2013  . Unspecified constipation 03/08/2013   Ailene Ravel, OTR/L,CBIS  [phone]  08/17/2020, 12:25 PM  [facility] [address] [place], [place], [address] Phone: [phone]   Fax:  [phone]  Name: Madison Walsh MRN: [id number] Date of Birth: November 14, 1960

## 2020-08-22 ENCOUNTER — Other Ambulatory Visit: Payer: Self-pay

## 2020-08-22 ENCOUNTER — Encounter (HOSPITAL_COMMUNITY): Payer: Self-pay

## 2020-08-22 ENCOUNTER — Ambulatory Visit (HOSPITAL_COMMUNITY): Payer: 59

## 2020-08-22 DIAGNOSIS — M79641 Pain in right hand: Secondary | ICD-10-CM

## 2020-08-22 DIAGNOSIS — R278 Other lack of coordination: Secondary | ICD-10-CM

## 2020-08-22 DIAGNOSIS — M25641 Stiffness of right hand, not elsewhere classified: Secondary | ICD-10-CM

## 2020-08-22 DIAGNOSIS — R29898 Other symptoms and signs involving the musculoskeletal system: Secondary | ICD-10-CM

## 2020-08-22 NOTE — Therapy (Signed)
[place], [place], [address] Phone: [phone]   Fax:  [phone]  Occupational Therapy Treatment  Patient Details  Name: Madison Walsh MRN: [id number] Date of Birth: [birth year] Referring Provider (OT): Arther Abbott, MD   Encounter Date: 08/22/2020   OT End of Session - 08/22/20 1432    Visit Number 27    Number of Visits 32    Date for OT Re-Evaluation 09/06/20    Authorization Type [employer]    Authorization Time Period 60 visit limit. No copay    Authorization - Visit Number 25    Authorization - Number of Visits 55    OT Start Time 1030    OT Stop Time 1108    OT Time Calculation (min) 38 min    Activity Tolerance Patient tolerated treatment well    Behavior During Therapy WFL for tasks assessed/performed           Past Medical History:  Diagnosis Date  . Constipation   . GERD (gastroesophageal reflux disease)   . PONV (postoperative nausea and vomiting)   . Ulcer of esophagus     Past Surgical History:  Procedure Laterality Date  . CERVICAL CONIZATION W/BX  1983  . COLONOSCOPY  06/27/2011   Procedure: COLONOSCOPY;  Surgeon: Rogene Houston, MD;  [facility];  Service: Endoscopy;  Laterality: N/A;  12:00  . ESOPHAGOGASTRODUODENOSCOPY N/A 08/10/2015   Procedure: ESOPHAGOGASTRODUODENOSCOPY (EGD);  Surgeon: Rogene Houston, MD;  [facility];  Service: Endoscopy;  Laterality: N/A;  1:00  . FLEXOR TENOTOMY Right 05/09/2020   Procedure: Tenotomy flexor tendons right hand;  Surgeon: Carole Civil, MD;  Location: [facility];  Service: Orthopedics;  Laterality: Right;  . OPEN REDUCTION INTERNAL FIXATION (ORIF) DISTAL RADIAL FRACTURE Right 03/03/2020   Procedure: OPEN REDUCTION INTERNAL FIXATION (ORIF) RIGHT WRIST FRACTURE;  Surgeon: Carole Civil, MD;  Location: [facility];  Service: Orthopedics;  Laterality: Right;    There were no vitals filed for this visit.    Subjective Assessment - 08/22/20 1037    Subjective  S: I'm still working on it.    Currently in Pain? Yes    Pain Score 3     Pain Location Other (Comment)   forearm   Pain Orientation Right    Pain Descriptors / Indicators Sore    Pain Type Acute pain    Pain Onset 1 to 4 weeks ago    Pain Frequency Constant    Aggravating Factors  wrist flexion and extension stretches and exercises    Pain Relieving Factors not doing anything    Effect of Pain on Daily Activities no effect    Multiple Pain Sites No              [facility] OT Assessment - 08/22/20 1040      Assessment   Medical Diagnosis Tenotomy Flexor tendons      Precautions   Precautions Other (comment)    Precaution Comments Progress as tolerated    Other Brace/Splint Pt is wearing the flexion gloves twice a day.                    OT Treatments/Exercises (OP) - 08/22/20 1040      Exercises   Exercises Wrist;Hand;Theraputty      Hand Exercises   Other Hand Exercises Utilized tweezers in right hand to complete sustained pinch strength task while transferring 25 beads one at  a time from left side of table to right side.    Other Hand Exercises Sustained pinch strength activity completed with resistive clothespins; Starting with least resistance patient used 3 point pinch and held clothespin open for 10 seconds prior to placing on horizontal pole.   completed with yellow, red, green, and blue pins.     Modalities   Modalities Moist Heat;Paraffin      Moist Heat Therapy   Number Minutes Moist Heat 5 Minutes    Moist Heat Location Hand;Wrist      RUE Paraffin   Number Minutes Paraffin 5 Minutes    RUE Paraffin Location Hand    Comments with coban wrap to increase stretch      Fine Motor Coordination (Hand/Wrist)   Fine Motor Coordination Grooved pegs    Grooved pegs Utilize tweezers to place 9 pegs into grooved pegsboard with increased time and moderate difficulty focusing on wrist ROM.                     OT Short Term Goals - 08/15/20 1318      OT SHORT TERM GOAL #1   Title Patient will be educated and independent with HEP in order to safely mobilize her right hand in order to decrease risk of contracture and increase functional ROM.    Time 4    Period Weeks    Target Date 06/27/20      OT SHORT TERM GOAL #2   Title Patient will be provided a fabricated right hand dynamic splint in order to provided joint protection during healing phase of procedure while allowing for appropriate ROM.    Time 4    Period Weeks      OT SHORT TERM GOAL #3   Title Patient will improve RUE forearm, wrist, and hand P/ROM to WNL for improved functional use of RUE.    Time 4    Period Weeks      OT SHORT TERM GOAL #4   Title Patient will decrease pain to 3/10 or better in RUE.    Time 4    Period Weeks             OT Long Term Goals - 08/15/20 1318      OT LONG TERM GOAL #1   Title Patient will return to prior level of function using her RUE as dominant with daily tasks.    Baseline 12/1: pt reports she is 50% of functional use with RUE during daily tasks.    Time 8    Period Weeks    Status On-going      OT LONG TERM GOAL #2   Title Patient will improve RUE a/rom to [facility] for improved ability to write, drive, and complete desired daily tasks.    Time 8    Period Weeks    Status On-going      OT LONG TERM GOAL #3   Title Patient will improve RUE strength to 4/5 or better for improved ability to complete tasks such as picking up bags of groceries.    Time 8    Period Weeks      OT LONG TERM GOAL #4   Title Patient will improve RUE grip and pinch strength to [facility] in order to open up jars and containers.    Time 8    Period Weeks    Status On-going      OT LONG TERM GOAL #5   Title Patient will decrease her  nine hole peg test time by at least 20" while using her right hand to promote ability to fasten jewelery and write with her dominant right hand.    Baseline  06/21/20 goal revised for a specific time using nine hole peg test    Time 8    Period Weeks                 Plan - 08/22/20 1433    Clinical Impression Statement A: Focused session on sustained pinch strength utilizing tweezers and resistive clothespins. patient reports muscle fatigue as resistance of clothespins progressed. Able to monitior independently and take rest breaks as needed. Unable to complete entire pegboard due to time constraint. VC for form and technique were provided for activities.    Body Structure / Function / Physical Skills ADL;UE functional use;Fascial restriction;Pain;FMC;ROM;GMC;Coordination;IADL;Strength;Edema    Plan P: Sustained pinch and grip strengthening. COmplete grooved pegboard with tweezers and focus on wrist ROM.    Consulted and Agree with Plan of Care Patient           Patient will benefit from skilled therapeutic intervention in order to improve the following deficits and impairments:   Body Structure / Function / Physical Skills: ADL,UE functional use,Fascial restriction,Pain,FMC,ROM,GMC,Coordination,IADL,Strength,Edema       Visit Diagnosis: Other lack of coordination  Pain in right hand  Stiffness of right hand, not elsewhere classified  Other symptoms and signs involving the musculoskeletal system    Problem List Patient Active Problem List   Diagnosis Date Noted  . S/P carpal tunnel release and right hand flexor tenodesis multiple on 05/09/20 05/09/2020  . Contracture of joint of right forearm   . Complex regional pain syndrome i of right lower limb 04/28/2020  . S/P ORIF (open reduction internal fixation) fracture right wrist 03/03/20 03/07/2020  . Closed Barton's fracture of right radius   . Kyphosis of thoracic region 10/03/2017  . Erosive esophagitis 03/08/2013  . Unspecified constipation 03/08/2013    Ailene Ravel, OTR/L,CBIS  [phone]  08/22/2020, 2:36 PM  [facility] [address] [place], [place], [address] Phone: [phone]   Fax:  [phone]  Name: Madison Walsh MRN: [id number] Date of Birth: [birth year]

## 2020-08-24 ENCOUNTER — Ambulatory Visit (HOSPITAL_COMMUNITY): Payer: 59

## 2020-08-24 ENCOUNTER — Other Ambulatory Visit: Payer: Self-pay

## 2020-08-24 ENCOUNTER — Encounter (HOSPITAL_COMMUNITY): Payer: Self-pay

## 2020-08-24 DIAGNOSIS — M79641 Pain in right hand: Secondary | ICD-10-CM

## 2020-08-24 DIAGNOSIS — R278 Other lack of coordination: Secondary | ICD-10-CM

## 2020-08-24 DIAGNOSIS — M25641 Stiffness of right hand, not elsewhere classified: Secondary | ICD-10-CM

## 2020-08-24 DIAGNOSIS — R29898 Other symptoms and signs involving the musculoskeletal system: Secondary | ICD-10-CM

## 2020-08-24 NOTE — Therapy (Signed)
[place], [place], [address] Phone: [phone]   Fax:  [phone]  Occupational Therapy Treatment  Patient Details  Name: Madison Walsh MRN: [id number] Date of Birth: [birth year] Referring Provider (OT): Arther Abbott, MD   Encounter Date: 08/24/2020   OT End of Session - 08/24/20 1335    Visit Number 28    Number of Visits 32    Date for OT Re-Evaluation 09/06/20    Authorization Type [employer]    Authorization Time Period 60 visit limit. No copay    Authorization - Visit Number 26    Authorization - Number of Visits 75    OT Start Time 4268    OT Stop Time 1115    OT Time Calculation (min) 40 min    Activity Tolerance Patient tolerated treatment well    Behavior During Therapy WFL for tasks assessed/performed           Past Medical History:  Diagnosis Date  . Constipation   . GERD (gastroesophageal reflux disease)   . PONV (postoperative nausea and vomiting)   . Ulcer of esophagus     Past Surgical History:  Procedure Laterality Date  . CERVICAL CONIZATION W/BX  1983  . COLONOSCOPY  06/27/2011   Procedure: COLONOSCOPY;  Surgeon: Rogene Houston, MD;  [facility];  Service: Endoscopy;  Laterality: N/A;  12:00  . ESOPHAGOGASTRODUODENOSCOPY N/A 08/10/2015   Procedure: ESOPHAGOGASTRODUODENOSCOPY (EGD);  Surgeon: Rogene Houston, MD;  [facility];  Service: Endoscopy;  Laterality: N/A;  1:00  . FLEXOR TENOTOMY Right 05/09/2020   Procedure: Tenotomy flexor tendons right hand;  Surgeon: Carole Civil, MD;  Location: [facility];  Service: Orthopedics;  Laterality: Right;  . OPEN REDUCTION INTERNAL FIXATION (ORIF) DISTAL RADIAL FRACTURE Right 03/03/2020   Procedure: OPEN REDUCTION INTERNAL FIXATION (ORIF) RIGHT WRIST FRACTURE;  Surgeon: Carole Civil, MD;  Location: [facility];  Service: Orthopedics;  Laterality: Right;    There were no vitals filed for this visit.    Subjective Assessment - 08/24/20 1040    Currently in Pain? Yes    Pain Score 4     Pain Location Other (Comment)   forearm and shoulder   Pain Orientation Right    Pain Descriptors / Indicators Sore    Pain Type Acute pain    Pain Radiating Towards N/A    Pain Onset 1 to 4 weeks ago    Pain Frequency Constant    Aggravating Factors  wrist flexion and extension stretches and exercises. Doing more with this at home.    Pain Relieving Factors rest    Effect of Pain on Daily Activities no effect              [facility] OT Assessment - 08/24/20 1042      Assessment   Medical Diagnosis Tenotomy Flexor tendons      Precautions   Precautions Other (comment)    Precaution Comments Progress as tolerated    Other Brace/Splint Pt is wearing the flexion gloves twice a day.                    OT Treatments/Exercises (OP) - 08/24/20 1042      Exercises   Exercises Wrist;Hand;Theraputty      Additional Wrist Exercises   Hand Gripper with Large Beads all beads with gripper set at 11# with 10" hold before release   horizontal   Hand Gripper  with Medium Beads all beads with gripper set at 11# with 10 second hold before release   horizontal   Hand Gripper with Small Beads all beads with gripper set at 11#   horizontal     Modalities   Modalities Moist Heat;Paraffin      Moist Heat Therapy   Number Minutes Moist Heat 5 Minutes    Moist Heat Location Hand;Wrist      RUE Paraffin   Number Minutes Paraffin 5 Minutes    RUE Paraffin Location Hand      Fine Motor Coordination (Hand/Wrist)   Fine Motor Coordination Grooved pegs    Grooved pegs Utilized tweezer in a functional tripod grasp to pick up pegs and place in board focusing on wrist ROM with increased time.                    OT Short Term Goals - 08/15/20 1318      OT SHORT TERM GOAL #1   Title Patient will be educated and independent with HEP in order to safely mobilize her right hand in order to decrease  risk of contracture and increase functional ROM.    Time 4    Period Weeks    Target Date 06/27/20      OT SHORT TERM GOAL #2   Title Patient will be provided a fabricated right hand dynamic splint in order to provided joint protection during healing phase of procedure while allowing for appropriate ROM.    Time 4    Period Weeks      OT SHORT TERM GOAL #3   Title Patient will improve RUE forearm, wrist, and hand P/ROM to WNL for improved functional use of RUE.    Time 4    Period Weeks      OT SHORT TERM GOAL #4   Title Patient will decrease pain to 3/10 or better in RUE.    Time 4    Period Weeks             OT Long Term Goals - 08/15/20 1318      OT LONG TERM GOAL #1   Title Patient will return to prior level of function using her RUE as dominant with daily tasks.    Baseline 12/1: pt reports she is 50% of functional use with RUE during daily tasks.    Time 8    Period Weeks    Status On-going      OT LONG TERM GOAL #2   Title Patient will improve RUE a/rom to [facility] for improved ability to write, drive, and complete desired daily tasks.    Time 8    Period Weeks    Status On-going      OT LONG TERM GOAL #3   Title Patient will improve RUE strength to 4/5 or better for improved ability to complete tasks such as picking up bags of groceries.    Time 8    Period Weeks      OT LONG TERM GOAL #4   Title Patient will improve RUE grip and pinch strength to [facility] in order to open up jars and containers.    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  Baseline 06/21/20 goal revised for a specific time using nine hole peg test    Time 8    Period Weeks                 Plan - 08/24/20 1335    Clinical Impression Statement A: Able to complete grooved pegboard  task completely with tweezers and required increased time to complete due to mod-max difficulty.Able to complete sustained grip strengthening task using handgripper while also able to pick up all medium beads with 11# of resistance. VC for form and technique were provided. Increased time and rest breaks taken as needed.    Body Structure / Function / Physical Skills ADL;UE functional use;Fascial restriction;Pain;FMC;ROM;GMC;Coordination;IADL;Strength;Edema    Plan P: Continue with sustained pinch and grip strengthening    Consulted and Agree with Plan of Care Patient           Patient will benefit from skilled therapeutic intervention in order to improve the following deficits and impairments:   Body Structure / Function / Physical Skills: ADL,UE functional use,Fascial restriction,Pain,FMC,ROM,GMC,Coordination,IADL,Strength,Edema       Visit Diagnosis: Pain in right hand  Stiffness of right hand, not elsewhere classified  Other symptoms and signs involving the musculoskeletal system  Other lack of coordination    Problem List Patient Active Problem List   Diagnosis Date Noted  . S/P carpal tunnel release and right hand flexor tenodesis multiple on 05/09/20 05/09/2020  . Contracture of joint of right forearm   . Complex regional pain syndrome i of right lower limb 04/28/2020  . S/P ORIF (open reduction internal fixation) fracture right wrist 03/03/20 03/07/2020  . Closed Barton's fracture of right radius   . Kyphosis of thoracic region 10/03/2017  . Erosive esophagitis 03/08/2013  . Unspecified constipation 03/08/2013   Ailene Ravel, OTR/L,CBIS  [phone]  08/24/2020, 2:05 PM  [facility] [address] [place], [place], [address] Phone: [phone]   Fax:  [phone]  Name: Madison Walsh MRN: [id number] Date of Birth: 24-Apr-1961

## 2020-08-29 ENCOUNTER — Ambulatory Visit (HOSPITAL_COMMUNITY): Payer: 59

## 2020-08-29 ENCOUNTER — Other Ambulatory Visit: Payer: Self-pay

## 2020-08-29 ENCOUNTER — Encounter (HOSPITAL_COMMUNITY): Payer: Self-pay

## 2020-08-29 DIAGNOSIS — M79641 Pain in right hand: Secondary | ICD-10-CM

## 2020-08-29 DIAGNOSIS — M25641 Stiffness of right hand, not elsewhere classified: Secondary | ICD-10-CM

## 2020-08-29 DIAGNOSIS — R29898 Other symptoms and signs involving the musculoskeletal system: Secondary | ICD-10-CM

## 2020-08-29 DIAGNOSIS — R278 Other lack of coordination: Secondary | ICD-10-CM

## 2020-08-29 NOTE — Therapy (Signed)
[place], [place], [address] Phone: [phone]   Fax:  [phone]  Occupational Therapy Treatment  Patient Details  Name: Madison Walsh MRN: [id number] Date of Birth: 05/06/1961 Referring Provider (OT): Arther Abbott, MD   Encounter Date: 08/29/2020   OT End of Session - 08/29/20 1319    Visit Number 29    Number of Visits 32    Date for OT Re-Evaluation 09/06/20    Authorization Type [employer]    Authorization Time Period 60 visit limit. No copay    Authorization - Visit Number 27    Authorization - Number of Visits 60    OT Start Time 8250    OT Stop Time 1113    OT Time Calculation (min) 38 min    Activity Tolerance Patient tolerated treatment well    Behavior During Therapy WFL for tasks assessed/performed           Past Medical History:  Diagnosis Date  . Constipation   . GERD (gastroesophageal reflux disease)   . PONV (postoperative nausea and vomiting)   . Ulcer of esophagus     Past Surgical History:  Procedure Laterality Date  . CERVICAL CONIZATION W/BX  1983  . COLONOSCOPY  06/27/2011   Procedure: COLONOSCOPY;  Surgeon: Rogene Houston, MD;  [facility];  Service: Endoscopy;  Laterality: N/A;  12:00  . ESOPHAGOGASTRODUODENOSCOPY N/A 08/10/2015   Procedure: ESOPHAGOGASTRODUODENOSCOPY (EGD);  Surgeon: Rogene Houston, MD;  [facility];  Service: Endoscopy;  Laterality: N/A;  1:00  . FLEXOR TENOTOMY Right 05/09/2020   Procedure: Tenotomy flexor tendons right hand;  Surgeon: Carole Civil, MD;  Location: [facility];  Service: Orthopedics;  Laterality: Right;  . OPEN REDUCTION INTERNAL FIXATION (ORIF) DISTAL RADIAL FRACTURE Right 03/03/2020   Procedure: OPEN REDUCTION INTERNAL FIXATION (ORIF) RIGHT WRIST FRACTURE;  Surgeon: Carole Civil, MD;  Location: [facility];  Service: Orthopedics;  Laterality: Right;    There were no vitals filed for this visit.    Subjective Assessment - 08/29/20 1041    Subjective  S: I was able to pinch the pant hanger clip for the first time.    Currently in Pain? Yes    Pain Score 4     Pain Location Other (Comment)   forearm   Pain Orientation Right    Pain Descriptors / Indicators Sore    Pain Type Acute pain    Pain Onset 1 to 4 weeks ago    Pain Frequency Constant    Aggravating Factors  Wrist flexion and extension stretches and exercises. Doing more with it at home.    Pain Relieving Factors Rest and heat    Effect of Pain on Daily Activities no effect                        OT Treatments/Exercises (OP) - 08/29/20 1054      Exercises   Exercises Wrist;Hand;Theraputty      Hand Exercises   Other Hand Exercises Holding red putty in both hands palms down imitated wringing a washcloth while focusing on wrist extension.    Other Hand Exercises Utilized pvc pipe to press circles into putty focusing on wrist flexion/extension and grip strength.      Theraputty   Theraputty - Flatten red standing focusing on wrist extension    Theraputty - Roll red    Theraputty - Grip prontated red  Modalities   Modalities Moist Heat;Paraffin      Moist Heat Therapy   Number Minutes Moist Heat 5 Minutes    Moist Heat Location Hand;Wrist      RUE Paraffin   Number Minutes Paraffin 5 Minutes    RUE Paraffin Location Hand                  OT Education - 08/29/20 1318    Education Details Provided red theraputty to progress HEP at home.    Person(s) Educated Patient    Methods Explanation    Comprehension Verbalized understanding            OT Short Term Goals - 08/15/20 1318      OT SHORT TERM GOAL #1   Title Patient will be educated and independent with HEP in order to safely mobilize her right hand in order to decrease risk of contracture and increase functional ROM.    Time 4    Period Weeks    Target Date 06/27/20      OT SHORT TERM GOAL #2   Title Patient will be  provided a fabricated right hand dynamic splint in order to provided joint protection during healing phase of procedure while allowing for appropriate ROM.    Time 4    Period Weeks      OT SHORT TERM GOAL #3   Title Patient will improve RUE forearm, wrist, and hand P/ROM to WNL for improved functional use of RUE.    Time 4    Period Weeks      OT SHORT TERM GOAL #4   Title Patient will decrease pain to 3/10 or better in RUE.    Time 4    Period Weeks             OT Long Term Goals - 08/15/20 1318      OT LONG TERM GOAL #1   Title Patient will return to prior level of function using her RUE as dominant with daily tasks.    Baseline 12/1: pt reports she is 50% of functional use with RUE during daily tasks.    Time 8    Period Weeks    Status On-going      OT LONG TERM GOAL #2   Title Patient will improve RUE a/rom to [facility] for improved ability to write, drive, and complete desired daily tasks.    Time 8    Period Weeks    Status On-going      OT LONG TERM GOAL #3   Title Patient will improve RUE strength to 4/5 or better for improved ability to complete tasks such as picking up bags of groceries.    Time 8    Period Weeks      OT LONG TERM GOAL #4   Title Patient will improve RUE grip and pinch strength to [facility] in order to open up jars and containers.    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    Baseline 06/21/20 goal revised for a specific time using nine hole peg test    Time 8    Period Weeks  Plan - 08/29/20 1319    Clinical Impression Statement A: Continued to focus on sustained grip and pinch strengthening with use of theraputty. Pt completed sustained grip and pinch strengthening while progressing putty resistance to red. Provided red  putty for progression of HEP. VC for form and technique were provided.    Body Structure / Function / Physical Skills ADL;UE functional use;Fascial restriction;Pain;FMC;ROM;GMC;Coordination;IADL;Strength;Edema    Plan P: Continue with sustained pinch and grip strengthening    Consulted and Agree with Plan of Care Patient           Patient will benefit from skilled therapeutic intervention in order to improve the following deficits and impairments:   Body Structure / Function / Physical Skills: ADL,UE functional use,Fascial restriction,Pain,FMC,ROM,GMC,Coordination,IADL,Strength,Edema       Visit Diagnosis: Pain in right hand  Stiffness of right hand, not elsewhere classified  Other symptoms and signs involving the musculoskeletal system  Other lack of coordination    Problem List Patient Active Problem List   Diagnosis Date Noted  . S/P carpal tunnel release and right hand flexor tenodesis multiple on 05/09/20 05/09/2020  . Contracture of joint of right forearm   . Complex regional pain syndrome i of right lower limb 04/28/2020  . S/P ORIF (open reduction internal fixation) fracture right wrist 03/03/20 03/07/2020  . Closed Barton's fracture of right radius   . Kyphosis of thoracic region 10/03/2017  . Erosive esophagitis 03/08/2013  . Unspecified constipation 03/08/2013   Limmie Patricia, OTR/L,CBIS  [phone]  08/29/2020, 3:09 PM  [facility] [address] [place], [place], [address] Phone: [phone]   Fax:  [phone]  Name: Madison Walsh MRN: [id number] Date of Birth: 1960-10-07

## 2020-08-31 ENCOUNTER — Encounter (HOSPITAL_COMMUNITY): Payer: Self-pay

## 2020-08-31 ENCOUNTER — Ambulatory Visit (HOSPITAL_COMMUNITY): Payer: 59

## 2020-08-31 ENCOUNTER — Other Ambulatory Visit: Payer: Self-pay

## 2020-08-31 DIAGNOSIS — M25641 Stiffness of right hand, not elsewhere classified: Secondary | ICD-10-CM

## 2020-08-31 DIAGNOSIS — M79641 Pain in right hand: Secondary | ICD-10-CM

## 2020-08-31 DIAGNOSIS — R29898 Other symptoms and signs involving the musculoskeletal system: Secondary | ICD-10-CM

## 2020-08-31 DIAGNOSIS — R278 Other lack of coordination: Secondary | ICD-10-CM

## 2020-08-31 NOTE — Therapy (Signed)
[place], [place], [address] Phone: [phone]   Fax:  [phone]  Occupational Therapy Treatment  Patient Details  Name: Madison Walsh MRN: [id number] Date of Birth: Apr 17, 1961 Referring Provider (OT): Arther Abbott, MD   Encounter Date: 08/31/2020   OT End of Session - 08/31/20 1227    Visit Number 30    Number of Visits 32    Date for OT Re-Evaluation 09/06/20    Authorization Type [employer]    Authorization Time Period 60 visit limit. No copay    Authorization - Visit Number 28    Authorization - Number of Visits 55    OT Start Time 0923    OT Stop Time 1112    OT Time Calculation (min) 34 min    Activity Tolerance Patient tolerated treatment well    Behavior During Therapy WFL for tasks assessed/performed           Past Medical History:  Diagnosis Date  . Constipation   . GERD (gastroesophageal reflux disease)   . PONV (postoperative nausea and vomiting)   . Ulcer of esophagus     Past Surgical History:  Procedure Laterality Date  . CERVICAL CONIZATION W/BX  1983  . COLONOSCOPY  06/27/2011   Procedure: COLONOSCOPY;  Surgeon: Rogene Houston, MD;  [facility];  Service: Endoscopy;  Laterality: N/A;  12:00  . ESOPHAGOGASTRODUODENOSCOPY N/A 08/10/2015   Procedure: ESOPHAGOGASTRODUODENOSCOPY (EGD);  Surgeon: Rogene Houston, MD;  [facility];  Service: Endoscopy;  Laterality: N/A;  1:00  . FLEXOR TENOTOMY Right 05/09/2020   Procedure: Tenotomy flexor tendons right hand;  Surgeon: Carole Civil, MD;  Location: [facility];  Service: Orthopedics;  Laterality: Right;  . OPEN REDUCTION INTERNAL FIXATION (ORIF) DISTAL RADIAL FRACTURE Right 03/03/2020   Procedure: OPEN REDUCTION INTERNAL FIXATION (ORIF) RIGHT WRIST FRACTURE;  Surgeon: Carole Civil, MD;  Location: [facility];  Service: Orthopedics;  Laterality: Right;    There were no vitals filed for this visit.    Subjective Assessment - 08/31/20 1049    Subjective  S:    Currently in Pain? Yes    Pain Score 4     Pain Location Other (Comment)   forearm   Pain Orientation Right    Pain Descriptors / Indicators Sore    Pain Type Acute pain    Pain Onset 1 to 4 weeks ago    Pain Frequency Constant    Aggravating Factors  wrist flexion and extension stretches and exercises. Doing more with it at home.    Pain Relieving Factors rest and heat    Effect of Pain on Daily Activities no effect              [facility] OT Assessment - 08/31/20 1053      Assessment   Medical Diagnosis Tenotomy Flexor tendons      Precautions   Precautions Other (comment)    Precaution Comments Progress as tolerated    Other Brace/Splint Pt is wearing the flexion gloves twice a day.                    OT Treatments/Exercises (OP) - 08/31/20 1053      Exercises   Exercises Wrist;Hand;Theraputty      Additional Wrist Exercises   Hand Gripper with Large Beads all beads with gripper set at 11# with 10" hold before release   with 10" hold  before release  Hand Gripper with Medium Beads all beads with gripper set at 11# with 10 second hold before release   with 10" hold before release     Theraputty   Theraputty - Pinch red - lateral pinch    Theraputty Hand- Locate Pegs 8/8 beads located - red      Modalities   Modalities Moist Heat;Paraffin      Moist Heat Therapy   Number Minutes Moist Heat 5 Minutes    Moist Heat Location Hand;Wrist      RUE Paraffin   Number Minutes Paraffin 5 Minutes    RUE Paraffin Location Hand                    OT Short Term Goals - 08/15/20 1318      OT SHORT TERM GOAL #1   Title Patient will be educated and independent with HEP in order to safely mobilize her right hand in order to decrease risk of contracture and increase functional ROM.    Time 4    Period Weeks    Target Date 06/27/20      OT SHORT TERM GOAL #2   Title Patient will be provided a  fabricated right hand dynamic splint in order to provided joint protection during healing phase of procedure while allowing for appropriate ROM.    Time 4    Period Weeks      OT SHORT TERM GOAL #3   Title Patient will improve RUE forearm, wrist, and hand P/ROM to WNL for improved functional use of RUE.    Time 4    Period Weeks      OT SHORT TERM GOAL #4   Title Patient will decrease pain to 3/10 or better in RUE.    Time 4    Period Weeks             OT Long Term Goals - 08/15/20 1318      OT LONG TERM GOAL #1   Title Patient will return to prior level of function using her RUE as dominant with daily tasks.    Baseline 12/1: pt reports she is 50% of functional use with RUE during daily tasks.    Time 8    Period Weeks    Status On-going      OT LONG TERM GOAL #2   Title Patient will improve RUE a/rom to [facility] for improved ability to write, drive, and complete desired daily tasks.    Time 8    Period Weeks    Status On-going      OT LONG TERM GOAL #3   Title Patient will improve RUE strength to 4/5 or better for improved ability to complete tasks such as picking up bags of groceries.    Time 8    Period Weeks      OT LONG TERM GOAL #4   Title Patient will improve RUE grip and pinch strength to [facility] in order to open up jars and containers.    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    Baseline 06/21/20 goal revised for a specific time using nine hole peg test    Time 8    Period Weeks  Plan - 08/31/20 1234    Clinical Impression Statement A: Utilized red theraputty and handgripper to continue working on sustained grip and pinch strength. Pt reports that she able to turn the key in her car to start it. It is more difficulty turning it on  versus off because of her wrist mobility. Did provide patient with two key holder adapters from [employer] that may help with task if needed.    Body Structure / Function / Physical Skills ADL;UE functional use;Fascial restriction;Pain;FMC;ROM;GMC;Coordination;IADL;Strength;Edema    Plan P: Continue with sustained pinch and grip strengthening    Consulted and Agree with Plan of Care Patient           Patient will benefit from skilled therapeutic intervention in order to improve the following deficits and impairments:   Body Structure / Function / Physical Skills: ADL,UE functional use,Fascial restriction,Pain,FMC,ROM,GMC,Coordination,IADL,Strength,Edema       Visit Diagnosis: Pain in right hand  Stiffness of right hand, not elsewhere classified  Other symptoms and signs involving the musculoskeletal system  Other lack of coordination    Problem List Patient Active Problem List   Diagnosis Date Noted  . S/P carpal tunnel release and right hand flexor tenodesis multiple on 05/09/20 05/09/2020  . Contracture of joint of right forearm   . Complex regional pain syndrome i of right lower limb 04/28/2020  . S/P ORIF (open reduction internal fixation) fracture right wrist 03/03/20 03/07/2020  . Closed Barton's fracture of right radius   . Kyphosis of thoracic region 10/03/2017  . Erosive esophagitis 03/08/2013  . Unspecified constipation 03/08/2013   Ailene Ravel, OTR/L,CBIS  [phone]  08/31/2020, 12:42 PM  [facility] [address] [place], [place], [address] Phone: [phone]   Fax:  [phone]  Name: DESHANTI ADCOX MRN: [id number] Date of Birth: 1961/04/17

## 2020-09-05 ENCOUNTER — Other Ambulatory Visit: Payer: Self-pay

## 2020-09-05 ENCOUNTER — Ambulatory Visit (HOSPITAL_COMMUNITY): Payer: 59 | Admitting: Occupational Therapy

## 2020-09-05 ENCOUNTER — Encounter (HOSPITAL_COMMUNITY): Payer: Self-pay | Admitting: Occupational Therapy

## 2020-09-05 DIAGNOSIS — M79641 Pain in right hand: Secondary | ICD-10-CM | POA: Diagnosis not present

## 2020-09-05 DIAGNOSIS — M25641 Stiffness of right hand, not elsewhere classified: Secondary | ICD-10-CM

## 2020-09-05 DIAGNOSIS — R29898 Other symptoms and signs involving the musculoskeletal system: Secondary | ICD-10-CM

## 2020-09-05 NOTE — Therapy (Signed)
[place], [place], [address] Phone: [phone]   Fax:  [phone]  Occupational Therapy Reassessment, Treatment, Discharge Summary   Patient Details  Name: Madison Walsh MRN: [id number] Date of Birth: [birth year] Referring Provider (OT): Arther Abbott, MD    [facility] OT Assessment - 09/05/20 [phone]      Assessment   Medical Diagnosis Tenotomy Flexor tendons      Precautions   Precautions Other (comment)    Precaution Comments Progress as tolerated    Other Brace/Splint Pt is wearing the flexion glove once a day.      Observation/Other Assessments   Quick DASH  34.09   47.73 previous     AROM   Right Forearm Pronation 82 Degrees   75 previous   Right Forearm Supination 90 Degrees   80 previous   Right Wrist Extension 52 Degrees   same as previous   Right Wrist Flexion 40 Degrees   46 previous   Right Wrist Radial Deviation 30 Degrees   same as previous   Right Wrist Ulnar Deviation 20 Degrees   18 previous   Right Composite Finger Flexion 75%      Strength   Right Forearm Pronation 5/5   4+/5 previous   Right Forearm Supination 5/5   4+/5 previous   Right Wrist Flexion 4+/5   4/5 previous   Right Wrist Extension 5/5   same as previous   Right Wrist Radial Deviation 5/5   same as previous   Right Wrist Ulnar Deviation 5/5   same as previous   Right Hand Grip (lbs) 20   15 previous   Right Hand Lateral Pinch 10 lbs   7 previous   Right Hand 3 Point Pinch 5 lbs   4 previous     Right Hand AROM   R Thumb MCP 0-60 52 Degrees   62 previous   R Thumb IP 0-80 40 Degrees   34 previous   R Index  MCP 0-90 70 Degrees   68 previous   R Index PIP 0-100 80 Degrees   82 previous   R Index DIP 0-70 44 Degrees   42 previous   R Long  MCP 0-90 78 Degrees   same as previous   R Long PIP 0-100 86 Degrees   80 previous   R Long DIP 0-70 40 Degrees   48 previous   R Ring  MCP 0-90 70 Degrees   72 previous   R Ring PIP  0-100 90 Degrees   92 previous   R Ring DIP 0-70 28 Degrees   same as previous   R Little  MCP 0-90 70 Degrees   80 previous   R Little PIP 0-100 84 Degrees   90 previous   R Little DIP 0-70 40 Degrees   36 previous            Encounter Date: 09/05/2020   OT End of Session - 09/05/20 1025    Visit Number 31    Number of Visits 32    Date for OT Re-Evaluation 09/06/20    Authorization Type [employer]    Authorization Time Period 60 visit limit. No copay    Authorization - Visit Number 29    Authorization - Number of Visits 60    OT Start Time 0948    OT Stop Time 1028    OT Time Calculation (min) 40 min    Activity Tolerance Patient  tolerated treatment well    Behavior During Therapy [facility] for tasks assessed/performed           Past Medical History:  Diagnosis Date  . Constipation   . GERD (gastroesophageal reflux disease)   . PONV (postoperative nausea and vomiting)   . Ulcer of esophagus     Past Surgical History:  Procedure Laterality Date  . CERVICAL CONIZATION W/BX  1983  . COLONOSCOPY  06/27/2011   Procedure: COLONOSCOPY;  Surgeon: Rogene Houston, MD;  [facility];  Service: Endoscopy;  Laterality: N/A;  12:00  . ESOPHAGOGASTRODUODENOSCOPY N/A 08/10/2015   Procedure: ESOPHAGOGASTRODUODENOSCOPY (EGD);  Surgeon: Rogene Houston, MD;  Location: [facility];  Service: Endoscopy;  Laterality: N/A;  1:00  . FLEXOR TENOTOMY Right 05/09/2020   Procedure: Tenotomy flexor tendons right hand;  Surgeon: Carole Civil, MD;  Location: [facility];  Service: Orthopedics;  Laterality: Right;  . OPEN REDUCTION INTERNAL FIXATION (ORIF) DISTAL RADIAL FRACTURE Right 03/03/2020   Procedure: OPEN REDUCTION INTERNAL FIXATION (ORIF) RIGHT WRIST FRACTURE;  Surgeon: Carole Civil, MD;  Location: [facility];  Service: Orthopedics;  Laterality: Right;    There were no vitals filed for this visit.   Subjective Assessment - 09/05/20 0948    Subjective  S: It's  getting better with everything. There are a few things I have difficulty with.    Currently in Pain? Yes    Pain Score 3     Pain Location --   forearm   Pain Orientation Right    Pain Descriptors / Indicators Sore    Pain Type Acute pain    Pain Radiating Towards N/A    Pain Onset 1 to 4 weeks ago    Pain Frequency Constant    Aggravating Factors  increased use    Pain Relieving Factors rest and heat    Effect of Pain on Daily Activities no effect    Multiple Pain Sites No                Katina Dung - 09/05/20 0953    Open a tight or new jar Severe difficulty    Do heavy household chores (wash walls, wash floors) No difficulty    Carry a shopping bag or briefcase Moderate difficulty    Wash your back Severe difficulty    Use a knife to cut food Mild difficulty    Recreational activities in which you take some force or impact through your arm, shoulder, or hand (golf, hammering, tennis) Moderate difficulty    During the past week, to what extent has your arm, shoulder or hand problem interfered with your normal social activities with family, friends, neighbors, or groups? Not at all    During the past week, to what extent has your arm, shoulder or hand problem limited your work or other regular daily activities Quite a bit    Arm, shoulder, or hand pain. Mild    Tingling (pins and needles) in your arm, shoulder, or hand None    Difficulty Sleeping No difficulty    DASH Score 34.09 %                OT Treatments/Exercises (OP) - 09/05/20 1019      ADLs   ADL Comments Extensive discussion on progress and functional use of RUE. Discussed compensatory techniques for difficult tasks and problem-solved functional tasks. Provided handouts for fine motor strengthening/coordination and hand strengthening.      Exercises   Exercises  Theraputty      Hand Exercises   Other Hand Exercises Utilized pvc pipe to press circles into red putty focusing on wrist flexion/extension and  grip strength.                    OT Short Term Goals - 08/15/20 1318      OT SHORT TERM GOAL #1   Title Patient will be educated and independent with HEP in order to safely mobilize her right hand in order to decrease risk of contracture and increase functional ROM.    Time 4    Period Weeks    Target Date 06/27/20      OT SHORT TERM GOAL #2   Title Patient will be provided a fabricated right hand dynamic splint in order to provided joint protection during healing phase of procedure while allowing for appropriate ROM.    Time 4    Period Weeks      OT SHORT TERM GOAL #3   Title Patient will improve RUE forearm, wrist, and hand P/ROM to WNL for improved functional use of RUE.    Time 4    Period Weeks      OT SHORT TERM GOAL #4   Title Patient will decrease pain to 3/10 or better in RUE.    Time 4    Period Weeks             OT Long Term Goals - 09/05/20 1012      OT LONG TERM GOAL #1   Title Patient will return to prior level of function using her RUE as dominant with daily tasks.    Baseline 12/28: pt reports she is 70% of functional use with RUE during daily tasks.    Time 8    Period Weeks    Status Partially Met      OT LONG TERM GOAL #2   Title Patient will improve RUE a/rom to [facility] for improved ability to write, drive, and complete desired daily tasks.    Time 8    Period Weeks    Status Achieved      OT LONG TERM GOAL #3   Title Patient will improve RUE strength to 4/5 or better for improved ability to complete tasks such as picking up bags of groceries.    Time 8    Period Weeks    Status Achieved      OT LONG TERM GOAL #4   Title Patient will improve RUE grip and pinch strength to [facility] in order to open up jars and containers.    Time 8    Period Weeks    Status Partially Met      OT LONG TERM GOAL #5   Title Patient will decrease her nine hole peg test time by at least 20" while using her right hand to promote ability to fasten jewelery  and write with her dominant right hand.    Baseline 06/21/20 goal revised for a specific time using nine hole peg test    Time 8    Period Weeks                 Plan - 09/05/20 1025    Clinical Impression Statement A: Reassessment completed this session, pt has met all STGs and 3/5 LTGs with remaining LTGs partially met. Pt reports she is using her RUE as dominant with approximately 70% of tasks, continues to have difficulty with turning a key, opening jars, and cutting  her nails. Pt reports compensatory techniques she uses to adapt these tasks. Discussed progress and RUE functioning and updated HEP to continue working towards improving grip and pinch strength. Pt continues to have a stitch in her palm that limits the amount of pressure she can put through her hand, will discuss with MD at upcoming appt. Pt is agreeable to discharge today.    Body Structure / Function / Physical Skills ADL;UE functional use;Fascial restriction;Pain;FMC;ROM;GMC;Coordination;IADL;Strength;Edema    Plan P: Discharge pt    OT Home Exercise Plan eval: passive finger flexion with active extension. Splint education; 10/27: hand/finger A/ROM; 11/10: tendon glides, educated on flexion glove use 12/7: HEP updated this date. 12/28: hand and fine motor strengthening    Consulted and Agree with Plan of Care Patient           Patient will benefit from skilled therapeutic intervention in order to improve the following deficits and impairments:   Body Structure / Function / Physical Skills: ADL,UE functional use,Fascial restriction,Pain,FMC,ROM,GMC,Coordination,IADL,Strength,Edema       Visit Diagnosis: Pain in right hand  Stiffness of right hand, not elsewhere classified  Other symptoms and signs involving the musculoskeletal system    Problem List Patient Active Problem List   Diagnosis Date Noted  . S/P carpal tunnel release and right hand flexor tenodesis multiple on 05/09/20 05/09/2020  . Contracture  of joint of right forearm   . Complex regional pain syndrome i of right lower limb 04/28/2020  . S/P ORIF (open reduction internal fixation) fracture right wrist 03/03/20 03/07/2020  . Closed Barton's fracture of right radius   . Kyphosis of thoracic region 10/03/2017  . Erosive esophagitis 03/08/2013  . Unspecified constipation 03/08/2013   Guadelupe Sabin, OTR/L  [phone] 09/05/2020, 10:35 AM  [place] [address] [place], [place], [address] Phone: [phone]   Fax:  [phone]  Name: Madison Walsh MRN: [id number] Date of Birth: 1961-03-09   OCCUPATIONAL THERAPY DISCHARGE SUMMARY  Visits from Start of Care: 31  Current functional level related to goals / functional outcomes: See above. Pt has met all STGs and 3/5 LTGs with remaining LTGs partially met. Pt reports she is using her RUE as dominant with approximately 70% of tasks, continues to have difficulty with turning a key, opening jars, and cutting her nails. Pt reports compensatory techniques she uses to adapt these tasks.   Remaining deficits: Decreased grip and pinch strength, decreased DIP flexion   Education / Equipment: HEP for grip and pinch strengthening, fine motor coordination and strengthening Plan: Patient agrees to discharge.  Patient goals were met. Patient is being discharged due to meeting the stated rehab goals.  ?????

## 2020-09-07 ENCOUNTER — Ambulatory Visit (HOSPITAL_COMMUNITY): Payer: 59 | Admitting: Occupational Therapy

## 2020-09-07 ENCOUNTER — Encounter (HOSPITAL_COMMUNITY): Payer: 59

## 2020-09-14 ENCOUNTER — Encounter: Payer: Self-pay | Admitting: Orthopedic Surgery

## 2020-09-14 ENCOUNTER — Other Ambulatory Visit: Payer: Self-pay

## 2020-09-14 ENCOUNTER — Ambulatory Visit (INDEPENDENT_AMBULATORY_CARE_PROVIDER_SITE_OTHER): Payer: 59 | Admitting: Orthopedic Surgery

## 2020-09-14 VITALS — BP 118/70 | HR 71 | Ht 62.0 in | Wt 129.0 lb

## 2020-09-14 DIAGNOSIS — G90511 Complex regional pain syndrome I of right upper limb: Secondary | ICD-10-CM | POA: Diagnosis not present

## 2020-09-14 DIAGNOSIS — M25511 Pain in right shoulder: Secondary | ICD-10-CM

## 2020-09-14 DIAGNOSIS — Z9889 Other specified postprocedural states: Secondary | ICD-10-CM | POA: Diagnosis not present

## 2020-09-14 DIAGNOSIS — Z8781 Personal history of (healed) traumatic fracture: Secondary | ICD-10-CM | POA: Diagnosis not present

## 2020-09-14 NOTE — Progress Notes (Signed)
Chief Complaint  Patient presents with  . Post-op Follow-up      05/09/20 right hand flexor tendon releases improving , orif wrist 03/03/20    Last visit: 60 year old female right-handed types at work status post ORIF right wrist for Barton's fracture dislocation developed flexor tendon contractures which were released on August 31 index surgery on June 25   She is making progress still having a lot of trouble but with her pronation and supination with some ulnar-sided wrist pain her RSD symptoms have improved significantly where she only has a little bit of tingling at times and occasionally temperature changes in the hands she is on clonidine no complications from that she is not requiring any other medications at this time   Recommend she continue with functional activity training she does have a ergonomic keyboard which should help  Today:    Encounter Diagnoses  Name Primary?  . S/P carpal tunnel release and right hand flexor tenodesis multiple on 05/09/20 Yes  . S/P ORIF (open reduction internal fixation) fracture right wrist 03/03/20   . Complex regional pain syndrome type 1 of right upper extremity   . Acute pain of right shoulder     Gavin Pound is made significant progress she can type now with a ergonomic keyboard she has some hypersensitivity on the volar side of the wrist on the radial side of the incision she can almost make a full fist she is 95% there  She has no signs of RSD  She is released to normal activity with an open appointment to call us if any problems or situations arise.  Shoulder has significantly improved and she is regained full range of motion

## 2020-11-25 ENCOUNTER — Encounter: Payer: Self-pay | Admitting: Emergency Medicine

## 2020-11-25 ENCOUNTER — Other Ambulatory Visit: Payer: Self-pay

## 2020-11-25 ENCOUNTER — Ambulatory Visit
Admission: EM | Admit: 2020-11-25 | Discharge: 2020-11-25 | Disposition: A | Discharge status: Home / Self Care | Payer: 59 | Attending: Emergency Medicine | Admitting: Emergency Medicine

## 2020-11-25 DIAGNOSIS — S61411A Laceration without foreign body of right hand, initial encounter: Secondary | ICD-10-CM | POA: Diagnosis not present

## 2020-11-25 DIAGNOSIS — Z23 Encounter for immunization: Secondary | ICD-10-CM | POA: Diagnosis not present

## 2020-11-25 DIAGNOSIS — W548XXA Other contact with dog, initial encounter: Secondary | ICD-10-CM

## 2020-11-25 MED ORDER — AMOXICILLIN-POT CLAVULANATE 875-125 MG PO TABS: 1.0000 | ORAL_TABLET | Freq: Two times a day (BID) | ORAL | 0 refills | Status: AC

## 2020-11-25 MED ORDER — TETANUS-DIPHTH-ACELL PERTUSSIS 5-2.5-18.5 LF-MCG/0.5 IM SUSY
0.5000 mL | PREFILLED_SYRINGE | Freq: Once | INTRAMUSCULAR | Status: AC
  Administered 2020-11-25: 0.5 mL via INTRAMUSCULAR

## 2020-11-25 NOTE — ED Triage Notes (Signed)
Cut by dog's toe nail on right hand. Bleeding controlled.

## 2020-11-25 NOTE — Discharge Instructions (Addendum)
Antibiotic prescribed to prevent infection Tetanus updated Keep covered for next and dry for next 24-48 hours.  Keep area covered with plastic bag.  DO NOT submerge wound in water. You may remove dermaclips at home within 7-10 days To remove device apply light oil to facilitate the removal and protect the skin.  To remove, the device should be peeled from one side of the closed wound or incision to the other so as to carefully remove it form the skin.  NEVER REMOVE THE DEVICE BY PULLING IT FROM THE SKIN AS DAMAGE TO THE HEALED WOUND OR INCISION AND REOPENING OF THE WOUND OR INCISION MAY RESULT.  PEEL DON'T PULL Follow up here or with PCP next week for recheck and to ensure wounds are healing Return sooner or go to the ED if you have any new or worsening symptoms such as increased pain, redness, swelling, drainage, discharge, decreased range of motion of extremity, etc..

## 2020-11-25 NOTE — ED Provider Notes (Signed)
[place]   [id number] 11/25/20 Arrival Time: 1228  CC: LACERATION  SUBJECTIVE:  Madison DISSINGER is a 60 y.o. female who presents with a laceration that occurred 1 hour ago.  Symptoms began after dog scratched RT hand.  Bleeding controlled.  Currently not on blood thinners.  Denies similar symptoms in the past.  Denies fever, chills, nausea, vomiting, redness, swelling, purulent drainage, decrease strength or sensation.   Td UTD: Unknown.  ROS: As per HPI.  All other pertinent ROS negative.     Past Medical History:  Diagnosis Date  . Constipation   . GERD (gastroesophageal reflux disease)   . PONV (postoperative nausea and vomiting)   . Ulcer of esophagus    Past Surgical History:  Procedure Laterality Date  . CERVICAL CONIZATION W/BX  1983  . COLONOSCOPY  06/27/2011   Procedure: COLONOSCOPY;  Surgeon: Rogene Houston, MD;  [facility];  Service: Endoscopy;  Laterality: N/A;  12:00  . ESOPHAGOGASTRODUODENOSCOPY N/A 08/10/2015   Procedure: ESOPHAGOGASTRODUODENOSCOPY (EGD);  Surgeon: Rogene Houston, MD;  [facility];  Service: Endoscopy;  Laterality: N/A;  1:00  . FLEXOR TENOTOMY Right 05/09/2020   Procedure: Tenotomy flexor tendons right hand;  Surgeon: Carole Civil, MD;  Location: [facility];  Service: Orthopedics;  Laterality: Right;  . OPEN REDUCTION INTERNAL FIXATION (ORIF) DISTAL RADIAL FRACTURE Right 03/03/2020   Procedure: OPEN REDUCTION INTERNAL FIXATION (ORIF) RIGHT WRIST FRACTURE;  Surgeon: Carole Civil, MD;  Location: [facility];  Service: Orthopedics;  Laterality: Right;   Allergies  Allergen Reactions  . Dextromethorphan     Drowsy, fatigue  . Gabapentin (Once-Daily)   . Tape     Made skin peel   No current facility-administered medications on file prior to encounter.   Current Outpatient Medications on File Prior to Encounter  Medication Sig Dispense Refill  . acidophilus (RISAQUAD) CAPS capsule Take 1 capsule by  mouth daily.    Marland Kitchen ascorbic acid (VITAMIN C) 500 MG tablet Take 500 mg by mouth daily.    . calcium carbonate (OSCAL) 1500 (600 Ca) MG TABS tablet Take 600 mg of elemental calcium by mouth daily with breakfast.    . Cholecalciferol (VITAMIN D) 125 MCG (5000 UT) CAPS Take 5,000 Units by mouth daily.     . cloNIDine (CATAPRES) 0.1 MG tablet Take 1 tablet (0.1 mg total) by mouth daily. 30 tablet 0  . docusate sodium (COLACE) 100 MG capsule Take 100 mg by mouth daily.    Marland Kitchen estradiol (ESTRACE) 2 MG tablet Take 2 mg by mouth daily.    Marland Kitchen HYDROcodone-acetaminophen (NORCO/VICODIN) 5-325 MG tablet Take 1 tablet by mouth every 6 (six) hours as needed for moderate pain. (Patient not taking: Reported on 09/14/2020) 30 tablet 0  . ibuprofen (ADVIL) 100 MG/5ML suspension Take 200 mg by mouth every 6 (six) hours as needed.    . Multiple Vitamin (MULTI-VITAMIN PO) Take 1 tablet by mouth daily.     . progesterone (PROMETRIUM) 100 MG capsule Take 100 mg by mouth daily.     Social History   Socioeconomic History  . Marital status: Married    Spouse name: Not on file  . Number of children: Not on file  . Years of education: Not on file  . Highest education level: Not on file  Occupational History  . Not on file  Tobacco Use  . Smoking status: Current Every Day Smoker    Packs/day: 1.00    Years: 30.00  Pack years: 30.00    Types: Cigarettes  . Smokeless tobacco: Never Used  . Tobacco comment: 1 pack a day  Vaping Use  . Vaping Use: Never used  Substance and Sexual Activity  . Alcohol use: No    Alcohol/week: 0.0 standard drinks  . Drug use: No  . Sexual activity: Not on file  Other Topics Concern  . Not on file  Social History Narrative  . Not on file   Social Determinants of Health   Financial Resource Strain: Not on file  Food Insecurity: Not on file  Transportation Needs: Not on file  Physical Activity: Not on file  Stress: Not on file  Social Connections: Not on file  Intimate Partner  Violence: Not on file   Family History  Problem Relation Age of Onset  . CVA Mother   . Esophageal varices Sister   . Hiatal hernia Sister   . Colon cancer Maternal Uncle      OBJECTIVE:  Vitals:   11/25/20 1301  BP: (!) 122/59  Pulse: 77  Resp: 18  Temp: 98.3 F (36.8 C)  TempSrc: Oral  SpO2: 96%     General appearance: alert; no distress Skin: laceration of dorsal aspect of RT hand over 2nd MC; size: approx 2 cm Psychological: alert and cooperative; normal mood and affect  Procedure: Verbal consent obtained. Patient provided with risks and alternatives to the procedure. Wound copiously irrigated with tap water. Dermaclip applied.  Patient tolerated procedure well. No complications. Minimal bleeding. Patient advised to look for and return for any signs of infection such as redness, swelling, discharge, or worsening pain.    ASSESSMENT & PLAN:  1. Laceration of skin of right hand, initial encounter   2. Dog scratch     Meds ordered this encounter  Medications  . amoxicillin-clavulanate (AUGMENTIN) 875-125 MG tablet    Sig: Take 1 tablet by mouth every 12 (twelve) hours for 10 days.    Dispense:  20 tablet    Refill:  0    Order Specific Question:   Supervising Provider    Answer:   Raylene Everts [[id number]]  . Tdap (BOOSTRIX) injection 0.5 mL   Antibiotic prescribed to prevent infection Tetanus updated Keep covered for next and dry for next 24-48 hours.  Keep area covered with plastic bag.  DO NOT submerge wound in water. You may remove dermaclips at home within 7-10 days To remove device apply light oil to facilitate the removal and protect the skin.  To remove, the device should be peeled from one side of the closed wound or incision to the other so as to carefully remove it form the skin.  NEVER REMOVE THE DEVICE BY PULLING IT FROM THE SKIN AS DAMAGE TO THE HEALED WOUND OR INCISION AND REOPENING OF THE WOUND OR INCISION MAY RESULT.  PEEL DON'T PULL Follow up  here or with PCP next week for recheck and to ensure wounds are healing Return sooner or go to the ED if you have any new or worsening symptoms such as increased pain, redness, swelling, drainage, discharge, decreased range of motion of extremity, etc..      Reviewed expectations re: course of current medical issues. Questions answered. Outlined signs and symptoms indicating need for more acute intervention. Patient verbalized understanding. After Visit Summary given.   Lestine Box, PA-C 11/25/20 1323

## 2021-04-23 ENCOUNTER — Ambulatory Visit: Payer: 59

## 2021-04-23 ENCOUNTER — Ambulatory Visit (INDEPENDENT_AMBULATORY_CARE_PROVIDER_SITE_OTHER): Payer: 59 | Admitting: Orthopedic Surgery

## 2021-04-23 ENCOUNTER — Encounter: Payer: Self-pay | Admitting: Orthopedic Surgery

## 2021-04-23 ENCOUNTER — Other Ambulatory Visit: Payer: Self-pay

## 2021-04-23 VITALS — BP 118/82 | HR 81 | Ht 62.0 in | Wt 127.4 lb

## 2021-04-23 DIAGNOSIS — M25532 Pain in left wrist: Secondary | ICD-10-CM

## 2021-04-23 DIAGNOSIS — S52532A Colles' fracture of left radius, initial encounter for closed fracture: Secondary | ICD-10-CM

## 2021-04-23 NOTE — Progress Notes (Signed)
Patient ID: Madison Walsh, female   DOB: [birth year], 60 y.o.   MRN: [id number]  ASSESSMENT AND PLAN  Delayed presentation of a left distal radius fracture  Patient advised about the injury.  She was placed in a brace x-ray in 4 weeks  Chief Complaint  Patient presents with   Wrist Pain    Left wrist/fell 03/07/21     HPI Madison Walsh is a 60 y.o. female.   Madison Walsh 60 she fell 6 weeks ago injured her left wrist thought it was sprained presents now with continued pain over the distal radius and prominence of the distal ulna   Review of Systems Review of Systems  Skin intact Left upper extremity nerve function intact   Past Medical History:  Diagnosis Date   Constipation    GERD (gastroesophageal reflux disease)    PONV (postoperative nausea and vomiting)    Ulcer of esophagus     Past Surgical History:  Procedure Laterality Date   CERVICAL CONIZATION W/BX  1983   COLONOSCOPY  06/27/2011   Procedure: COLONOSCOPY;  Surgeon: Rogene Houston, MD;  [facility];  Service: Endoscopy;  Laterality: N/A;  12:00   ESOPHAGOGASTRODUODENOSCOPY N/A 08/10/2015   Procedure: ESOPHAGOGASTRODUODENOSCOPY (EGD);  Surgeon: Rogene Houston, MD;  [facility];  Service: Endoscopy;  Laterality: N/A;  1:00   FLEXOR TENOTOMY  Right 05/09/2020   Procedure: Tenotomy flexor tendons right hand;  Surgeon: Carole Civil, MD;  Location: [facility];  Service: Orthopedics;  Laterality: Right;   OPEN REDUCTION INTERNAL FIXATION (ORIF) DISTAL RADIAL FRACTURE Right 03/03/2020   Procedure: OPEN REDUCTION INTERNAL FIXATION (ORIF) RIGHT WRIST FRACTURE;  Surgeon: Carole Civil, MD;  Location: [facility];  Service: Orthopedics;  Laterality: Right;      Physical Exam 1 BP 118/82   Pulse 81   Ht '5\' 2"'$  (1.575 m)   Wt 127 lb 6.4 oz (57.8 kg)   LMP 06/21/2011   BMI 23.30 kg/m   2 The patient is well developed well nourished and well groomed. 3 Orientation to person place and time is  normal  4 Mood is pleasant.  5 Ambulatory status normal  6 Inspection of the left wrist reveals mild tenderness   mild swelling prominent ulnar styloid deformity 7 Range of motion assessment: The range of motion is diminished primarily secondary to pain 8 Stability tests are deferred because of pain but the x-ray shows no subluxation of the joint 9 Strength assessment muscle tone is normal resistance testing is deferred because of pain and swelling  10 Nerve function normal 11 Vascular function normal 12 Local lymphatic system normal MEDICAL DECISION MAKING  A.  Encounter Diagnosis  Name Primary?   Pain in left wrist Yes    B. DATA ANALYSED:  X-rays of the wrist IMAGING: X-rays were done in the office x-rays show a subacute fracture of the distal radius with prominence of the ulna   Orders: None  Outside records reviewed: None  C. MANAGEMENT splint x-ray in 4 weeks  No orders of the defined types were placed in this encounter.

## 2021-05-07 ENCOUNTER — Other Ambulatory Visit: Payer: Self-pay | Admitting: Orthopedic Surgery

## 2021-05-07 DIAGNOSIS — G90511 Complex regional pain syndrome I of right upper limb: Secondary | ICD-10-CM

## 2021-05-13 ENCOUNTER — Ambulatory Visit
Admission: EM | Admit: 2021-05-13 | Discharge: 2021-05-13 | Disposition: A | Discharge status: Home / Self Care | Payer: 59 | Attending: Internal Medicine | Admitting: Internal Medicine

## 2021-05-13 ENCOUNTER — Other Ambulatory Visit: Payer: Self-pay

## 2021-05-13 ENCOUNTER — Encounter: Payer: Self-pay | Admitting: Emergency Medicine

## 2021-05-13 DIAGNOSIS — S91312A Laceration without foreign body, left foot, initial encounter: Secondary | ICD-10-CM

## 2021-05-13 MED ORDER — BACITRACIN ZINC 500 UNIT/GM EX OINT: 1.0000 "application " | TOPICAL_OINTMENT | Freq: Two times a day (BID) | CUTANEOUS | 0 refills | Status: AC

## 2021-05-13 NOTE — Discharge Instructions (Addendum)
Daily wound dressing changes with antibiotic ointment Okay for the wound to come into contact with mild soap and water 48 to 72 hours after suturing If you notice any redness, purulent discharge, swelling please return to urgent care to be reevaluated Return to urgent care
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
## 2021-05-18 ENCOUNTER — Ambulatory Visit (INDEPENDENT_AMBULATORY_CARE_PROVIDER_SITE_OTHER): Payer: 59

## 2021-05-18 ENCOUNTER — Ambulatory Visit
Admission: EM | Admit: 2021-05-18 | Discharge: 2021-05-18 | Disposition: A | Discharge status: Home / Self Care | Payer: 59 | Attending: Emergency Medicine | Admitting: Emergency Medicine

## 2021-05-18 ENCOUNTER — Other Ambulatory Visit: Payer: Self-pay

## 2021-05-18 ENCOUNTER — Encounter: Payer: Self-pay | Admitting: Emergency Medicine

## 2021-05-18 DIAGNOSIS — R0789 Other chest pain: Secondary | ICD-10-CM | POA: Diagnosis not present

## 2021-05-18 MED ORDER — PREDNISONE 20 MG PO TABS: 20.0000 mg | ORAL_TABLET | Freq: Two times a day (BID) | ORAL | 0 refills | Status: AC
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
## 2021-05-18 NOTE — Discharge Instructions (Signed)
X-rays negative for fracture Symptoms are mostly likely related to soft tissue injury Rest, ice and heat as needed Prescribed prednisone for pain and inflammation It may take 3-4 weeks for complete resolution of symptoms Will f/u with her doctor next week for recheck Return here or go to ER if you have any new or worsening symptoms such as numbness/tingling of the inner thighs, loss of bladder or bowel control, headache/blurry vision, nausea/vomiting, confusion/altered mental status, dizziness, weakness, passing out, imbalance,

## 2021-05-21 ENCOUNTER — Ambulatory Visit: Payer: 59

## 2021-05-21 ENCOUNTER — Other Ambulatory Visit: Payer: Self-pay

## 2021-05-21 ENCOUNTER — Ambulatory Visit (INDEPENDENT_AMBULATORY_CARE_PROVIDER_SITE_OTHER): Payer: 59 | Admitting: Orthopedic Surgery

## 2021-05-21 DIAGNOSIS — S52532D Colles' fracture of left radius, subsequent encounter for closed fracture with routine healing: Secondary | ICD-10-CM

## 2021-05-21 DIAGNOSIS — S52532A Colles' fracture of left radius, initial encounter for closed fracture: Secondary | ICD-10-CM | POA: Insufficient documentation
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
## 2021-05-23 ENCOUNTER — Ambulatory Visit (INDEPENDENT_AMBULATORY_CARE_PROVIDER_SITE_OTHER): Payer: 59

## 2021-05-23 ENCOUNTER — Other Ambulatory Visit: Payer: Self-pay

## 2021-05-23 ENCOUNTER — Ambulatory Visit
Admission: RE | Admit: 2021-05-23 | Discharge: 2021-05-23 | Disposition: A | Discharge status: Home / Self Care | Payer: 59 | Source: Ambulatory Visit | Attending: Family Medicine | Admitting: Family Medicine

## 2021-05-23 VITALS — BP 143/79 | HR 91 | Temp 98.4°F | Resp 20

## 2021-05-23 DIAGNOSIS — R0789 Other chest pain: Secondary | ICD-10-CM

## 2021-05-23 DIAGNOSIS — R0602 Shortness of breath: Secondary | ICD-10-CM
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
## 2021-07-09 IMAGING — RF DG WRIST 2V*R*
1 series · 8 of 8 positions shown · non-contrast
Comparison: 02/26/2020.

CLINICAL DATA: ORIF.

EXAM:
RIGHT WRIST - 2 VIEW; DG C-ARM 1-60 MIN

[Series 1: run · 8 of 8 slices shown]
[im 1/8]
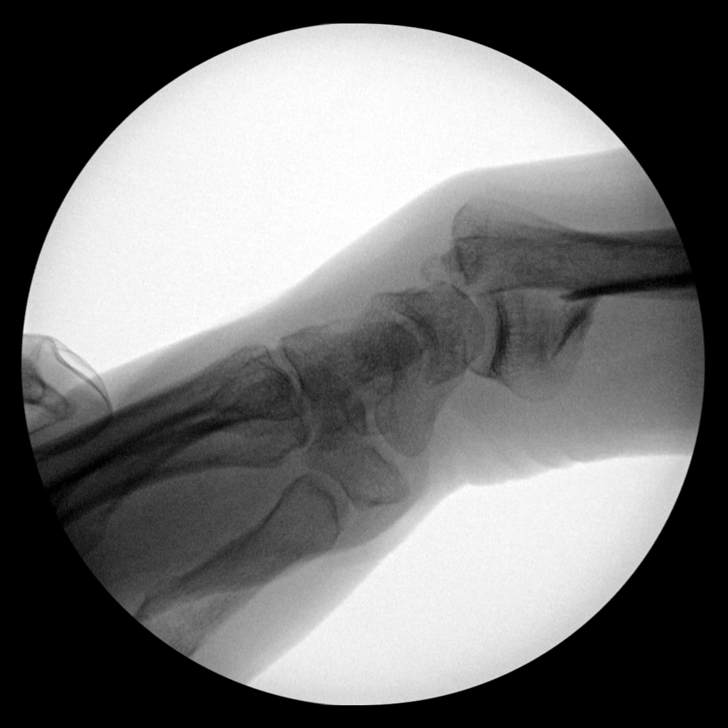
[im 2/8]
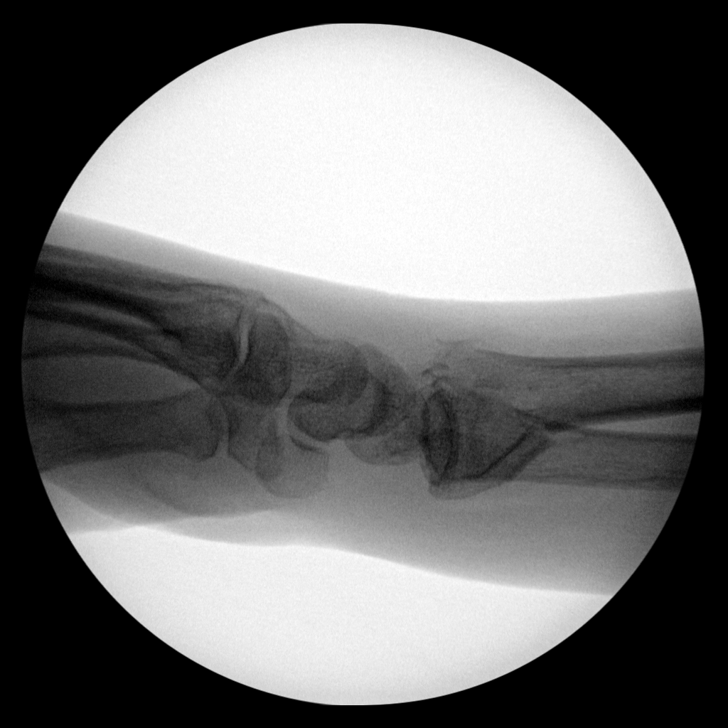
[im 3/8]
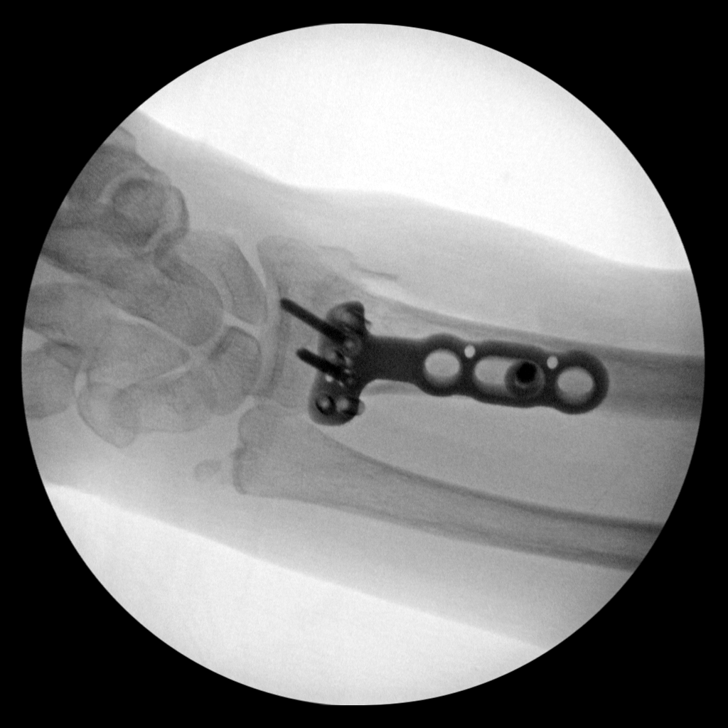
[im 4/8]
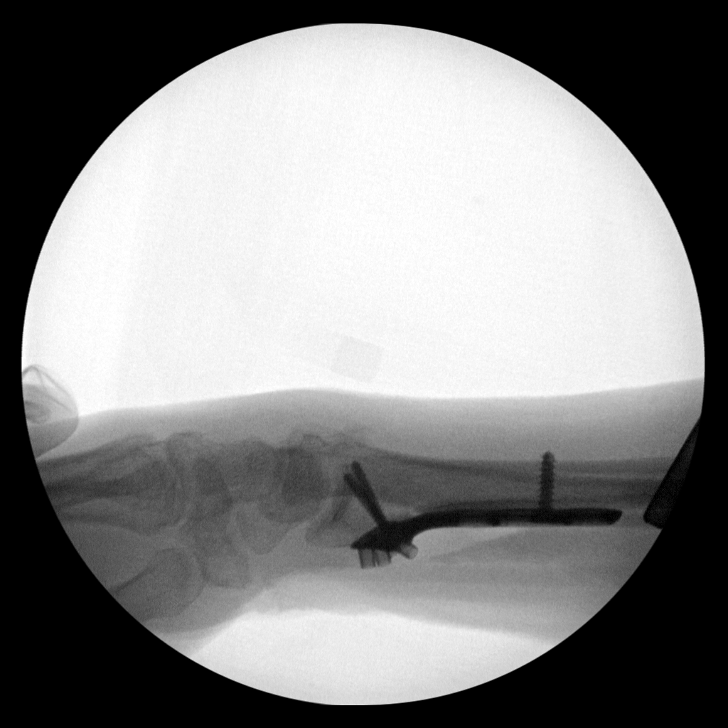
[im 5/8]
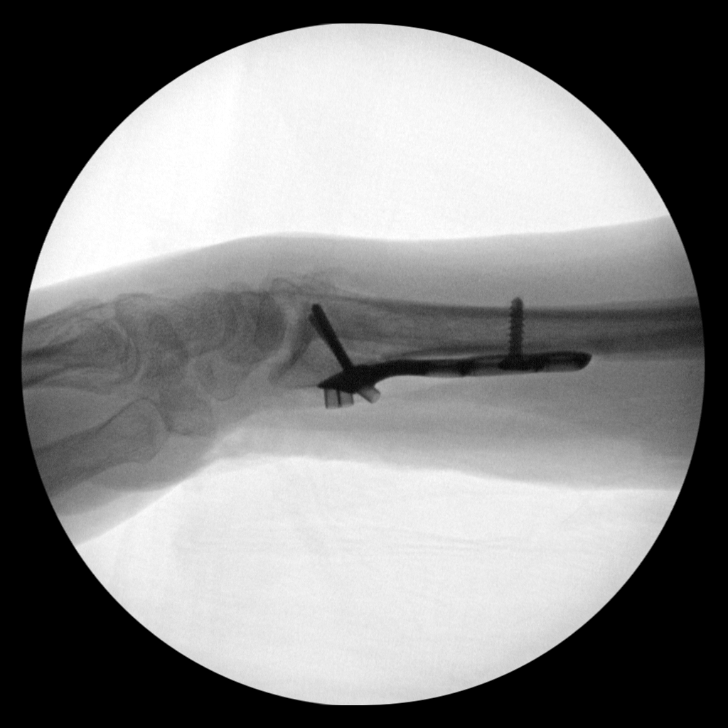
[im 6/8]
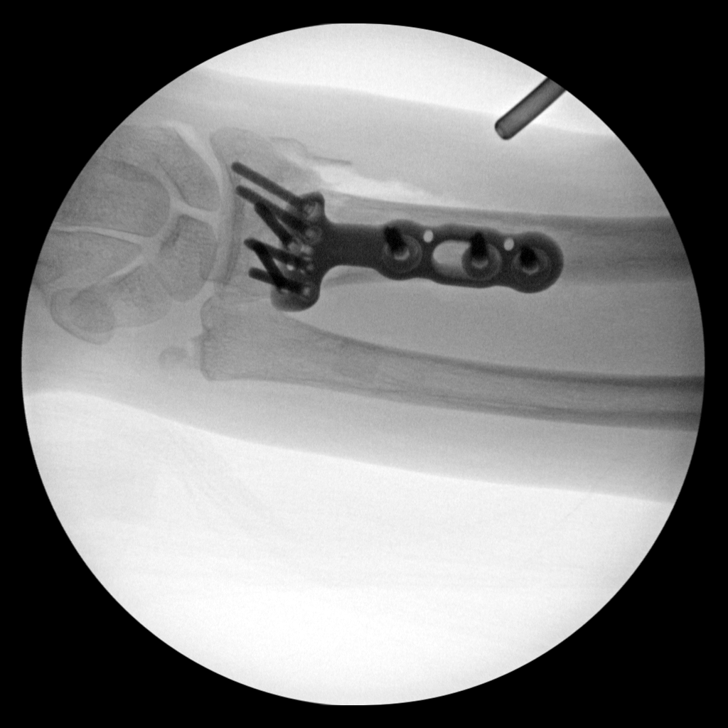
[im 7/8]
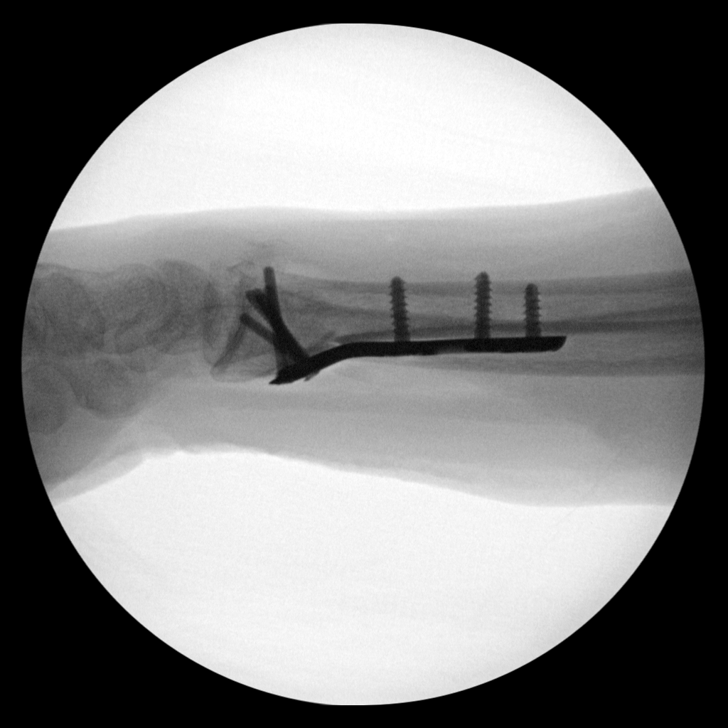
[im 8/8]
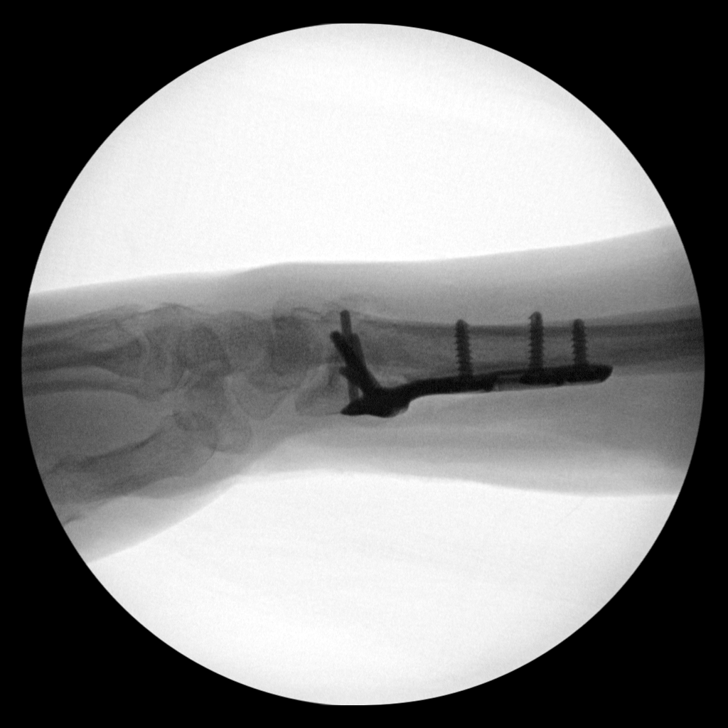

[8 of 8 positions shown; findings below may reference images not displayed]

FINDINGS: Plate and screw fixation distal right radial fracture. Hardware
intact. Anatomic alignment. Ulnar styloid fracture appears to be
present.
IMPRESSION: Plate and screw fixation of distal right radial fracture. Hardware
intact. Anatomic alignment.

## 2021-09-11 ENCOUNTER — Other Ambulatory Visit: Payer: Self-pay | Admitting: Orthopedic Surgery

## 2021-09-11 DIAGNOSIS — G90511 Complex regional pain syndrome I of right upper limb: Secondary | ICD-10-CM

## 2021-10-08 ENCOUNTER — Other Ambulatory Visit: Payer: Self-pay | Admitting: Physician Assistant

## 2021-10-08 ENCOUNTER — Other Ambulatory Visit (HOSPITAL_COMMUNITY): Payer: Self-pay | Admitting: Physician Assistant

## 2021-10-08 DIAGNOSIS — F17200 Nicotine dependence, unspecified, uncomplicated: Secondary | ICD-10-CM

## 2021-11-16 ENCOUNTER — Other Ambulatory Visit: Payer: Self-pay

## 2021-11-16 ENCOUNTER — Ambulatory Visit (HOSPITAL_COMMUNITY)
Admission: RE | Admit: 2021-11-16 | Discharge: 2021-11-16 | Disposition: A | Discharge status: Home / Self Care | Payer: 59 | Source: Ambulatory Visit | Attending: Physician Assistant | Admitting: Physician Assistant

## 2021-11-16 DIAGNOSIS — F17200 Nicotine dependence, unspecified, uncomplicated: Secondary | ICD-10-CM | POA: Diagnosis present

## 2021-12-11 ENCOUNTER — Encounter: Payer: Self-pay | Admitting: Emergency Medicine

## 2021-12-11 ENCOUNTER — Other Ambulatory Visit: Payer: Self-pay

## 2021-12-11 ENCOUNTER — Ambulatory Visit
Admission: EM | Admit: 2021-12-11 | Discharge: 2021-12-11 | Disposition: A | Discharge status: Home / Self Care | Payer: 59 | Attending: Family Medicine | Admitting: Family Medicine

## 2021-12-11 DIAGNOSIS — H1033 Unspecified acute conjunctivitis, bilateral: Secondary | ICD-10-CM

## 2021-12-11 DIAGNOSIS — J0100 Acute maxillary sinusitis, unspecified: Secondary | ICD-10-CM

## 2021-12-11 MED ORDER — AMOXICILLIN-POT CLAVULANATE 875-125 MG PO TABS: 1.0000 | ORAL_TABLET | Freq: Two times a day (BID) | ORAL | 0 refills | Status: AC

## 2021-12-11 MED ORDER — OFLOXACIN 0.3 % OP SOLN: 1.0000 [drp] | OPHTHALMIC | 0 refills | Status: AC

## 2021-12-11 NOTE — ED Provider Notes (Signed)
?[place] ? ? ? ?CSN: [id number] ?Arrival date & time: 12/11/21  1349 ? ? ?  ? ?History   ?Chief Complaint ?Chief Complaint  ?Patient presents with  ? Eye Drainage  ? ? ?HPI ?Madison Walsh is a 61 y.o. female.  ? ?Presenting today with half weeks of progressively worsening bilateral eye redness, drainage, irritation, cough, sinus pain and pressure, nasal congestion, sore throat.  Taking antihistamines, allergy eyedrops, Mucinex, Delsym, Tessalon Perles with minimal relief.  Has recently had some fevers off and on, low-grade.  No known sick contacts recently.  Home COVID test has been negative. ? ? ?Past Medical History:  ?Diagnosis Date  ? Constipation   ? GERD (gastroesophageal reflux disease)   ? PONV (postoperative nausea and vomiting)   ? Ulcer of esophagus   ? ? ?Patient Active Problem List  ? Diagnosis Date Noted  ? Fracture, [place], left, closed 05/21/2021  ? S/P carpal tunnel release and right hand flexor tenodesis multiple on 05/09/20 05/09/2020  ? Contracture of joint of right forearm   ? Complex regional pain syndrome i of right lower limb 04/28/2020  ? S/P ORIF (open reduction internal fixation) fracture right wrist 03/03/20 03/07/2020  ? Closed Barton's fracture of right radius   ? Kyphosis of thoracic region 10/03/2017  ? Erosive esophagitis 03/08/2013  ? Unspecified constipation 03/08/2013  ? ? ?Past Surgical History:  ?Procedure Laterality Date  ? CERVICAL CONIZATION W/BX  1983  ? COLONOSCOPY  06/27/2011  ? Procedure: COLONOSCOPY;  Surgeon: Rogene Houston, MD;  Location: [facility];  Service: Endoscopy;  Laterality: N/A;  12:00  ? ESOPHAGOGASTRODUODENOSCOPY N/A 08/10/2015  ? Procedure: ESOPHAGOGASTRODUODENOSCOPY (EGD);  Surgeon: Rogene Houston, MD;  Location: [facility];  Service: Endoscopy;  Laterality: N/A;  1:00  ? FLEXOR TENOTOMY  Right 05/09/2020  ? Procedure: Tenotomy flexor tendons right hand;  Surgeon: Carole Civil, MD;  Location: [facility];  Service: Orthopedics;   Laterality: Right;  ? OPEN REDUCTION INTERNAL FIXATION (ORIF) DISTAL RADIAL FRACTURE Right 03/03/2020  ? Procedure: OPEN REDUCTION INTERNAL FIXATION (ORIF) RIGHT WRIST FRACTURE;  Surgeon: Carole Civil, MD;  Location: [facility];  Service: Orthopedics;  Laterality: Right;  ? ? ?OB History   ?No obstetric history on file. ?  ? ? ? ?Home Medications   ? ?Prior to Admission medications   ?Medication Sig Start Date End Date Taking? Authorizing Provider  ?amoxicillin-clavulanate (AUGMENTIN) 875-125 MG tablet Take 1 tablet by mouth every 12 (twelve) hours. 12/11/21  Yes Volney American, PA-C  ?ofloxacin (OCUFLOX) 0.3 % ophthalmic solution Place 1 drop into both eyes every 4 (four) hours. 12/11/21  Yes Volney American, PA-C  ?acidophilus (RISAQUAD) CAPS capsule Take 1 capsule by mouth daily.    [provider]  ?ascorbic acid (VITAMIN C) 500 MG tablet Take 500 mg by mouth daily.    [provider]  ?bacitracin ointment Apply 1 application topically 2 (two) times daily. 05/13/21   LampteyMyrene Galas, MD  ?calcium carbonate (OSCAL) 1500 (600 Ca) MG TABS tablet Take 600 mg of elemental calcium by mouth daily with breakfast.    [provider]  ?Cholecalciferol (VITAMIN D) 125 MCG (5000 UT) CAPS Take 5,000 Units by mouth daily.     [provider]  ?cloNIDine (CATAPRES) 0.1 MG tablet TAKE 1 TABLET BY MOUTH EVERY NIGHT AS DIRECTED 09/11/21   Carole Civil, MD  ?docusate sodium (COLACE) 100 MG capsule Take 100 mg by mouth daily.  [provider]  ?estradiol (ESTRACE) 2 MG tablet Take 2 mg by mouth daily. 02/10/20   [provider]  ?ibuprofen (ADVIL) 200 MG tablet Take 200 mg by mouth every 6 (six) hours as needed.    [provider]  ?Multiple Vitamin (MULTI-VITAMIN PO) Take 1 tablet by mouth daily.     [provider]  ?progesterone (PROMETRIUM) 100 MG capsule Take 100 mg by mouth daily. 02/24/20   [provider]  ? ? ?Family  History ?Family History  ?Problem Relation Age of Onset  ? CVA Mother   ? Esophageal varices Sister   ? Hiatal hernia Sister   ? Colon cancer Maternal Uncle   ? ? ?Social History ?Social History  ? ?Tobacco Use  ? Smoking status: Every Day  ?  Packs/day: 1.00  ?  Years: 30.00  ?  Pack years: 30.00  ?  Types: Cigarettes  ? Smokeless tobacco: Never  ? Tobacco comments:  ?  1 pack a day  ?Vaping Use  ? Vaping Use: Never used  ?Substance Use Topics  ? Alcohol use: No  ?  Alcohol/week: 0.0 standard drinks  ? Drug use: No  ? ? ? ?Allergies   ?Dextromethorphan, Gabapentin (once-daily), and Tape ? ? ?Review of Systems ?Review of Systems ?Per HPI ? ?Physical Exam ?Triage Vital Signs ?ED Triage Vitals  ?Enc Vitals Group  ?   BP 12/11/21 1559 111/77  ?   Pulse Rate 12/11/21 1559 (!) 104  ?   Resp 12/11/21 1559 18  ?   Temp 12/11/21 1559 99.6 ?F (37.6 ?C)  ?   Temp Source 12/11/21 1559 Oral  ?   SpO2 12/11/21 1559 94 %  ?   Weight 12/11/21 1600 130 lb (59 kg)  ?   Height 12/11/21 1600 '5\' 2"'$  (1.575 m)  ?   Head Circumference --   ?   Peak Flow --   ?   Pain Score 12/11/21 1559 8  ?   Pain Loc --   ?   Pain Edu? --   ?   Excl. in [place]? --   ? ?No data found. ? ?Updated Vital Signs ?BP 111/77 (BP Location: Right Arm)   Pulse (!) 104   Temp 99.6 ?F (37.6 ?C) (Oral)   Resp 18   Ht '5\' 2"'$  (1.575 m)   Wt 130 lb (59 kg)   LMP 06/21/2011   SpO2 94%   BMI 23.78 kg/m?  ? ?Visual Acuity ?Right Eye Distance:   ?Left Eye Distance:   ?Bilateral Distance:   ? ?Right Eye Near:   ?Left Eye Near:    ?Bilateral Near:    ? ?Physical Exam ?Vitals and nursing note reviewed.  ?Constitutional:   ?   Appearance: Normal appearance. She is not ill-appearing.  ?HENT:  ?   Head: Atraumatic.  ?   Right Ear: Tympanic membrane and external ear normal.  ?   Left Ear: Tympanic membrane and external ear normal.  ?   Nose: Congestion present.  ?   Mouth/Throat:  ?   Mouth: Mucous membranes are moist.  ?   Pharynx: Posterior oropharyngeal erythema present.   ?Eyes:  ?   Extraocular Movements: Extraocular movements intact.  ?   Pupils: Pupils are equal, round, and reactive to light.  ?   Comments: Bilateral conjunctival erythema, edema, drainage  ?Cardiovascular:  ?   Rate and Rhythm: Normal rate and regular rhythm.  ?   Heart sounds: Normal heart sounds.  ?  Pulmonary:  ?   Effort: Pulmonary effort is normal.  ?   Breath sounds: Normal breath sounds. No wheezing.  ?Musculoskeletal:     ?   General: Normal range of motion.  ?   Cervical back: Normal range of motion and neck supple.  ?Skin: ?   General: Skin is warm and dry.  ?Neurological:  ?   Mental Status: She is alert and oriented to person, place, and time.  ?Psychiatric:     ?   Mood and Affect: Mood normal.     ?   Thought Content: Thought content normal.     ?   Judgment: Judgment normal.  ? ? ? ?UC Treatments / Results  ?Labs ?(all labs ordered are listed, but only abnormal results are displayed) ?Labs Reviewed - No data to display ? ?EKG ? ? ?Radiology ?No results found. ? ?Procedures ?Procedures (including critical care time) ? ?Medications Ordered in UC ?Medications - No data to display ? ?Initial Impression / Assessment and Plan / UC Course  ?I have reviewed the triage vital signs and the nursing notes. ? ?Pertinent labs & imaging results that were available during my care of the patient were reviewed by me and considered in my medical decision making (see chart for details). ? ?  ? ?Treat with Augmentin, ofloxacin drops, warm compresses, good hand hygiene, supportive over-the-counter medications and home care.  Return for any acutely worsening symptoms. ? ?Final Clinical Impressions(s) / UC Diagnoses  ? ?Final diagnoses:  ?Acute non-recurrent maxillary sinusitis  ?Acute bacterial conjunctivitis of both eyes  ? ?Discharge Instructions   ?None ?  ? ?ED Prescriptions   ? ? Medication Sig Dispense Auth. Provider  ? amoxicillin-clavulanate (AUGMENTIN) 875-125 MG tablet Take 1 tablet by mouth every 12 (twelve)  hours. 14 tablet Volney American, [place]  ? ofloxacin (OCUFLOX) 0.3 % ophthalmic solution Place 1 drop into both eyes every 4 (four) hours. 5 mL Volney American, [place]  ? ?  ? ?PDMP not reviewed this encounter. ?  ?Maxie Better

## 2021-12-11 NOTE — ED Triage Notes (Addendum)
Pt reports left eye drainage and redness that is now in left and also reports cough, sore throat, nasal congestion for last several days. Pt reports is currently taking mucinex and allergy meds with no change in symptoms. ? ?Home covid test was negative on Thursday. ?

## 2022-01-08 ENCOUNTER — Other Ambulatory Visit: Payer: Self-pay | Admitting: Orthopedic Surgery

## 2022-01-08 DIAGNOSIS — G90511 Complex regional pain syndrome I of right upper limb: Secondary | ICD-10-CM

## 2022-02-28 ENCOUNTER — Other Ambulatory Visit: Payer: Self-pay | Admitting: Obstetrics and Gynecology

## 2022-02-28 DIAGNOSIS — R928 Other abnormal and inconclusive findings on diagnostic imaging of breast: Secondary | ICD-10-CM

## 2022-03-06 ENCOUNTER — Ambulatory Visit
Admission: RE | Admit: 2022-03-06 | Discharge: 2022-03-06 | Disposition: A | Discharge status: Home / Self Care | Payer: 59 | Source: Ambulatory Visit | Attending: Obstetrics and Gynecology | Admitting: Obstetrics and Gynecology

## 2022-03-06 DIAGNOSIS — R928 Other abnormal and inconclusive findings on diagnostic imaging of breast: Secondary | ICD-10-CM

## 2022-03-27 ENCOUNTER — Encounter (INDEPENDENT_AMBULATORY_CARE_PROVIDER_SITE_OTHER): Payer: Self-pay | Admitting: *Deleted

## 2022-05-16 ENCOUNTER — Other Ambulatory Visit: Payer: Self-pay | Admitting: Orthopedic Surgery

## 2022-05-16 DIAGNOSIS — G90511 Complex regional pain syndrome I of right upper limb: Secondary | ICD-10-CM

## 2022-09-17 ENCOUNTER — Encounter (INDEPENDENT_AMBULATORY_CARE_PROVIDER_SITE_OTHER): Payer: Self-pay | Admitting: *Deleted

## 2022-11-07 ENCOUNTER — Encounter: Payer: Self-pay | Admitting: Radiology

## 2023-05-21 ENCOUNTER — Other Ambulatory Visit (HOSPITAL_COMMUNITY): Payer: Self-pay | Admitting: Family Medicine

## 2023-05-21 ENCOUNTER — Ambulatory Visit (HOSPITAL_COMMUNITY)
Admission: RE | Admit: 2023-05-21 | Discharge: 2023-05-21 | Disposition: A | Discharge status: Home / Self Care | Payer: Managed Care, Other (non HMO) | Source: Ambulatory Visit | Attending: Family Medicine | Admitting: Family Medicine

## 2023-05-21 DIAGNOSIS — J4489 Other specified chronic obstructive pulmonary disease: Secondary | ICD-10-CM

## 2023-05-27 ENCOUNTER — Encounter (INDEPENDENT_AMBULATORY_CARE_PROVIDER_SITE_OTHER): Payer: Self-pay | Admitting: *Deleted

## 2023-11-26 ENCOUNTER — Encounter (INDEPENDENT_AMBULATORY_CARE_PROVIDER_SITE_OTHER): Payer: Self-pay | Admitting: *Deleted

## 2024-03-30 DIAGNOSIS — Z124 Encounter for screening for malignant neoplasm of cervix: Secondary | ICD-10-CM | POA: Diagnosis not present

## 2024-03-30 DIAGNOSIS — Z6820 Body mass index (BMI) 20.0-20.9, adult: Secondary | ICD-10-CM | POA: Diagnosis not present

## 2024-03-30 DIAGNOSIS — Z01419 Encounter for gynecological examination (general) (routine) without abnormal findings: Secondary | ICD-10-CM | POA: Diagnosis not present

## 2024-03-30 DIAGNOSIS — Z1231 Encounter for screening mammogram for malignant neoplasm of breast: Secondary | ICD-10-CM | POA: Diagnosis not present

## 2024-05-03 DIAGNOSIS — H9313 Tinnitus, bilateral: Secondary | ICD-10-CM | POA: Diagnosis not present

## 2024-05-21 DIAGNOSIS — H9313 Tinnitus, bilateral: Secondary | ICD-10-CM | POA: Diagnosis not present

## 2024-05-21 DIAGNOSIS — H903 Sensorineural hearing loss, bilateral: Secondary | ICD-10-CM | POA: Diagnosis not present
